# Patient Record
Sex: Female | Born: 1955
Health system: Southern US, Community
[De-identification: ages and names within clinical notes are randomized; demographics above are authoritative.]

## PROBLEM LIST (undated history)

## (undated) DIAGNOSIS — R112 Nausea with vomiting, unspecified: Secondary | ICD-10-CM

## (undated) DIAGNOSIS — Z9889 Other specified postprocedural states: Secondary | ICD-10-CM

## (undated) DIAGNOSIS — F329 Major depressive disorder, single episode, unspecified: Secondary | ICD-10-CM

## (undated) DIAGNOSIS — R519 Headache, unspecified: Secondary | ICD-10-CM

## (undated) DIAGNOSIS — I10 Essential (primary) hypertension: Secondary | ICD-10-CM

## (undated) DIAGNOSIS — F32A Depression, unspecified: Secondary | ICD-10-CM

## (undated) DIAGNOSIS — E119 Type 2 diabetes mellitus without complications: Secondary | ICD-10-CM

## (undated) DIAGNOSIS — G4730 Sleep apnea, unspecified: Secondary | ICD-10-CM

## (undated) DIAGNOSIS — K219 Gastro-esophageal reflux disease without esophagitis: Secondary | ICD-10-CM

## (undated) DIAGNOSIS — D649 Anemia, unspecified: Secondary | ICD-10-CM

## (undated) DIAGNOSIS — M1990 Unspecified osteoarthritis, unspecified site: Secondary | ICD-10-CM

## (undated) DIAGNOSIS — F419 Anxiety disorder, unspecified: Secondary | ICD-10-CM

## (undated) DIAGNOSIS — T8859XA Other complications of anesthesia, initial encounter: Secondary | ICD-10-CM

## (undated) HISTORY — DX: Essential (primary) hypertension: I10

## (undated) HISTORY — PX: BREAST REDUCTION SURGERY: SHX8

## (undated) HISTORY — PX: COLONOSCOPY: SHX174

## (undated) HISTORY — DX: Type 2 diabetes mellitus without complications: E11.9

## (undated) HISTORY — DX: Sleep apnea, unspecified: G47.30

## (undated) HISTORY — PX: COSMETIC SURGERY: SHX468

## (undated) HISTORY — PX: LAPAROSCOPY: SHX197

## (undated) HISTORY — DX: Depression, unspecified: F32.A

## (undated) HISTORY — DX: Major depressive disorder, single episode, unspecified: F32.9

## (undated) HISTORY — PX: TUBAL LIGATION: SHX77

---

## 1989-01-28 HISTORY — PX: REDUCTION MAMMAPLASTY: SUR839

## 2001-05-14 ENCOUNTER — Other Ambulatory Visit: Admission: RE | Admit: 2001-05-14 | Discharge: 2001-05-14 | Payer: Self-pay | Admitting: Obstetrics and Gynecology

## 2002-05-17 ENCOUNTER — Other Ambulatory Visit: Admission: RE | Admit: 2002-05-17 | Discharge: 2002-05-17 | Payer: Self-pay | Admitting: Obstetrics and Gynecology

## 2003-05-26 ENCOUNTER — Other Ambulatory Visit: Admission: RE | Admit: 2003-05-26 | Discharge: 2003-05-26 | Payer: Self-pay | Admitting: Obstetrics and Gynecology

## 2004-05-28 ENCOUNTER — Other Ambulatory Visit: Admission: RE | Admit: 2004-05-28 | Discharge: 2004-05-28 | Payer: Self-pay | Admitting: Obstetrics and Gynecology

## 2005-05-30 ENCOUNTER — Other Ambulatory Visit: Admission: RE | Admit: 2005-05-30 | Discharge: 2005-05-30 | Payer: Self-pay | Admitting: Obstetrics and Gynecology

## 2006-01-28 HISTORY — PX: ABDOMINAL HYSTERECTOMY: SHX81

## 2006-07-03 ENCOUNTER — Other Ambulatory Visit: Admission: RE | Admit: 2006-07-03 | Discharge: 2006-07-03 | Payer: Self-pay | Admitting: Obstetrics and Gynecology

## 2006-08-11 ENCOUNTER — Ambulatory Visit (HOSPITAL_COMMUNITY): Admission: RE | Admit: 2006-08-11 | Discharge: 2006-08-12 | Payer: Self-pay | Admitting: Obstetrics and Gynecology

## 2006-08-11 ENCOUNTER — Encounter: Payer: Self-pay | Admitting: Obstetrics and Gynecology

## 2007-07-06 ENCOUNTER — Other Ambulatory Visit: Admission: RE | Admit: 2007-07-06 | Discharge: 2007-07-06 | Payer: Self-pay | Admitting: Obstetrics and Gynecology

## 2008-08-04 LAB — HM DEXA SCAN: HM Dexa Scan: NORMAL

## 2008-09-02 ENCOUNTER — Other Ambulatory Visit: Admission: RE | Admit: 2008-09-02 | Discharge: 2008-09-02 | Payer: Self-pay | Admitting: Obstetrics and Gynecology

## 2008-09-02 ENCOUNTER — Encounter: Payer: Self-pay | Admitting: Obstetrics and Gynecology

## 2008-09-02 ENCOUNTER — Ambulatory Visit: Payer: Self-pay | Admitting: Obstetrics and Gynecology

## 2009-09-04 ENCOUNTER — Ambulatory Visit: Payer: Self-pay | Admitting: Obstetrics and Gynecology

## 2009-09-04 ENCOUNTER — Emergency Department (HOSPITAL_COMMUNITY): Admission: EM | Admit: 2009-09-04 | Discharge: 2009-09-04 | Payer: Self-pay | Admitting: Emergency Medicine

## 2009-09-04 ENCOUNTER — Other Ambulatory Visit: Admission: RE | Admit: 2009-09-04 | Discharge: 2009-09-04 | Payer: Self-pay | Admitting: Obstetrics and Gynecology

## 2009-09-04 LAB — HM PAP SMEAR: HM Pap smear: NORMAL

## 2009-09-07 ENCOUNTER — Ambulatory Visit: Payer: Self-pay | Admitting: Family Medicine

## 2009-09-22 ENCOUNTER — Ambulatory Visit: Payer: Self-pay | Admitting: Internal Medicine

## 2009-10-11 ENCOUNTER — Ambulatory Visit: Payer: Self-pay | Admitting: Internal Medicine

## 2009-10-31 LAB — HM MAMMOGRAPHY: HM Mammogram: NORMAL

## 2010-02-03 ENCOUNTER — Ambulatory Visit: Payer: Self-pay | Admitting: Internal Medicine

## 2010-06-12 NOTE — Op Note (Signed)
[name], [name]             ACCOUNT NO.:  [id number]   MEDICAL RECORD NO.:  [id number]          PATIENT TYPE:  AMB   LOCATION:  SDC                           FACILITY:  [facility]   PHYSICIAN:  Daniel L. Gottsegen, M.D.DATE OF BIRTH:  03/26/1955   DATE OF PROCEDURE:  08/11/2006  DATE OF DISCHARGE:                               OPERATIVE REPORT   PREOPERATIVE DIAGNOSIS:  Menorrhagia with dysmenorrhea, anemia,  adenomyosis suspected.   POSTOPERATIVE DIAGNOSIS:  Menorrhagia with dysmenorrhea, anemia,  adenomyosis suspected.   OPERATIONS:  Vaginal hysterectomy.   SURGEONS:  Dr. Eda Paschal   FIRST ASSISTANT:  Dr. Audie Box   FINDINGS AT SURGERY:  The patient's uterus was boggy and large without  any specific pathology.  Once the uterus was removed pelvic peritoneum  was free of any disease.  The ovaries were very well suspended, they  could not be seen well but what could be seen on both of them they were  normal and she had normal preoperative ultrasound.   SPECIMENS:  Cervix and uterus were the specimens and were sent to  pathology for tissue diagnosis.   PROCEDURE:  After adequate general orotracheal anesthesia the patient  was placed in the dorsal lithotomy position, prepped and draped in usual  sterile manner.  A 1:200,000 solution of epinephrine was injected around  the cervix.  A 360 degrees incision was made around the cervix.  The  bladder was mobilized superiorly as was the posterior peritoneum.  Posterior peritoneum and vesicouterine fold of peritoneum were entered  with sharp dissection without incident.  The uterosacral ligaments were  clamped, in clamping them they were shortened and then they were sutured  to the vault laterally for good vault support.  Cardinal ligament,  uterine arteries, balance of the broad ligament, utero-ovarian  ligaments, round ligaments and fallopian tubes were all successfully  clamped, cut and suture ligated. #1 chromic catgut was utilized  as  suture material. Major vascular bundles were doubly ligated.  The uterus  was delivered and sent to pathology for tissue diagnosis.  The vaginal  cuff was whip stitched with a running locking #1 chromic catgut for  hemostasis.  There was no need for cul-de-sac suture as the patient had  a very narrow cul-de-sac. The vaginal cuff and the peritoneum were  closed with figure-of-eights of #1 chromic catgut.  Copious irrigation  of been done with sterile saline.  Two sponge, needle, instrument counts  were correct. At the termination of procedure an in and out cath was  done the patient had 300 mL of clear urine.  Blood loss entire procedure  was less than 50 mL with none replaced.  The patient tolerated procedure  well and left the operating room in satisfactory condition.     Daniel L. Eda Paschal, M.D.  Electronically Signed    DLG/MEDQ  D:  08/11/2006  T:  08/11/2006  Job:  [id number]

## 2010-06-12 NOTE — H&P (Signed)
[name], [name]             ACCOUNT NO.:  [id number]   MEDICAL RECORD NO.:  [id number]          PATIENT TYPE:  OIB   LOCATION:  9315                          FACILITY:  [facility]   PHYSICIAN:  Daniel L. Gottsegen, M.D.DATE OF BIRTH:  [birth year]   DATE OF ADMISSION:  08/11/2006  DATE OF DISCHARGE:                              HISTORY & PHYSICAL   CHIEF COMPLAINT:  Menometrorrhagia with dysmenorrhea and anemia.   HISTORY OF PRESENT ILLNESS:  The patient is a 55 year old gravida 1,  para 1, AB 0 who over the past several years has had a terrible periods.  She has very heavy flooding with clotting.  She has severe dysmenorrhea.  She is anemic as a result of the above. She has tried oral  contraceptives for control that and has not controlled that. She has  tried [place] IUD and that also has been unsuccessful in controlling the  above.  She had negative saline infusion histogram which showed no  evidence of intrauterine pathology.  She has been on iron yet remains  anemic due to the above.  She now enters the hospital for removal of her  uterus vaginally. We have discussed pros and cons of removing her  ovaries and she is elected to keep them after informed consent.   PAST MEDICAL HISTORY:  The patient has a history of migraines.  She  takes Imitrex for that. The previous surgery includes tubal ligation,  breast reduction and previous diagnostic laparoscopy.   PRESENT MEDICATIONS:  Are Imitrex, naproxen and Wellbutrin 150 mg daily,  Ambien, YAZ, oxycodone and Percocet.   ALLERGIES:  She is allergic to no drugs.   FAMILY HISTORY:  Reveals that her daughter is diabetic.  Grandmother and  father hypertensive, father has had colon cancer.  Grandmother has  coronary artery disease and maternal great aunt has breast cancer.   REVIEW OF SYSTEMS:  HEENT: Reveals the history of headaches on Imitrex.  CARDIOVASCULAR:  Is negative.  RESPIRATORY:  Negative.  GI: Reveals a  history of GERD and  hemorrhoids.  MUSCULOSKELETAL:  Negative.  GU is  negative.  NEUROLOGICAL: PSYCHIATRIC:  Reveals a history of depression.  ALLERGIC/HEMATOLOGIC/LYMPHATIC/ENDOCRINE:  Reveals a history of anemia  secondary to menorrhagia.   PHYSICAL EXAMINATION:  GENERAL:  The patient is a well-developed, well-  nourished female in no acute distress.  VITAL SIGNS:  Her blood pressure is 140/90, her pulse is a regular,  respirations 16 nonlabored.  She is afebrile.  HEENT:  Within normal limits.  NECK:  Supple.  Trachea midline.  Thyroid is not enlarged.  LUNGS:  Clear to IPP&A.  HEART:  No thrills, heaves, or murmurs.  BREASTS: No masses.  ABDOMEN:  Soft without guarding, rebound or masses.  PELVIC: External is normal.  BUS is normal.  Vaginal is normal.  Cervix  is clean.  Last Pap smear done within a  month showed no atypia.  Uterus is top normal size and shape consistent  with adenomyosis.  Adnexa failed to reveal masses.  Rectovaginal is  negative.   IMPRESSION:  Menometrorrhagia with anemia, dysmenorrhea, adenomyosis  strongly suspected.  Daniel L. Eda Paschal, M.D.  Electronically Signed     DLG/MEDQ  D:  08/11/2006  T:  08/11/2006  Job:  [id number]

## 2010-06-12 NOTE — Op Note (Signed)
NAMEALARA, [name]             ACCOUNT NO.:  [id number]   MEDICAL RECORD NO.:  [id number]          PATIENT TYPE:  OIB   LOCATION:  9315                          [facility]ACILITY:  [facility]   PHYSICIAN:  [name] L. Gottsegen, M.D.DATE OF BIRTH:  09/30/1955   DATE OF PROCEDURE:  08/11/2006  DATE OF DISCHARGE:                               OPERATIVE REPORT   PREOPERATIVE DIAGNOSES:  1. Menometrorrhagia with anemia.  2. Dysmenorrhea.  3. Adenomyosis suspected.   POSTOPERATIVE DIAGNOSES:  1. Menometrorrhagia with anemia.  2. Dysmenorrhea.  3. Adenomyosis suspected.   OPERATION:  Vaginal hysterectomy.   SURGEON:  Dr. Eda Paschal   Dictation ends here.      [name] L. Eda Paschal, M.D.     [name]  D:  08/11/2006  T:  08/11/2006  Job:  [id number]

## 2010-06-15 NOTE — Discharge Summary (Signed)
NAMEVIVIEN, [name]             ACCOUNT NO.:  [id number]   MEDICAL RECORD NO.:  [id number]          PATIENT TYPE:  OIB   LOCATION:  9315                          [facility]ACILITY:  [facility]   PHYSICIAN:  Daniel L. Gottsegen, M.D.DATE OF BIRTH:  1955-03-31   DATE OF ADMISSION:  08/11/2006  DATE OF DISCHARGE:  08/12/2006                               DISCHARGE SUMMARY   The patient is a 55 year old female who was admitted to the hospital  with menometrorrhagia, dysmenorrhea and anemia due to the  menometrorrhagia for definitive surgery.  On the day of admission she  was taken to the operating room.  A vaginal hysterectomy was performed.  Surgery went without complications.  By the first postoperative day the  patient was ready for discharge.  She was discharged on hydrocodone for  pain relief.  She was instructed not to drive for 2 weeks, not to lift  for 4 weeks.  She will return to see me in the office in 3 weeks.   LABORATORY DATA:  The patient was anemic with a hemoglobin of 10.9.  The  rest of the CBC was normal except for the anemia.  Urinalysis was  normal.  Final pathology report revealed the uterine cervix with benign  ectocervical and endocervical mucosa, uterine corpus, benign  proliferative endometrium, adenomyosis, intramural leiomyoma.   CONDITION ON DISCHARGE:  Improved.   DISCHARGE DIAGNOSES:  1. Menometrorrhagia with leiomyoma uteri and adenomyosis.   OPERATION:  Vaginal hysterectomy.      Daniel L. Eda Paschal, M.D.  Electronically Signed     DLG/MEDQ  D:  09/01/2006  T:  09/01/2006  Job:  [id number]

## 2010-09-06 ENCOUNTER — Encounter: Payer: Self-pay | Admitting: Internal Medicine

## 2010-09-11 ENCOUNTER — Encounter: Payer: Self-pay | Admitting: Internal Medicine

## 2010-09-18 ENCOUNTER — Telehealth: Payer: Self-pay | Admitting: Internal Medicine

## 2010-09-18 MED ORDER — METOPROLOL SUCCINATE ER 25 MG PO TB24: 25.0000 mg | ORAL_TABLET | Freq: Every day | ORAL | Status: DC

## 2010-09-18 NOTE — Telephone Encounter (Signed)
Patient called and stated she wanted to change her bystolic to something cheaper b/c the Bystolic is too expensive.to something cheaper.

## 2010-09-18 NOTE — Telephone Encounter (Signed)
We can try Metoprolol XL 25mg  by mouth daily, disp 30 with 3 refill. She should follow up in 1 month or earlier if any concerns or if BP>140/90

## 2010-09-21 NOTE — Telephone Encounter (Signed)
Informed patient that if she has any concerns about BP  To call and to f/up in one month.  Called in Rx.

## 2010-09-21 NOTE — Telephone Encounter (Signed)
Left message for patient to return my call.

## 2010-10-18 ENCOUNTER — Other Ambulatory Visit: Payer: Self-pay | Admitting: Internal Medicine

## 2010-10-18 MED ORDER — LOSARTAN POTASSIUM-HCTZ 100-12.5 MG PO TABS: 1.0000 | ORAL_TABLET | Freq: Every day | ORAL | Status: DC

## 2010-11-08 ENCOUNTER — Encounter: Payer: Self-pay | Admitting: Internal Medicine

## 2010-11-08 ENCOUNTER — Ambulatory Visit (INDEPENDENT_AMBULATORY_CARE_PROVIDER_SITE_OTHER): Payer: BC Managed Care – PPO | Admitting: Internal Medicine

## 2010-11-08 VITALS — BP 152/100 | HR 81 | Temp 98.0°F | Resp 16 | Ht 65.0 in | Wt 186.0 lb

## 2010-11-08 DIAGNOSIS — F329 Major depressive disorder, single episode, unspecified: Secondary | ICD-10-CM

## 2010-11-08 DIAGNOSIS — Z0000 Encounter for general adult medical examination without abnormal findings: Secondary | ICD-10-CM

## 2010-11-08 DIAGNOSIS — I10 Essential (primary) hypertension: Secondary | ICD-10-CM | POA: Insufficient documentation

## 2010-11-08 DIAGNOSIS — R1032 Left lower quadrant pain: Secondary | ICD-10-CM

## 2010-11-08 DIAGNOSIS — F32A Depression, unspecified: Secondary | ICD-10-CM

## 2010-11-08 DIAGNOSIS — G43909 Migraine, unspecified, not intractable, without status migrainosus: Secondary | ICD-10-CM

## 2010-11-08 DIAGNOSIS — E669 Obesity, unspecified: Secondary | ICD-10-CM

## 2010-11-08 LAB — CBC WITH DIFFERENTIAL/PLATELET
Basophils Relative: 0.4 % (ref 0.0–3.0)
Eosinophils Absolute: 0.1 10*3/uL (ref 0.0–0.7)
HCT: 39.8 % (ref 36.0–46.0)
Lymphocytes Relative: 24.4 % (ref 12.0–46.0)
Lymphs Abs: 1.6 10*3/uL (ref 0.7–4.0)
Monocytes Absolute: 0.4 10*3/uL (ref 0.1–1.0)
Neutro Abs: 4.6 10*3/uL (ref 1.4–7.7)
RDW: 12.5 % (ref 11.5–14.6)
WBC: 6.7 10*3/uL (ref 4.5–10.5)

## 2010-11-08 LAB — COMPREHENSIVE METABOLIC PANEL
Alkaline Phosphatase: 110 U/L (ref 39–117)
CO2: 26 mEq/L (ref 19–32)
Calcium: 9.1 mg/dL (ref 8.4–10.5)
Chloride: 104 mEq/L (ref 96–112)
GFR: 124.82 mL/min (ref >60.00)
Glucose, Bld: 107 mg/dL — ABNORMAL HIGH (ref 70–99)
Potassium: 4.6 mEq/L (ref 3.5–5.1)
Sodium: 140 mEq/L (ref 135–145)

## 2010-11-08 LAB — LIPID PANEL: Triglycerides: 129 mg/dL (ref 0.0–149.0)

## 2010-11-08 LAB — LDL CHOLESTEROL, DIRECT: Direct LDL: 161.1 mg/dL

## 2010-11-08 MED ORDER — CITALOPRAM HYDROBROMIDE 40 MG PO TABS: 40.0000 mg | ORAL_TABLET | Freq: Every day | ORAL | Status: DC

## 2010-11-08 MED ORDER — CARVEDILOL 6.25 MG PO TABS: 6.2500 mg | ORAL_TABLET | Freq: Two times a day (BID) | ORAL | Status: DC

## 2010-11-08 MED ORDER — LOSARTAN POTASSIUM-HCTZ 100-12.5 MG PO TABS: 1.0000 | ORAL_TABLET | Freq: Every day | ORAL | Status: DC

## 2010-11-08 MED ORDER — SUMATRIPTAN SUCCINATE 100 MG PO TABS: 100.0000 mg | ORAL_TABLET | Freq: Every day | ORAL | Status: DC | PRN

## 2010-11-08 MED ORDER — SUMATRIPTAN SUCCINATE 6 MG/0.5ML ~~LOC~~ SOLN: 6.0000 mg | SUBCUTANEOUS | Status: DC | PRN

## 2010-11-08 NOTE — Patient Instructions (Signed)
Continue to record Blood Pressure. Call if consistently over 140/90.

## 2010-11-08 NOTE — Progress Notes (Signed)
Subjective:    Patient ID: [name], female    DOB: 07-08-1955, 55 y.o.   MRN: [id number]  HPI [name] is a 55 year old female with a history of hypertension who presents for followup. Her primary concern today is elevated blood pressure. She reports that she did very well when taking bysytolic, however she has developed some recurrent palpitations and elevated blood pressure with use of metoprolol. In the past, she has not been able to tolerate higher doses of metoprolol. She reports significant fatigue and some lower extremity swelling with this medication.  [name] also notes recent episodes of lower abdominal pain. This is described as pressure similar to menstrual cramps. It seems to be more localized to the left lower abdomen. She has no associated diarrhea or constipation. She denies any vomiting. She does have some chronic bright red blood with bowel movements secondary to hemorrhoids. She denies any fever or chills. She denies any dysuria. She had a hysterectomy in the past but ovaries are still in place.  [name] is also concerned about her weight. She reports that she has limited her caloric intake and has increased her physical activity with walking but has not been able to lose weight.  Outpatient Encounter Prescriptions as of 11/08/2010  Medication Sig Dispense Refill  . citalopram (CELEXA) 40 MG tablet Take 1 tablet (40 mg total) by mouth daily.  30 tablet  6  . Cyanocobalamin (VITAMIN B-12) 1000 MCG SUBL Place 1 tablet under the tongue daily.        Marland Kitchen losartan-hydrochlorothiazide (HYZAAR) 100-12.5 MG per tablet Take 1 tablet by mouth daily.  30 tablet  6  . Multiple Vitamin (MULTIVITAMIN) capsule Take 1 capsule by mouth daily.        . naproxen (NAPROSYN) 500 MG tablet Take 500 mg by mouth daily as needed.        . SUMAtriptan (IMITREX) 100 MG tablet Take 1 tablet (100 mg total) by mouth daily as needed.  10 tablet  6  . Turmeric 500 MG CAPS Take 1 tablet by  mouth daily.        Marland Kitchen zolpidem (AMBIEN) 10 MG tablet Take 10 mg by mouth at bedtime as needed. Takes 1/2 tablet at bedtime.      Marland Kitchen DISCONTD: citalopram (CELEXA) 40 MG tablet Take 40 mg by mouth daily.        Marland Kitchen DISCONTD: losartan-hydrochlorothiazide (HYZAAR) 100-12.5 MG per tablet Take 1 tablet by mouth daily.  30 tablet  6  . DISCONTD: metoprolol succinate (TOPROL-XL) 25 MG 24 hr tablet Take 1 tablet (25 mg total) by mouth daily.  30 tablet  1     Review of Systems  Constitutional: Negative for fever, chills, appetite change, fatigue and unexpected weight change.  HENT: Negative for ear pain, congestion, sore throat, trouble swallowing, neck pain, voice change and sinus pressure.   Eyes: Negative for visual disturbance.  Respiratory: Negative for cough, shortness of breath, wheezing and stridor.   Cardiovascular: Positive for palpitations. Negative for chest pain and leg swelling.  Gastrointestinal: Positive for abdominal pain. Negative for nausea, vomiting, diarrhea, constipation, blood in stool, abdominal distention and anal bleeding.  Genitourinary: Negative for dysuria and flank pain.  Musculoskeletal: Negative for myalgias, arthralgias and gait problem.  Skin: Negative for color change and rash.  Neurological: Negative for dizziness and headaches.  Hematological: Negative for adenopathy. Does not bruise/bleed easily.  Psychiatric/Behavioral: Negative for suicidal ideas, sleep disturbance and dysphoric mood. The patient is not nervous/anxious.  BP 152/100  Pulse 81  Temp(Src) 98 F (36.7 C) (Oral)  Resp 16  Ht 5\' 5"  (1.651 m)  Wt 186 lb (84.369 kg)  BMI 30.95 kg/m2  SpO2 96%     Objective:   Physical Exam  Constitutional: She is oriented to person, place, and time. She appears well-developed and well-nourished. No distress.  HENT:  Head: Normocephalic and atraumatic.  Right Ear: External ear normal.  Left Ear: External ear normal.  Nose: Nose normal.  Mouth/Throat:  Oropharynx is clear and moist. No oropharyngeal exudate.  Eyes: Conjunctivae are normal. Pupils are equal, round, and reactive to light. Right eye exhibits no discharge. Left eye exhibits no discharge. No scleral icterus.  Neck: Normal range of motion. Neck supple. No tracheal deviation present. No thyromegaly present.  Cardiovascular: Normal rate, regular rhythm, normal heart sounds and intact distal pulses.  Exam reveals no gallop and no friction rub.   No murmur heard. Pulmonary/Chest: Effort normal and breath sounds normal. No respiratory distress. She has no wheezes. She has no rales. She exhibits no tenderness.  Abdominal: Soft. She exhibits no distension and no mass. There is tenderness (left lower quadrant). There is no rebound and no guarding.  Musculoskeletal: Normal range of motion. She exhibits no edema and no tenderness.  Lymphadenopathy:    She has no cervical adenopathy.  Neurological: She is alert and oriented to person, place, and time. No cranial nerve deficit. She exhibits normal muscle tone. Coordination normal.  Skin: Skin is warm and dry. No rash noted. She is not diaphoretic. No erythema. No pallor.  Psychiatric: She has a normal mood and affect. Her behavior is normal. Judgment and thought content normal.          Assessment & Plan:  1. Hypertension -blood pressure elevated today. Will try changing to carvedilol. Patient will start with 6.25 mg twice daily. She will record blood pressure daily. She will followup in 2-4 weeks.  2. Palpitations -patient with palpitations that had improved on bystolic. As above, will try changing to carvedilol. EKG was normal today.  3. Abdominal pain left lower quadrant - patient with pain in the left lower abdomen. Symptoms are concerning for diverticulitis or ovarian abnormality. Recommended to get a CT of the abdomen. Patient would prefer to check with her insurance company to ensure that the surface is covered prior to scheduling.  She will followup with [place] in regards to coverage and if covered we will schedule.  4. Obesity - BMI 30. Patient obese. Encouraged her to keep a food diary and set goal daily caloric intake at 1000-1200 calories per day. Encouraged exercise 30-60 minutes most days of the week. We will followup in 4 weeks.

## 2010-11-13 LAB — URINALYSIS, ROUTINE W REFLEX MICROSCOPIC
Ketones, ur: NEGATIVE
Leukocytes, UA: NEGATIVE
Nitrite: NEGATIVE
Protein, ur: NEGATIVE
Specific Gravity, Urine: <1.005 — ABNORMAL LOW
pH: 6

## 2010-11-13 LAB — CBC
Hemoglobin: 10.9 — ABNORMAL LOW
MCHC: 32.6
MCV: 75.2 — ABNORMAL LOW
RBC: 4.45
RDW: 15.8 — ABNORMAL HIGH

## 2010-11-13 LAB — URINE MICROSCOPIC-ADD ON

## 2010-11-13 LAB — HCG, SERUM, QUALITATIVE: Preg, Serum: NEGATIVE

## 2010-11-23 ENCOUNTER — Ambulatory Visit: Payer: Self-pay | Admitting: Internal Medicine

## 2010-11-27 ENCOUNTER — Other Ambulatory Visit: Payer: Self-pay | Admitting: Internal Medicine

## 2010-11-27 DIAGNOSIS — I10 Essential (primary) hypertension: Secondary | ICD-10-CM

## 2010-11-27 MED ORDER — LOSARTAN POTASSIUM-HCTZ 100-12.5 MG PO TABS: 1.0000 | ORAL_TABLET | Freq: Every day | ORAL | Status: DC

## 2010-12-03 ENCOUNTER — Ambulatory Visit: Payer: Self-pay | Admitting: Internal Medicine

## 2010-12-05 ENCOUNTER — Telehealth: Payer: Self-pay | Admitting: Internal Medicine

## 2010-12-05 NOTE — Telephone Encounter (Signed)
Pt called wanted to see if you have the results to her mammogram one last Friday and Monday she had done one in [place] and [place]   They called her from [facility] to have an ultrasound on Friday.  patient has some question. about this

## 2010-12-05 NOTE — Telephone Encounter (Signed)
I have not seen these mammograms. Can you call and ask the hospital to fax over?

## 2010-12-06 ENCOUNTER — Telehealth: Payer: Self-pay | Admitting: Internal Medicine

## 2010-12-06 ENCOUNTER — Ambulatory Visit: Payer: Self-pay | Admitting: Internal Medicine

## 2010-12-06 NOTE — Telephone Encounter (Signed)
Spoke w/patient - She had mammogram, told it was normal at visit then called back 2 days later for u/s. They told her that u/s was normal but she "might need to f/u in 6 mths". She was very concerned. I explained that any time there was an abnormal finding and u/s was required that f/u needed to be earlier than the normal one year. She understands and feels better, pt will discuss further with MD at [facility] next week.

## 2010-12-06 NOTE — Telephone Encounter (Signed)
It would be helpful to have the report on the follow up mammogram and [place].  Can you please try to get these faxed over. Thanks

## 2010-12-06 NOTE — Telephone Encounter (Signed)
Faxed request to get the results to [facility]

## 2010-12-06 NOTE — Telephone Encounter (Signed)
Report received, I called patient back to inform her that u/s was negative and 6 mth f/u was suggested. She will discuss further with you at [facility].

## 2010-12-06 NOTE — Telephone Encounter (Signed)
Mammogram was abnormal 10/26. Pt had additional views on 11/5, but this is not yet available (not yet read by radiology). She called asking for results yesterday, so I checked today. Please let her know we don't have them.

## 2010-12-12 ENCOUNTER — Encounter: Payer: Self-pay | Admitting: Internal Medicine

## 2010-12-12 ENCOUNTER — Ambulatory Visit (INDEPENDENT_AMBULATORY_CARE_PROVIDER_SITE_OTHER): Payer: BC Managed Care – PPO | Admitting: Internal Medicine

## 2010-12-12 DIAGNOSIS — R928 Other abnormal and inconclusive findings on diagnostic imaging of breast: Secondary | ICD-10-CM

## 2010-12-12 DIAGNOSIS — I10 Essential (primary) hypertension: Secondary | ICD-10-CM

## 2010-12-12 DIAGNOSIS — F329 Major depressive disorder, single episode, unspecified: Secondary | ICD-10-CM | POA: Insufficient documentation

## 2010-12-12 DIAGNOSIS — F32A Depression, unspecified: Secondary | ICD-10-CM | POA: Insufficient documentation

## 2010-12-12 MED ORDER — BUPROPION HCL ER (XL) 150 MG PO TB24: 150.0000 mg | ORAL_TABLET | ORAL | Status: DC

## 2010-12-12 MED ORDER — NEBIVOLOL HCL 10 MG PO TABS: 10.0000 mg | ORAL_TABLET | Freq: Every day | ORAL | Status: DC

## 2010-12-12 NOTE — Progress Notes (Signed)
Subjective:    Patient ID: [name], female    DOB: July 27, 1955, 55 y.o.   MRN: [id number]  HPI 55 year old female with a history of hypertension and depression presents for followup. In regards to her hypertension, we recently changed her from Bystolic to carvedilol. She notes that she has increased frequency of headaches on carvedilol. She also reports that her blood pressures, in particular her diastolic blood pressures, have been higher on carvedilol. She brings a record of her blood pressures today and most are near 140/90. She would like to go back to [facility].  In regards to her depression, she reports that her symptoms including depression and some chronic mild pain were best controlled when she was taking Cymbalta. However, her use of Cymbalta was limited because of nausea. She is now taking Celexa and finds that she is frequently tearful. She does not feel that this medication is adequately controlling her symptoms. She would like to try a new medication.  She also notes a recent history of an abnormal mammogram for which she was brought back to the hospital for additional views and ultrasound of the breast. She notes that she has been told now to followup on a yearly basis for mammograms as the findings were all consistent with normal tissue.  Outpatient Encounter Prescriptions as of 12/12/2010  Medication Sig Dispense Refill  . citalopram (CELEXA) 40 MG tablet Take 1 tablet (40 mg total) by mouth daily.  30 tablet  6  . Cyanocobalamin (VITAMIN B-12) 1000 MCG SUBL Place 1 tablet under the tongue daily.        Marland Kitchen losartan-hydrochlorothiazide (HYZAAR) 100-12.5 MG per tablet Take 1 tablet by mouth daily.  30 tablet  6  . Multiple Vitamin (MULTIVITAMIN) capsule Take 1 capsule by mouth daily.        . naproxen (NAPROSYN) 500 MG tablet Take 500 mg by mouth daily as needed.        . nebivolol (BYSTOLIC) 10 MG tablet Take 1 tablet (10 mg total) by mouth daily.  30 tablet  3  . SUMAtriptan  (IMITREX) 100 MG tablet Take 1 tablet (100 mg total) by mouth daily as needed.  10 tablet  6  . SUMAtriptan (IMITREX) 6 MG/0.5ML SOLN Inject 0.5 mLs (6 mg total) into the skin every 2 (two) hours as needed.  0.5 mL  6  . Turmeric 500 MG CAPS Take 1 tablet by mouth daily.        Marland Kitchen zolpidem (AMBIEN) 10 MG tablet Take 5 mg by mouth at bedtime as needed.         Review of Systems  Constitutional: Negative for fever, chills, appetite change, fatigue and unexpected weight change.  HENT: Negative for ear pain, congestion, sore throat, trouble swallowing, neck pain, voice change and sinus pressure.   Eyes: Negative for visual disturbance.  Respiratory: Negative for cough, shortness of breath, wheezing and stridor.   Cardiovascular: Negative for chest pain, palpitations and leg swelling.  Gastrointestinal: Negative for nausea, vomiting, abdominal pain, diarrhea, constipation, blood in stool, abdominal distention and anal bleeding.  Genitourinary: Negative for dysuria and flank pain.  Musculoskeletal: Negative for myalgias, arthralgias and gait problem.  Skin: Negative for color change and rash.  Neurological: Positive for headaches. Negative for dizziness.  Hematological: Negative for adenopathy. Does not bruise/bleed easily.  Psychiatric/Behavioral: Positive for dysphoric mood. Negative for suicidal ideas and sleep disturbance. The patient is not nervous/anxious.    BP 132/80  Pulse 81  Temp(Src) 97.7 F (36.5 C) (  Oral)  Wt 183 lb (83.008 kg)  SpO2 97%     Objective:   Physical Exam  Constitutional: She is oriented to person, place, and time. She appears well-developed and well-nourished. No distress.  HENT:  Head: Normocephalic and atraumatic.  Right Ear: External ear normal.  Left Ear: External ear normal.  Nose: Nose normal.  Mouth/Throat: Oropharynx is clear and moist. No oropharyngeal exudate.  Eyes: Conjunctivae are normal. Pupils are equal, round, and reactive to light. Right eye  exhibits no discharge. Left eye exhibits no discharge. No scleral icterus.  Neck: Normal range of motion. Neck supple. No tracheal deviation present. No thyromegaly present.  Cardiovascular: Normal rate, regular rhythm, normal heart sounds and intact distal pulses.  Exam reveals no gallop and no friction rub.   No murmur heard. Pulmonary/Chest: Effort normal and breath sounds normal. No respiratory distress. She has no wheezes. She has no rales. She exhibits no tenderness.  Musculoskeletal: Normal range of motion. She exhibits no edema and no tenderness.  Lymphadenopathy:    She has no cervical adenopathy.  Neurological: She is alert and oriented to person, place, and time. No cranial nerve deficit. She exhibits normal muscle tone. Coordination normal.  Skin: Skin is warm and dry. No rash noted. She is not diaphoretic. No erythema. No pallor.  Psychiatric: Her behavior is normal. Judgment and thought content normal. She exhibits a depressed mood.          Assessment & Plan:  1. Hypertension - will discontinue carvedilol and start Bystolic. Patient will continue to record blood pressures and will return to clinic in one month.  2. Depression -will try adding Wellbutrin to Celexa. Patient will return to clinic in one month for reevaluation.  3. Abnormal mammogram - additional views and ultrasound imaging were normal. The patient will be scheduled for follow up mammogram in 6 months.

## 2010-12-17 ENCOUNTER — Encounter: Payer: Self-pay | Admitting: Internal Medicine

## 2010-12-19 ENCOUNTER — Encounter: Payer: Self-pay | Admitting: Internal Medicine

## 2010-12-25 ENCOUNTER — Telehealth: Payer: Self-pay | Admitting: *Deleted

## 2010-12-25 DIAGNOSIS — I10 Essential (primary) hypertension: Secondary | ICD-10-CM

## 2010-12-25 MED ORDER — NEBIVOLOL HCL 10 MG PO TABS: 10.0000 mg | ORAL_TABLET | Freq: Every day | ORAL | Status: DC

## 2010-12-25 NOTE — Telephone Encounter (Signed)
Sopke w/patient. She was confused about what BP meds she was supposed to be taking. She stopped taking the hyzaar and only was taking bystolic. She will resume hyzaar and also take bystolic. I left samples for patient to pick up and advised her to continue to monitor and report readings back to the office.  BP readings 146/93 - 11/18 163/100 120/78 146/93 137/86 157/99 170/100 142/89 167/101 - Today

## 2010-12-25 NOTE — Telephone Encounter (Signed)
Agree 

## 2011-03-11 ENCOUNTER — Telehealth: Payer: Self-pay | Admitting: Internal Medicine

## 2011-03-11 NOTE — Telephone Encounter (Signed)
[facility] Triage Call Report Triage Record Num: [id number] Operator: Caswell Corwin Patient Name: [name] Call Date & Time: 03/11/2011 11:25:17AM Patient Phone: [phone] PCP: Patient Gender: Female PCP Fax : Patient DOB: 1955/01/31 Practice Name: [facility] Reason for Call: Caller: [name]/Patient; PCP: Ronna Polio; CB#: [phone]; Call regarding Cough/Congestion that started 03/02/11. Afebrile. Triaged URI and has been taking Robitussin Cold and Advil Sinus. All emergent Sx R/O. Pt already ahs an appt for 03/12/11. Home care and call back isnt given. Inst to ck decongestant meds with her HTN. Protocol(s) Used: Upper Respiratory Infection (URI) Recommended Outcome per Protocol: See Provider within 24 hours Reason for Outcome: Mild to moderate headache for more than 24 hours unrelieved with nonprescription medications Care Advice: ~ Use a cool mist humidifier to moisten air. Be sure to clean according to manufacturer's instructions. Call provider if headache worsens, develops temperature greater than 101.59F (38.1C) or any temperature elevation in a geriatric or immunocompromised patient (such as diabetes, HIV/AIDS, chemotherapy, organ transplant, or chronic steroid use). ~ A warm, moist compress placed on face, over eyes for 15 to 20 minutes, 5 to 6 times a day, may help relieve the congestion. ~ Most adults need to drink 6-10 eight-ounce glasses (1.2-2.0 liters) of fluids per day unless previously told to limit fluid intake for other medical reasons. Limit fluids that contain caffeine, sugar or alcohol. Urine will be a very light yellow color when you drink enough fluids. ~ Analgesic/Antipyretic Advice - NSAIDs: Consider aspirin, ibuprofen, naproxen or ketoprofen for pain or fever as directed on label or by pharmacist/provider. PRECAUTIONS: - If over 59 years of age, should not take longer than 1 week without consulting provider. EXCEPTIONS: -  Should not be used if taking blood thinners or have bleeding problems. - Do not use if have history of sensitivity/allergy to any of these medications; or history of cardiovascular, ulcer, kidney, liver disease or diabetes unless approved by provider. - Do not exceed recommended dose or frequency. ~ 03/11/2011 11:38:01AM Page 1 of 1 [id number]

## 2011-03-12 ENCOUNTER — Encounter: Payer: Self-pay | Admitting: Internal Medicine

## 2011-03-12 ENCOUNTER — Ambulatory Visit (INDEPENDENT_AMBULATORY_CARE_PROVIDER_SITE_OTHER): Payer: BC Managed Care – PPO | Admitting: Internal Medicine

## 2011-03-12 VITALS — BP 122/68 | HR 61 | Temp 97.9°F | Ht 65.0 in | Wt 183.0 lb

## 2011-03-12 DIAGNOSIS — J0100 Acute maxillary sinusitis, unspecified: Secondary | ICD-10-CM | POA: Insufficient documentation

## 2011-03-12 MED ORDER — GUAIFENESIN-CODEINE 100-10 MG/5ML PO SYRP: 5.0000 mL | ORAL_SOLUTION | Freq: Two times a day (BID) | ORAL | Status: AC | PRN

## 2011-03-12 MED ORDER — SULFAMETHOXAZOLE-TRIMETHOPRIM 800-160 MG PO TABS: 1.0000 | ORAL_TABLET | Freq: Two times a day (BID) | ORAL | Status: AC

## 2011-03-12 NOTE — Progress Notes (Signed)
Subjective:    Patient ID: [name], female    DOB: [birth year], 56 y.o.   MRN: [id number]  HPI 56 year old female with history of hypertension presents with a two-week history of progressive nasal congestion, sinus pressure, headache. She reports that nasal drainage is purulent. She has occasional cough productive of purulent sputum. She denies any shortness of breath. She denies any recent fever or chills, however had fever and chills initially in the course of her illness. She has been using over-the-counter ibuprofen, phenylephrine, and mucinex with no improvement.  Outpatient Encounter Prescriptions as of 03/12/2011  Medication Sig Dispense Refill  . citalopram (CELEXA) 40 MG tablet Take 1 tablet (40 mg total) by mouth daily.  30 tablet  6  . Cyanocobalamin (VITAMIN B-12) 1000 MCG SUBL Place 1 tablet under the tongue daily.        Marland Kitchen losartan-hydrochlorothiazide (HYZAAR) 100-12.5 MG per tablet Take 1 tablet by mouth daily.  30 tablet  6  . Multiple Vitamin (MULTIVITAMIN) capsule Take 1 capsule by mouth daily.        . naproxen (NAPROSYN) 500 MG tablet Take 500 mg by mouth daily as needed.        . nebivolol (BYSTOLIC) 10 MG tablet Take 1 tablet (10 mg total) by mouth daily.  30 tablet  0  . SUMAtriptan (IMITREX) 100 MG tablet Take 1 tablet (100 mg total) by mouth daily as needed.  10 tablet  6  . SUMAtriptan (IMITREX) 6 MG/0.5ML SOLN Inject 0.5 mLs (6 mg total) into the skin every 2 (two) hours as needed.  0.5 mL  6  . zolpidem (AMBIEN) 10 MG tablet Take 5 mg by mouth at bedtime as needed.       Marland Kitchen DISCONTD: Turmeric 500 MG CAPS Take 1 tablet by mouth daily.        Marland Kitchen guaiFENesin-codeine (ROBITUSSIN AC) 100-10 MG/5ML syrup Take 5 mLs by mouth 2 (two) times daily as needed for cough.  240 mL  0  . sulfamethoxazole-trimethoprim (BACTRIM DS,SEPTRA DS) 800-160 MG per tablet Take 1 tablet by mouth 2 (two) times daily.  20 tablet  0  . DISCONTD: buPROPion (WELLBUTRIN XL) 150 MG 24 hr tablet  Take 1 tablet (150 mg total) by mouth every morning.  30 tablet  2    Review of Systems  Constitutional: Positive for fever and chills. Negative for unexpected weight change.  HENT: Positive for ear pain, congestion, rhinorrhea and postnasal drip. Negative for hearing loss, nosebleeds, sore throat, facial swelling, sneezing, mouth sores, trouble swallowing, neck pain, neck stiffness, voice change, sinus pressure, tinnitus and ear discharge.   Eyes: Negative for pain, discharge, redness and visual disturbance.  Respiratory: Positive for cough. Negative for chest tightness, shortness of breath, wheezing and stridor.   Cardiovascular: Negative for chest pain, palpitations and leg swelling.  Musculoskeletal: Negative for myalgias and arthralgias.  Skin: Negative for color change and rash.  Neurological: Positive for headaches. Negative for dizziness, weakness and light-headedness.  Hematological: Negative for adenopathy.   BP 122/68  Pulse 61  Temp(Src) 97.9 F (36.6 C) (Oral)  Ht 5\' 5"  (1.651 m)  Wt 183 lb (83.008 kg)  BMI 30.45 kg/m2  SpO2 97%     Objective:   Physical Exam  Constitutional: She is oriented to person, place, and time. She appears well-developed and well-nourished. No distress.  HENT:  Head: Normocephalic and atraumatic.  Right Ear: External ear normal. A middle ear effusion is present.  Left Ear: External ear normal. A  middle ear effusion is present.  Nose: Mucosal edema present. Right sinus exhibits maxillary sinus tenderness. Left sinus exhibits maxillary sinus tenderness.  Mouth/Throat: Oropharynx is clear and moist. No oropharyngeal exudate.  Eyes: Conjunctivae are normal. Pupils are equal, round, and reactive to light. Right eye exhibits no discharge. Left eye exhibits no discharge. No scleral icterus.  Neck: Normal range of motion. Neck supple. No tracheal deviation present. No thyromegaly present.  Cardiovascular: Normal rate, regular rhythm, normal heart  sounds and intact distal pulses.  Exam reveals no gallop and no friction rub.   No murmur heard. Pulmonary/Chest: Effort normal and breath sounds normal. No respiratory distress. She has no wheezes. She has no rales. She exhibits no tenderness.  Musculoskeletal: Normal range of motion. She exhibits no edema and no tenderness.  Lymphadenopathy:    She has no cervical adenopathy.  Neurological: She is alert and oriented to person, place, and time. No cranial nerve deficit. She exhibits normal muscle tone. Coordination normal.  Skin: Skin is warm and dry. No rash noted. She is not diaphoretic. No erythema. No pallor.  Psychiatric: She has a normal mood and affect. Her behavior is normal. Judgment and thought content normal.          Assessment & Plan:

## 2011-03-12 NOTE — Assessment & Plan Note (Signed)
Symptoms and exam are most consistent with maxillary sinusitis. Will treat with Bactrim. We'll use codeine and Mucinex as needed for cough. Patient will continue to use ibuprofen as needed for pain and inflammation. Patient will call if symptoms are not improving over the next 48 hours.

## 2011-05-27 ENCOUNTER — Other Ambulatory Visit: Payer: Self-pay | Admitting: *Deleted

## 2011-05-27 MED ORDER — ZOLPIDEM TARTRATE 10 MG PO TABS: 10.0000 mg | ORAL_TABLET | Freq: Every evening | ORAL | Status: DC | PRN

## 2011-05-27 NOTE — Telephone Encounter (Signed)
Fine to refill x 1 month with 3 refills

## 2011-05-27 NOTE — Telephone Encounter (Signed)
Zolpidem request [No history last refill/Last OV 02.12.13] Please advise.

## 2011-05-27 NOTE — Telephone Encounter (Signed)
Rx phoned in to pharmacy; [facility] to inform patient/SLS

## 2011-05-27 NOTE — Telephone Encounter (Signed)
Addended by: Regis Bill on: 05/27/2011 04:38 PM   Modules accepted: Orders

## 2011-06-10 ENCOUNTER — Telehealth: Payer: Self-pay | Admitting: Internal Medicine

## 2011-06-10 DIAGNOSIS — R928 Other abnormal and inconclusive findings on diagnostic imaging of breast: Secondary | ICD-10-CM

## 2011-06-10 NOTE — Telephone Encounter (Signed)
Order is entered

## 2011-06-10 NOTE — Telephone Encounter (Signed)
Patient is needing and order for 6 month follow up at [facility] for a uni left for LT Parenchymal Prominence.

## 2011-06-12 ENCOUNTER — Telehealth: Payer: Self-pay | Admitting: Internal Medicine

## 2011-06-12 NOTE — Telephone Encounter (Signed)
Error  See phone note for 5/13

## 2011-06-12 NOTE — Telephone Encounter (Signed)
Pt called to get phone number for [place].  Pt is going to cancel this appointment. She is going to check with insurance to see if they will pay.  She does not want to go to [facility]

## 2011-07-23 ENCOUNTER — Telehealth: Payer: Self-pay | Admitting: Internal Medicine

## 2011-07-23 NOTE — Telephone Encounter (Signed)
FYI

## 2011-07-23 NOTE — Telephone Encounter (Signed)
Pt canceled her mammogram appointment 6/3 [name]

## 2011-07-25 ENCOUNTER — Ambulatory Visit (INDEPENDENT_AMBULATORY_CARE_PROVIDER_SITE_OTHER): Payer: BC Managed Care – PPO | Admitting: Internal Medicine

## 2011-07-25 ENCOUNTER — Encounter: Payer: Self-pay | Admitting: Internal Medicine

## 2011-07-25 VITALS — BP 130/80 | HR 65 | Temp 98.2°F | Ht 65.0 in | Wt 187.2 lb

## 2011-07-25 DIAGNOSIS — G4700 Insomnia, unspecified: Secondary | ICD-10-CM

## 2011-07-25 DIAGNOSIS — I10 Essential (primary) hypertension: Secondary | ICD-10-CM

## 2011-07-25 DIAGNOSIS — M2550 Pain in unspecified joint: Secondary | ICD-10-CM

## 2011-07-25 DIAGNOSIS — F3289 Other specified depressive episodes: Secondary | ICD-10-CM

## 2011-07-25 DIAGNOSIS — R5383 Other fatigue: Secondary | ICD-10-CM | POA: Insufficient documentation

## 2011-07-25 DIAGNOSIS — F32A Depression, unspecified: Secondary | ICD-10-CM

## 2011-07-25 DIAGNOSIS — F329 Major depressive disorder, single episode, unspecified: Secondary | ICD-10-CM

## 2011-07-25 DIAGNOSIS — Z0000 Encounter for general adult medical examination without abnormal findings: Secondary | ICD-10-CM | POA: Insufficient documentation

## 2011-07-25 DIAGNOSIS — Z1239 Encounter for other screening for malignant neoplasm of breast: Secondary | ICD-10-CM

## 2011-07-25 DIAGNOSIS — Z124 Encounter for screening for malignant neoplasm of cervix: Secondary | ICD-10-CM

## 2011-07-25 DIAGNOSIS — R5381 Other malaise: Secondary | ICD-10-CM

## 2011-07-25 MED ORDER — NAPROXEN 500 MG PO TABS: 500.0000 mg | ORAL_TABLET | Freq: Every day | ORAL | Status: DC | PRN

## 2011-07-25 MED ORDER — BUPROPION HCL ER (XL) 150 MG PO TB24: 150.0000 mg | ORAL_TABLET | ORAL | Status: DC

## 2011-07-25 MED ORDER — ZOLPIDEM TARTRATE 10 MG PO TABS: 10.0000 mg | ORAL_TABLET | Freq: Every evening | ORAL | Status: DC | PRN

## 2011-07-25 MED ORDER — NEBIVOLOL HCL 10 MG PO TABS: 10.0000 mg | ORAL_TABLET | Freq: Every day | ORAL | Status: DC

## 2011-07-25 NOTE — Progress Notes (Signed)
Subjective:    Patient ID: [name], female    DOB: [birth year], 56 y.o.   MRN: [id number]  HPI 56 year old female with history of anxiety/depression, hypertension, insomnia presents for annual exam. She has several concerns today. First, she notes recent increase in depressed mood. She reports that she is frequently tearful. She notes increased stress with her job and at home. She reports minimal improvement in symptoms with Celexa. She notes that she is not sleeping well. She has increased her dose of Ambien to 10 mg nightly with minimal improvement.  She is also concerned about several months of worsening fatigue. She denies any focal symptoms such as shortness of breath, chest pain, change in bowel habits, unexpected weight loss. She reports that even in the morning she is extremely fatigued. This has not improved with use of Ambien and slightly improved sleep at night.  Outpatient Encounter Prescriptions as of 07/25/2011  Medication Sig Dispense Refill  . citalopram (CELEXA) 40 MG tablet Take 1 tablet (40 mg total) by mouth daily.  30 tablet  6  . Cyanocobalamin (VITAMIN B-12) 1000 MCG SUBL Place 1 tablet under the tongue daily.        Marland Kitchen losartan-hydrochlorothiazide (HYZAAR) 100-12.5 MG per tablet Take 1 tablet by mouth daily.  30 tablet  6  . Multiple Vitamin (MULTIVITAMIN) capsule Take 1 capsule by mouth daily.        . naproxen (NAPROSYN) 500 MG tablet Take 1 tablet (500 mg total) by mouth daily as needed.  90 tablet  3  . nebivolol (BYSTOLIC) 10 MG tablet Take 1 tablet (10 mg total) by mouth daily.  90 tablet  4  . SUMAtriptan (IMITREX) 100 MG tablet Take 1 tablet (100 mg total) by mouth daily as needed.  10 tablet  6  . SUMAtriptan (IMITREX) 6 MG/0.5ML SOLN Inject 0.5 mLs (6 mg total) into the skin every 2 (two) hours as needed.  0.5 mL  6  . zolpidem (AMBIEN) 10 MG tablet Take 1 tablet (10 mg total) by mouth at bedtime as needed.  90 tablet  1    Review of Systems    Constitutional: Positive for fatigue. Negative for fever, chills, appetite change and unexpected weight change.  HENT: Negative for ear pain, congestion, sore throat, trouble swallowing, neck pain, voice change and sinus pressure.   Eyes: Negative for visual disturbance.  Respiratory: Negative for cough, shortness of breath, wheezing and stridor.   Cardiovascular: Negative for chest pain, palpitations and leg swelling.  Gastrointestinal: Negative for nausea, vomiting, abdominal pain, diarrhea, constipation, blood in stool, abdominal distention and anal bleeding.  Genitourinary: Negative for dysuria and flank pain.  Musculoskeletal: Negative for myalgias, arthralgias and gait problem.  Skin: Negative for color change and rash.  Neurological: Negative for dizziness and headaches.  Hematological: Negative for adenopathy. Does not bruise/bleed easily.  Psychiatric/Behavioral: Positive for disturbed wake/sleep cycle and dysphoric mood. Negative for suicidal ideas. The patient is nervous/anxious.    BP 130/80  Pulse 65  Temp 98.2 F (36.8 C) (Oral)  Ht 5\' 5"  (1.651 m)  Wt 187 lb 4 oz (84.936 kg)  BMI 31.16 kg/m2  SpO2 97%     Objective:   Physical Exam  Constitutional: She is oriented to person, place, and time. She appears well-developed and well-nourished. No distress.  HENT:  Head: Normocephalic and atraumatic.  Right Ear: External ear normal.  Left Ear: External ear normal.  Nose: Nose normal.  Mouth/Throat: Oropharynx is clear and moist. No oropharyngeal exudate.  Eyes: Conjunctivae are normal. Pupils are equal, round, and reactive to light. Right eye exhibits no discharge. Left eye exhibits no discharge. No scleral icterus.  Neck: Normal range of motion. Neck supple. No tracheal deviation present. No thyromegaly present.  Cardiovascular: Normal rate, regular rhythm, normal heart sounds and intact distal pulses.  Exam reveals no gallop and no friction rub.   No murmur  heard. Pulmonary/Chest: Effort normal and breath sounds normal. No respiratory distress. She has no wheezes. She has no rales. She exhibits no tenderness. Right breast exhibits no inverted nipple, no mass, no nipple discharge, no skin change and no tenderness. Left breast exhibits no inverted nipple, no mass, no nipple discharge, no skin change and no tenderness. Breasts are symmetrical.    Abdominal: Soft. Bowel sounds are normal. She exhibits no distension and no mass. There is no tenderness. There is no rebound and no guarding.  Genitourinary: Rectum normal and vagina normal. No breast swelling, tenderness, discharge or bleeding. Pelvic exam was performed with patient supine. There is no rash, tenderness or lesion on the right labia. There is no rash, tenderness or lesion on the left labia. Right adnexum displays no mass, no tenderness and no fullness. Left adnexum displays no mass, no tenderness and no fullness. No erythema or tenderness around the vagina. No vaginal discharge found.       Uterus surgically absent. Vaginal cuff present  Musculoskeletal: Normal range of motion. She exhibits no edema and no tenderness.  Lymphadenopathy:    She has no cervical adenopathy.  Neurological: She is alert and oriented to person, place, and time. No cranial nerve deficit. She exhibits normal muscle tone. Coordination normal.  Skin: Skin is warm and dry. No rash noted. She is not diaphoretic. No erythema. No pallor.  Psychiatric: She has a normal mood and affect. Her behavior is normal. Judgment and thought content normal.          Assessment & Plan:

## 2011-07-25 NOTE — Assessment & Plan Note (Signed)
General medical exam including breast and pelvic exam is normal today. Pap is pending. Health maintenance is up to date. Mammogram will be scheduled.

## 2011-07-25 NOTE — Assessment & Plan Note (Signed)
Symptoms recently worsening. Will try adding back Wellbutrin with Celexa. Patient will followup in one month or sooner if no improvement.

## 2011-07-25 NOTE — Assessment & Plan Note (Signed)
Blood pressure well-controlled on current medications. Will check renal function with labs today. 

## 2011-07-25 NOTE — Assessment & Plan Note (Signed)
Suspect fatigue related to ongoing depression and anxiety. Adding Wellbutrin to help with symptoms. Encourage patient to be physically active during the day. Discussed alternative consideration of sleep apnea. If symptoms are not improving over the next few weeks, will plan to set up a sleep study. We'll also check lab work today including CBC, CMP, TSH, B12. Follow up 1 month.

## 2011-07-26 ENCOUNTER — Other Ambulatory Visit: Payer: Self-pay | Admitting: Internal Medicine

## 2011-07-26 LAB — HM PAP SMEAR: HM Pap smear: NEGATIVE

## 2011-07-26 NOTE — Addendum Note (Signed)
Addended by: Jobie Quaker on: 07/26/2011 09:31 AM   Modules accepted: Orders

## 2011-07-26 NOTE — Addendum Note (Signed)
Addended by: Jobie Quaker on: 07/26/2011 09:42 AM   Modules accepted: Orders

## 2011-08-01 LAB — PAP LB AND HPV HIGH-RISK
HPV, high-risk: NEGATIVE
PAP Smear Comment: 0

## 2011-08-01 LAB — SPECIMEN STATUS REPORT

## 2011-08-02 ENCOUNTER — Other Ambulatory Visit: Payer: Self-pay | Admitting: Internal Medicine

## 2011-08-05 LAB — HM MAMMOGRAPHY: HM Mammogram: NORMAL

## 2011-08-06 LAB — CBC WITH DIFFERENTIAL
Basophils Absolute: 0 10*3/uL (ref 0.0–0.2)
Eosinophils Absolute: 0.1 10*3/uL (ref 0.0–0.4)
Hemoglobin: 12 g/dL (ref 11.1–15.9)
Immature Grans (Abs): 0 10*3/uL (ref 0.0–0.1)
Lymphs: 28 % (ref 14–46)
MCHC: 33.3 g/dL (ref 31.5–35.7)
Monocytes Absolute: 0.4 10*3/uL (ref 0.1–1.0)
Neutrophils Relative %: 64 % (ref 40–74)
Platelets: 326 10*3/uL (ref 140–415)
RDW: 12.6 % (ref 12.3–15.4)

## 2011-08-06 LAB — COMPREHENSIVE METABOLIC PANEL
ALT: 18 IU/L (ref 0–32)
AST: 18 IU/L (ref 0–40)
Albumin/Globulin Ratio: 1.5 (ref 1.1–2.5)
Alkaline Phosphatase: 120 IU/L (ref 25–150)
Chloride: 105 mmol/L (ref 97–108)
GFR calc Af Amer: 117 mL/min/{1.73_m2} (ref >59)
GFR calc non Af Amer: 102 mL/min/{1.73_m2} (ref >59)
Glucose: 96 mg/dL (ref 65–99)
Potassium: 4.6 mmol/L (ref 3.5–5.2)
Sodium: 140 mmol/L (ref 134–144)
Total Bilirubin: 0.3 mg/dL (ref 0.0–1.2)
Total Protein: 6.6 g/dL (ref 6.0–8.5)

## 2011-08-06 LAB — TSH: TSH: 2.95 u[IU]/mL (ref 0.450–4.500)

## 2011-08-06 LAB — B12 AND FOLATE PANEL: Folate: 14.1 ng/mL (ref >3.0)

## 2011-08-06 LAB — LIPID PANEL W/O CHOL/HDL RATIO
Cholesterol, Total: 192 mg/dL (ref 100–199)
VLDL Cholesterol Cal: 24 mg/dL (ref 5–40)

## 2011-08-12 ENCOUNTER — Telehealth: Payer: Self-pay | Admitting: Internal Medicine

## 2011-08-12 NOTE — Telephone Encounter (Signed)
Patient wanting blood work results from 7.5.13 mailed to her home.

## 2011-08-12 NOTE — Telephone Encounter (Signed)
Copy of labs mailed to patient's home address.

## 2011-08-15 ENCOUNTER — Encounter: Payer: Self-pay | Admitting: Internal Medicine

## 2011-08-15 ENCOUNTER — Telehealth: Payer: Self-pay | Admitting: Internal Medicine

## 2011-08-15 MED ORDER — FLUCONAZOLE 150 MG PO TABS: 150.0000 mg | ORAL_TABLET | Freq: Once | ORAL | Status: DC

## 2011-08-15 NOTE — Telephone Encounter (Signed)
Patient states that she has had a yeast infection for the past week and infection is not getting any better. Patient would like to know if Dr. Dan Humphreys would call her in something w/o her having to be seen. [facility]

## 2011-08-15 NOTE — Telephone Encounter (Signed)
Patient advised as instructed via telephone, Rx sent to pharmacy. 

## 2011-08-15 NOTE — Telephone Encounter (Signed)
Fine to call in Diflucan 150mg  po x1. If no improvement, needs to be seen.

## 2011-08-23 ENCOUNTER — Other Ambulatory Visit: Payer: Self-pay | Admitting: Internal Medicine

## 2011-08-23 DIAGNOSIS — G43909 Migraine, unspecified, not intractable, without status migrainosus: Secondary | ICD-10-CM

## 2011-08-23 MED ORDER — SUMATRIPTAN SUCCINATE 100 MG PO TABS: 100.0000 mg | ORAL_TABLET | Freq: Every day | ORAL | Status: DC | PRN

## 2011-08-23 NOTE — Telephone Encounter (Signed)
Patient called in states she needs her mitrex 100 mg tablets called into [facility] at [phone]. I did advise patient of our policy she stated that is how she did send in request, but the pharmacy told her she needed to contact the office.

## 2011-08-23 NOTE — Telephone Encounter (Signed)
Rx sent to pharmacy patient advised via telephone. 

## 2011-09-18 ENCOUNTER — Ambulatory Visit: Payer: BC Managed Care – PPO | Admitting: Internal Medicine

## 2011-10-31 ENCOUNTER — Telehealth: Payer: Self-pay | Admitting: Internal Medicine

## 2011-10-31 NOTE — Telephone Encounter (Signed)
Pt is calling back concerning a charge on 6.27.13 for her CPE that she was supposed to get a refund for. I told her that iot may take some time but she says she still hasn't reciened refund and has called Billing 3 times and they won't seem to help her ??

## 2011-11-01 NOTE — Telephone Encounter (Signed)
I have sent an email on 10/10/11 to Annabell Trisha Mangle who forward this information on to Abby on 10/11/11 asking for her help. I am not sure how long this process takes. I have left a message for patient to return my call so I can advise her of the information.

## 2011-11-01 NOTE — Telephone Encounter (Signed)
Patient called back and I gave her the information, she was appreciative that I had sent an email.

## 2011-12-02 ENCOUNTER — Encounter: Payer: Self-pay | Admitting: Internal Medicine

## 2011-12-02 ENCOUNTER — Ambulatory Visit (INDEPENDENT_AMBULATORY_CARE_PROVIDER_SITE_OTHER): Payer: BC Managed Care – PPO | Admitting: Internal Medicine

## 2011-12-02 VITALS — BP 122/80 | HR 60 | Temp 98.0°F | Ht 65.0 in | Wt 189.2 lb

## 2011-12-02 DIAGNOSIS — F32A Depression, unspecified: Secondary | ICD-10-CM

## 2011-12-02 DIAGNOSIS — M7711 Lateral epicondylitis, right elbow: Secondary | ICD-10-CM

## 2011-12-02 DIAGNOSIS — M7710 Lateral epicondylitis, unspecified elbow: Secondary | ICD-10-CM

## 2011-12-02 DIAGNOSIS — F329 Major depressive disorder, single episode, unspecified: Secondary | ICD-10-CM

## 2011-12-02 MED ORDER — CITALOPRAM HYDROBROMIDE 20 MG PO TABS: 20.0000 mg | ORAL_TABLET | Freq: Every day | ORAL | Status: DC

## 2011-12-02 MED ORDER — PREDNISONE (PAK) 10 MG PO TABS: ORAL_TABLET | ORAL | Status: DC

## 2011-12-02 MED ORDER — BUPROPION HCL ER (XL) 300 MG PO TB24: 300.0000 mg | ORAL_TABLET | ORAL | Status: DC

## 2011-12-02 NOTE — Patient Instructions (Signed)
Lateral Epicondylitis (Tennis Elbow) with Rehab Lateral epicondylitis involves inflammation and pain around the outer portion of the elbow. The pain is caused by inflammation of the tendons in the forearm that bring back (extend) the wrist. Lateral epicondylittis is also called tennis elbow, because it is very common in tennis players. However, it may occur in any individual who extends the wrist repetitively. If lateral epicondylitis is left untreated, it may become a chronic problem. SYMPTOMS   Pain, tenderness, and inflammation on the outer (lateral) side of the elbow.  Pain or weakness with gripping activities.  Pain that increases with wrist twisting motions (playing tennis, using a screwdriver, opening a door or a jar).  Pain with lifting objects, including a coffee cup. CAUSES  Lateral epicondylitis is caused by inflammation of the tendons that extend the wrist. Causes of injury may include:  Repetitive stress and strain on the muscles and tendons that extend the wrist.  Sudden change in activity level or intensity.  Incorrect grip in racquet sports.  Incorrect grip size of racquet (often too large).  Incorrect hitting position or technique (usually backhand, leading with the elbow).  Using a racket that is too heavy. RISK INCREASES WITH:  Sports or occupations that require repetitive and/or strenuous forearm and wrist movements (tennis, squash, racquetball, carpentry).  Poor wrist and forearm strength and flexibility.  Failure to warm up properly before activity.  Resuming activity before healing, rehabilitation, and conditioning are complete. PREVENTION   Warm up and stretch properly before activity.  Maintain physical fitness:  Strength, flexibility, and endurance.  Cardiovascular fitness.  Wear and use properly fitted equipment.  Learn and use proper technique and have a coach correct improper technique.  Wear a tennis elbow (counterforce) brace. PROGNOSIS   The course of this condition depends on the degree of the injury. If treated properly, acute cases (symptoms lasting less than 4 weeks) are often resolved in 2 to 6 weeks. Chronic (longer lasting cases) often resolve in 3 to 6 months, but may require physical therapy. RELATED COMPLICATIONS   Frequently recurring symptoms, resulting in a chronic problem. Properly treating the problem the first time decreases frequency of recurrence.  Chronic inflammation, scarring tendon degeneration, and partial tendon tear, requiring surgery.  Delayed healing or resolution of symptoms. TREATMENT  Treatment first involves the use of ice and medicine, to reduce pain and inflammation. Strengthening and stretching exercises may help reduce discomfort, if performed regularly. These exercises may be performed at home, if the condition is an acute injury. Chronic cases may require a referral to a physical therapist for evaluation and treatment. Your caregiver may advise a corticosteroid injection, to help reduce inflammation. Rarely, surgery is needed. MEDICATION  If pain medicine is needed, nonsteroidal anti-inflammatory medicines (aspirin and ibuprofen), or other minor pain relievers (acetaminophen), are often advised.  Do not take pain medicine for 7 days before surgery.  Prescription pain relievers may be given, if your caregiver thinks they are needed. Use only as directed and only as much as you need.  Corticosteroid injections may be recommended. These injections should be reserved only for the most severe cases, because they can only be given a certain number of times. HEAT AND COLD  Cold treatment (icing) should be applied for 10 to 15 minutes every 2 to 3 hours for inflammation and pain, and immediately after activity that aggravates your symptoms. Use ice packs or an ice massage.  Heat treatment may be used before performing stretching and strengthening activities prescribed by your   caregiver, physical  therapist, or athletic trainer. Use a heat pack or a warm water soak. SEEK MEDICAL CARE IF: Symptoms get worse or do not improve in 2 weeks, despite treatment. EXERCISES  RANGE OF MOTION (ROM) AND STRETCHING EXERCISES - Epicondylitis, Lateral (Tennis Elbow) These exercises may help you when beginning to rehabilitate your injury. Your symptoms may go away with or without further involvement from your physician, physical therapist or athletic trainer. While completing these exercises, remember:   Restoring tissue flexibility helps normal motion to return to the joints. This allows healthier, less painful movement and activity.  An effective stretch should be held for at least 30 seconds.  A stretch should never be painful. You should only feel a gentle lengthening or release in the stretched tissue. RANGE OF MOTION  Wrist Flexion, Active-Assisted  Extend your right / left elbow with your fingers pointing down.*  Gently pull the back of your hand towards you, until you feel a gentle stretch on the top of your forearm.  Hold this position for __________ seconds. Repeat __________ times. Complete this exercise __________ times per day.  *If directed by your physician, physical therapist or athletic trainer, complete this stretch with your elbow bent, rather than extended. RANGE OF MOTION  Wrist Extension, Active-Assisted  Extend your right / left elbow and turn your palm upwards.*  Gently pull your palm and fingertips back, so your wrist extends and your fingers point more toward the ground.  You should feel a gentle stretch on the inside of your forearm.  Hold this position for __________ seconds. Repeat __________ times. Complete this exercise __________ times per day. *If directed by your physician, physical therapist or athletic trainer, complete this stretch with your elbow bent, rather than extended. STRETCH - Wrist Flexion  Place the back of your right / left hand on a tabletop,  leaving your elbow slightly bent. Your fingers should point away from your body.  Gently press the back of your hand down onto the table by straightening your elbow. You should feel a stretch on the top of your forearm.  Hold this position for __________ seconds. Repeat __________ times. Complete this stretch __________ times per day.  STRETCH  Wrist Extension   Place your right / left fingertips on a tabletop, leaving your elbow slightly bent. Your fingers should point backwards.  Gently press your fingers and palm down onto the table by straightening your elbow. You should feel a stretch on the inside of your forearm.  Hold this position for __________ seconds. Repeat __________ times. Complete this stretch __________ times per day.  STRENGTHENING EXERCISES - Epicondylitis, Lateral (Tennis Elbow) These exercises may help you when beginning to rehabilitate your injury. They may resolve your symptoms with or without further involvement from your physician, physical therapist or athletic trainer. While completing these exercises, remember:   Muscles can gain both the endurance and the strength needed for everyday activities through controlled exercises.  Complete these exercises as instructed by your physician, physical therapist or athletic trainer. Increase the resistance and repetitions only as guided.  You may experience muscle soreness or fatigue, but the pain or discomfort you are trying to eliminate should never worsen during these exercises. If this pain does get worse, stop and make sure you are following the directions exactly. If the pain is still present after adjustments, discontinue the exercise until you can discuss the trouble with your caregiver. STRENGTH Wrist Flexors  Sit with your right / left forearm palm-up and   fully supported on a table or countertop. Your elbow should be resting below the height of your shoulder. Allow your wrist to extend over the edge of the  surface.  Loosely holding a __________ weight, or a piece of rubber exercise band or tubing, slowly curl your hand up toward your forearm.  Hold this position for __________ seconds. Slowly lower the wrist back to the starting position in a controlled manner. Repeat __________ times. Complete this exercise __________ times per day.  STRENGTH  Wrist Extensors  Sit with your right / left forearm palm-down and fully supported on a table or countertop. Your elbow should be resting below the height of your shoulder. Allow your wrist to extend over the edge of the surface.  Loosely holding a __________ weight, or a piece of rubber exercise band or tubing, slowly curl your hand up toward your forearm.  Hold this position for __________ seconds. Slowly lower the wrist back to the starting position in a controlled manner. Repeat __________ times. Complete this exercise __________ times per day.  STRENGTH - Ulnar Deviators  Stand with a ____________________ weight in your right / left hand, or sit while holding a rubber exercise band or tubing, with your healthy arm supported on a table or countertop.  Move your wrist, so that your pinkie travels toward your forearm and your thumb moves away from your forearm.  Hold this position for __________ seconds and then slowly lower the wrist back to the starting position. Repeat __________ times. Complete this exercise __________ times per day STRENGTH - Radial Deviators  Stand with a ____________________ weight in your right / left hand, or sit while holding a rubber exercise band or tubing, with your injured arm supported on a table or countertop.  Raise your hand upward in front of you or pull up on the rubber tubing.  Hold this position for __________ seconds and then slowly lower the wrist back to the starting position. Repeat __________ times. Complete this exercise __________ times per day. STRENGTH  Forearm Supinators   Sit with your right /  left forearm supported on a table, keeping your elbow below shoulder height. Rest your hand over the edge, palm down.  Gently grip a hammer or a soup ladle.  Without moving your elbow, slowly turn your palm and hand upward to a "thumbs-up" position.  Hold this position for __________ seconds. Slowly return to the starting position. Repeat __________ times. Complete this exercise __________ times per day.  STRENGTH  Forearm Pronators   Sit with your right / left forearm supported on a table, keeping your elbow below shoulder height. Rest your hand over the edge, palm up.  Gently grip a hammer or a soup ladle.  Without moving your elbow, slowly turn your palm and hand upward to a "thumbs-up" position.  Hold this position for __________ seconds. Slowly return to the starting position. Repeat __________ times. Complete this exercise __________ times per day.  STRENGTH - Grip  Grasp a tennis ball, a dense sponge, or a large, rolled sock in your hand.  Squeeze as hard as you can, without increasing any pain.  Hold this position for __________ seconds. Release your grip slowly. Repeat __________ times. Complete this exercise __________ times per day.  STRENGTH - Elbow Extensors, Isometric  Stand or sit upright, on a firm surface. Place your right / left arm so that your palm faces your stomach, and it is at the height of your waist.  Place your opposite hand on   the underside of your forearm. Gently push up as your right / left arm resists. Push as hard as you can with both arms, without causing any pain or movement at your right / left elbow. Hold this stationary position for __________ seconds. Gradually release the tension in both arms. Allow your muscles to relax completely before repeating. Document Released: 01/14/2005 Document Revised: 04/08/2011 Document Reviewed: 04/28/2008 [employer] Patient Information 2013 [employer].  

## 2011-12-02 NOTE — Assessment & Plan Note (Signed)
Exam is consistent with lateral epicondylitis of the right elbow with overlying bursitis. Will try adding prednisone taper and will have patient immobilize arm in a sling. We discussed referral to sports medicine for evaluation and possible localized steroid injection. She would like to hold off for now. Followup in 4 weeks.

## 2011-12-02 NOTE — Assessment & Plan Note (Signed)
Symptoms of depression have been persistent with the Celexa and Wellbutrin. Initially had some improvement with Wellbutrin, but this has waned.  Pt would prefer to transition to one medication. Will try increasing dose of wellbutrin and tapering off celexa. We discussed risk of using two medications which elevate serotonin and need to taper to one med preferrably. Follow up 1 month.

## 2011-12-02 NOTE — Progress Notes (Signed)
Subjective:    Patient ID: [name], female    DOB: 1955-08-03, 56 y.o.   MRN: [id number]  HPI 56 year old female with history of depression presents for acute visit complaining of right elbow pain and swelling. She reports that she typically uses her right arm throughout the day at her computer to operate her mouth. Over the last few weeks she has developed swelling and pain in her lateral right elbow. She reports that pain shoots down her right arm into her right hand. She denies weakness or numbness in her right hand. She has not been taking any medication for this.  In regards to her history of depression, she was recently started on Wellbutrin. She reports initially she had some improvement with this medication but over the last few weeks she has noted increased tearfulness and depressed mood.  Outpatient Encounter Prescriptions as of 12/02/2011  Medication Sig Dispense Refill  . buPROPion (WELLBUTRIN XL) 300 MG 24 hr tablet Take 1 tablet (300 mg total) by mouth every morning.  30 tablet  6  . citalopram (CELEXA) 20 MG tablet Take 1 tablet (20 mg total) by mouth daily.  30 tablet  6  . Cyanocobalamin (VITAMIN B-12) 1000 MCG SUBL Place 1 tablet under the tongue daily.        Marland Kitchen losartan-hydrochlorothiazide (HYZAAR) 100-12.5 MG per tablet Take 1 tablet by mouth daily.  30 tablet  6  . Multiple Vitamin (MULTIVITAMIN) capsule Take 1 capsule by mouth daily.        . naproxen (NAPROSYN) 500 MG tablet Take 1 tablet (500 mg total) by mouth daily as needed.  90 tablet  3  . nebivolol (BYSTOLIC) 10 MG tablet Take 1 tablet (10 mg total) by mouth daily.  90 tablet  4  . SUMAtriptan (IMITREX) 100 MG tablet Take 1 tablet (100 mg total) by mouth daily as needed.  10 tablet  6  . SUMAtriptan (IMITREX) 6 MG/0.5ML SOLN Inject 0.5 mLs (6 mg total) into the skin every 2 (two) hours as needed.  0.5 mL  6  . zolpidem (AMBIEN) 10 MG tablet Take 1 tablet (10 mg total) by mouth at bedtime as needed.  90 tablet   1   BP 122/80  Pulse 60  Temp 98 F (36.7 C) (Oral)  Ht 5\' 5"  (1.651 m)  Wt 189 lb 4 oz (85.843 kg)  BMI 31.49 kg/m2  SpO2 97%  Review of Systems  Constitutional: Negative for fever, chills, appetite change, fatigue and unexpected weight change.  HENT: Negative for ear pain, congestion, sore throat, trouble swallowing, neck pain, voice change and sinus pressure.   Eyes: Negative for visual disturbance.  Respiratory: Negative for cough, shortness of breath, wheezing and stridor.   Cardiovascular: Negative for chest pain, palpitations and leg swelling.  Gastrointestinal: Negative for nausea, vomiting, abdominal pain, diarrhea, constipation, blood in stool, abdominal distention and anal bleeding.  Genitourinary: Negative for dysuria and flank pain.  Musculoskeletal: Positive for myalgias, joint swelling and arthralgias. Negative for gait problem.  Skin: Negative for color change and rash.  Neurological: Negative for dizziness and headaches.  Hematological: Negative for adenopathy. Does not bruise/bleed easily.  Psychiatric/Behavioral: Positive for dysphoric mood. Negative for suicidal ideas and sleep disturbance. The patient is nervous/anxious.        Objective:   Physical Exam  Constitutional: She is oriented to person, place, and time. She appears well-developed and well-nourished. No distress.  HENT:  Head: Normocephalic and atraumatic.  Right Ear: External ear normal.  Left Ear: External ear normal.  Nose: Nose normal.  Mouth/Throat: Oropharynx is clear and moist. No oropharyngeal exudate.  Eyes: Conjunctivae normal are normal. Pupils are equal, round, and reactive to light. Right eye exhibits no discharge. Left eye exhibits no discharge. No scleral icterus.  Neck: Normal range of motion. Neck supple. No tracheal deviation present. No thyromegaly present.  Cardiovascular: Normal rate, regular rhythm, normal heart sounds and intact distal pulses.  Exam reveals no gallop and no  friction rub.   No murmur heard. Pulmonary/Chest: Effort normal and breath sounds normal. No respiratory distress. She has no wheezes. She has no rales. She exhibits no tenderness.  Musculoskeletal: Normal range of motion. She exhibits no edema and no tenderness.       Right elbow: She exhibits swelling. She exhibits normal range of motion. tenderness found. Lateral epicondyle tenderness noted.  Lymphadenopathy:    She has no cervical adenopathy.  Neurological: She is alert and oriented to person, place, and time. No cranial nerve deficit. She exhibits normal muscle tone. Coordination normal.  Skin: Skin is warm and dry. No rash noted. She is not diaphoretic. No erythema. No pallor.  Psychiatric: She has a normal mood and affect. Her behavior is normal. Judgment and thought content normal.          Assessment & Plan:

## 2012-01-20 ENCOUNTER — Telehealth: Payer: Self-pay | Admitting: Internal Medicine

## 2012-01-20 NOTE — Telephone Encounter (Signed)
error 

## 2012-02-26 ENCOUNTER — Ambulatory Visit: Payer: BC Managed Care – PPO | Admitting: Internal Medicine

## 2012-03-04 ENCOUNTER — Ambulatory Visit (INDEPENDENT_AMBULATORY_CARE_PROVIDER_SITE_OTHER): Payer: BC Managed Care – PPO | Admitting: Internal Medicine

## 2012-03-04 ENCOUNTER — Encounter: Payer: Self-pay | Admitting: Internal Medicine

## 2012-03-04 VITALS — BP 120/88 | HR 56 | Temp 97.9°F | Ht 64.75 in | Wt 190.0 lb

## 2012-03-04 DIAGNOSIS — F32A Depression, unspecified: Secondary | ICD-10-CM

## 2012-03-04 DIAGNOSIS — R5383 Other fatigue: Secondary | ICD-10-CM

## 2012-03-04 DIAGNOSIS — G4700 Insomnia, unspecified: Secondary | ICD-10-CM

## 2012-03-04 DIAGNOSIS — F3289 Other specified depressive episodes: Secondary | ICD-10-CM

## 2012-03-04 DIAGNOSIS — R609 Edema, unspecified: Secondary | ICD-10-CM

## 2012-03-04 DIAGNOSIS — M26621 Arthralgia of right temporomandibular joint: Secondary | ICD-10-CM | POA: Insufficient documentation

## 2012-03-04 DIAGNOSIS — R5381 Other malaise: Secondary | ICD-10-CM

## 2012-03-04 DIAGNOSIS — G43909 Migraine, unspecified, not intractable, without status migrainosus: Secondary | ICD-10-CM

## 2012-03-04 DIAGNOSIS — K649 Unspecified hemorrhoids: Secondary | ICD-10-CM

## 2012-03-04 DIAGNOSIS — H6692 Otitis media, unspecified, left ear: Secondary | ICD-10-CM

## 2012-03-04 DIAGNOSIS — I10 Essential (primary) hypertension: Secondary | ICD-10-CM

## 2012-03-04 DIAGNOSIS — H6690 Otitis media, unspecified, unspecified ear: Secondary | ICD-10-CM

## 2012-03-04 DIAGNOSIS — M2550 Pain in unspecified joint: Secondary | ICD-10-CM

## 2012-03-04 DIAGNOSIS — Z1211 Encounter for screening for malignant neoplasm of colon: Secondary | ICD-10-CM

## 2012-03-04 DIAGNOSIS — G43009 Migraine without aura, not intractable, without status migrainosus: Secondary | ICD-10-CM | POA: Insufficient documentation

## 2012-03-04 DIAGNOSIS — F329 Major depressive disorder, single episode, unspecified: Secondary | ICD-10-CM

## 2012-03-04 MED ORDER — LOSARTAN POTASSIUM-HCTZ 100-12.5 MG PO TABS: 1.0000 | ORAL_TABLET | Freq: Every day | ORAL | Status: DC

## 2012-03-04 MED ORDER — SUMATRIPTAN SUCCINATE 6 MG/0.5ML ~~LOC~~ SOLN: 6.0000 mg | SUBCUTANEOUS | Status: DC | PRN

## 2012-03-04 MED ORDER — AZITHROMYCIN 250 MG PO TABS: ORAL_TABLET | ORAL | Status: DC

## 2012-03-04 MED ORDER — BUPROPION HCL ER (XL) 150 MG PO TB24: 150.0000 mg | ORAL_TABLET | ORAL | Status: DC

## 2012-03-04 MED ORDER — ZOLPIDEM TARTRATE 10 MG PO TABS: 10.0000 mg | ORAL_TABLET | Freq: Every evening | ORAL | Status: DC | PRN

## 2012-03-04 MED ORDER — NAPROXEN 500 MG PO TABS: 500.0000 mg | ORAL_TABLET | Freq: Every day | ORAL | Status: DC | PRN

## 2012-03-04 NOTE — Assessment & Plan Note (Signed)
Patient with long history of hemorrhoids. Not currently painful or bleeding. She would like a surgical consult for evaluation for removal. Will set up surgical eval.

## 2012-03-04 NOTE — Assessment & Plan Note (Addendum)
Symptoms of depression poorly controlled on Wellbutrin. Will reduce dose to 150mg  daily with plan to get off this medication. Discussed alternative medications, however pt has tried and failed Citalopram, Fluoxetine, and Sertraline.  Discussed abilify or pristique but pt unable to afford. Recommended referral to psychiatry but pt unable to afford. Recommended the book "Feeling Good" by Burns. Pt will contract for safety. Pt will follow up in 1 month or sooner as needed.  Over time spent in medical management including >50% face-to-face contact with patient in discussing depression and plan of care.

## 2012-03-04 NOTE — Assessment & Plan Note (Signed)
Likely related to depression. Will check CMP, CBC, TSH with labs. Depression treated as described.

## 2012-03-04 NOTE — Assessment & Plan Note (Signed)
Lower extremity edema is most consistent with dependent edema from mild venous insufficiency. Encouraged her to wear compression hose during the day. Will also check electrolytes, kidney function, and thyroid function with labs today.

## 2012-03-04 NOTE — Assessment & Plan Note (Signed)
Symptoms and exam consistent with left OM. Will treat with azithromycin. Pt will use naprosyn prn pain. Pt will RTC in 1 month for recheck.

## 2012-03-04 NOTE — Progress Notes (Signed)
Subjective:    Patient ID: [name], female    DOB: 10/09/1955, 57 y.o.   MRN: [id number]  HPI 57 year old female with history of depression, migraines, hypertension presents for followup. Her primary concern today is worsening depression. She reports that ever since increasing dose of Wellbutrin to 300 mg she has had worsening symptoms of depression and irritability. She reports she is frequently angry and lashes out at others. In the past, she has tried fluoxetine, sertraline, citalopram, and the for depression with no improvement. Medications were briefly and then affect seemed to wear off. She reports she is unable to afford newer medication such as Abilify. She is unable to afford counseling. She has not had any suicide attempts or plans in the past. She has never been hospitalized for depression.  She is also concerned today about intermittent swelling in her lower extremities. This typically occurs after standing at work during the day. It is sometimes painful. It resolves overnight.  She is also concerned today about several week history of nasal congestion, and left ear pain. She denies any fever, chills, cough. She has taken over-the-counter medications for this with no improvement.  She is also concerned today about her weight. She reports that she has increased appetite and significant fatigue with resulting weight gain. She is not currently following a specific diet or participating in exercise program.  Outpatient Encounter Prescriptions as of 03/04/2012  Medication Sig Dispense Refill  . buPROPion (WELLBUTRIN XL) 150 MG 24 hr tablet Take 1 tablet (150 mg total) by mouth every morning.  30 tablet  6  . losartan-hydrochlorothiazide (HYZAAR) 100-12.5 MG per tablet Take 1 tablet by mouth daily.  30 tablet  6  . nebivolol (BYSTOLIC) 10 MG tablet Take 1 tablet (10 mg total) by mouth daily.  90 tablet  4  . SUMAtriptan (IMITREX) 100 MG tablet Take 1 tablet (100 mg total) by mouth daily  as needed.  10 tablet  6  . SUMAtriptan (IMITREX) 6 MG/0.5ML SOLN injection Inject 0.5 mLs (6 mg total) into the skin every 2 (two) hours as needed.  0.5 mL  6  . zolpidem (AMBIEN) 10 MG tablet Take 1 tablet (10 mg total) by mouth at bedtime as needed.  90 tablet  1  . azithromycin (ZITHROMAX Z-PAK) 250 MG tablet Take 2 pills day 1, then 1 pill daily days 2-5  6 each  0  . naproxen (NAPROSYN) 500 MG tablet Take 1 tablet (500 mg total) by mouth daily as needed.  90 tablet  3   BP 120/88  Pulse 56  Temp 97.9 F (36.6 C) (Oral)  Ht 5' 4.75" (1.645 m)  Wt 190 lb (86.183 kg)  BMI 31.86 kg/m2  SpO2 97%  Review of Systems  Constitutional: Positive for fatigue. Negative for fever, chills, appetite change and unexpected weight change.  HENT: Positive for ear pain and congestion. Negative for sore throat, trouble swallowing, neck pain, voice change and sinus pressure.   Eyes: Negative for visual disturbance.  Respiratory: Negative for cough, shortness of breath, wheezing and stridor.   Cardiovascular: Negative for chest pain, palpitations and leg swelling.  Gastrointestinal: Negative for nausea, vomiting, abdominal pain, diarrhea, constipation, blood in stool, abdominal distention and anal bleeding.  Genitourinary: Negative for dysuria and flank pain.  Musculoskeletal: Negative for myalgias, arthralgias and gait problem.  Skin: Negative for color change and rash.  Neurological: Negative for dizziness and headaches.  Hematological: Negative for adenopathy. Does not bruise/bleed easily.  Psychiatric/Behavioral: Positive for sleep  disturbance and dysphoric mood. Negative for suicidal ideas. The patient is not nervous/anxious.        Objective:   Physical Exam  Constitutional: She is oriented to person, place, and time. She appears well-developed and well-nourished. No distress.  HENT:  Head: Normocephalic and atraumatic.  Right Ear: External ear normal. Tympanic membrane is not erythematous  and not bulging. A middle ear effusion is present.  Left Ear: External ear normal. Tympanic membrane is erythematous. A middle ear effusion is present.  Nose: Mucosal edema present. Left sinus exhibits maxillary sinus tenderness.  Mouth/Throat: Oropharynx is clear and moist. No oropharyngeal exudate.  Eyes: Conjunctivae normal are normal. Pupils are equal, round, and reactive to light. Right eye exhibits no discharge. Left eye exhibits no discharge. No scleral icterus.  Neck: Normal range of motion. Neck supple. No tracheal deviation present. No thyromegaly present.  Cardiovascular: Normal rate, regular rhythm, normal heart sounds and intact distal pulses.  Exam reveals no gallop and no friction rub.   No murmur heard. Pulmonary/Chest: Effort normal and breath sounds normal. No respiratory distress. She has no wheezes. She has no rales. She exhibits no tenderness.  Musculoskeletal: Normal range of motion. She exhibits no edema and no tenderness.  Lymphadenopathy:    She has no cervical adenopathy.  Neurological: She is alert and oriented to person, place, and time. No cranial nerve deficit. She exhibits normal muscle tone. Coordination normal.  Skin: Skin is warm and dry. No rash noted. She is not diaphoretic. No erythema. No pallor.  Psychiatric: Her speech is normal and behavior is normal. Judgment and thought content normal. Her mood appears not anxious. Cognition and memory are normal. She exhibits a depressed mood. She expresses no suicidal ideation. She expresses no suicidal plans.          Assessment & Plan:

## 2012-03-04 NOTE — Assessment & Plan Note (Signed)
Symptoms well controlled with Imitrex. Will continue.

## 2012-03-04 NOTE — Assessment & Plan Note (Signed)
BP well controlled today. Will check renal function with labs. Continue current meds.

## 2012-03-04 NOTE — Assessment & Plan Note (Signed)
Will set up screening colonoscopy. 

## 2012-03-04 NOTE — Assessment & Plan Note (Signed)
Symptoms of insomnia improved with Ambien. We discussed the FDA recommendations to reduce dose to 5 mg daily. She will make this change.

## 2012-03-05 ENCOUNTER — Telehealth: Payer: Self-pay | Admitting: Internal Medicine

## 2012-03-05 NOTE — Telephone Encounter (Signed)
Labs from 2/5 were normal including thyroid function, kidney and liver function.

## 2012-03-06 ENCOUNTER — Ambulatory Visit: Payer: BC Managed Care – PPO | Admitting: Internal Medicine

## 2012-03-06 NOTE — Telephone Encounter (Signed)
Spoke with patient and advised results   

## 2012-03-14 ENCOUNTER — Other Ambulatory Visit: Payer: Self-pay

## 2012-03-25 ENCOUNTER — Encounter: Payer: Self-pay | Admitting: Internal Medicine

## 2012-03-26 ENCOUNTER — Telehealth: Payer: Self-pay | Admitting: Internal Medicine

## 2012-03-26 ENCOUNTER — Encounter: Payer: Self-pay | Admitting: Internal Medicine

## 2012-03-26 MED ORDER — DULOXETINE HCL 30 MG PO CPEP: 30.0000 mg | ORAL_CAPSULE | Freq: Every day | ORAL | Status: DC

## 2012-03-26 NOTE — Telephone Encounter (Signed)
I have called in her medication.

## 2012-03-26 NOTE — Telephone Encounter (Signed)
Please read below...

## 2012-03-26 NOTE — Telephone Encounter (Signed)
Patient informed. 

## 2012-03-26 NOTE — Telephone Encounter (Signed)
Wanting to get cymbalta - pharmacy says is now generic.  Pt states she has been talking with Dr. Dan Humphreys via MyChart.  Pt states the antidepressant she is on now is not working.  Pt states she used to be on cymbalta but stopped taking due to cost.  Pt states this is now generic and she would really like to get prescription to go back on this asap as she knows it has worked well for her in the past.  Pt uses [facility] [phone]. Please advise pt if this is possible.

## 2012-03-30 ENCOUNTER — Encounter: Payer: Self-pay | Admitting: Internal Medicine

## 2012-04-04 LAB — HM COLONOSCOPY

## 2012-04-06 ENCOUNTER — Ambulatory Visit: Payer: BC Managed Care – PPO | Admitting: Internal Medicine

## 2012-04-21 ENCOUNTER — Ambulatory Visit: Payer: Self-pay | Admitting: General Surgery

## 2012-04-21 ENCOUNTER — Telehealth: Payer: Self-pay | Admitting: Internal Medicine

## 2012-04-21 DIAGNOSIS — Z1211 Encounter for screening for malignant neoplasm of colon: Secondary | ICD-10-CM

## 2012-04-21 NOTE — Telephone Encounter (Signed)
Sure. We can give her samples of Bystolic.

## 2012-04-21 NOTE — Telephone Encounter (Signed)
Needing samples of Bystolic 10 mg

## 2012-04-21 NOTE — Telephone Encounter (Signed)
Is it ok to give her samples?

## 2012-04-21 NOTE — Telephone Encounter (Signed)
Samples up front and patient is aware.

## 2012-04-22 ENCOUNTER — Encounter: Payer: Self-pay | Admitting: General Surgery

## 2012-05-06 ENCOUNTER — Other Ambulatory Visit: Payer: Self-pay | Admitting: General Practice

## 2012-05-06 ENCOUNTER — Other Ambulatory Visit: Payer: Self-pay | Admitting: *Deleted

## 2012-05-06 DIAGNOSIS — I10 Essential (primary) hypertension: Secondary | ICD-10-CM

## 2012-05-06 MED ORDER — NEBIVOLOL HCL 10 MG PO TABS: 10.0000 mg | ORAL_TABLET | Freq: Every day | ORAL | Status: DC

## 2012-05-14 ENCOUNTER — Other Ambulatory Visit: Payer: Self-pay | Admitting: *Deleted

## 2012-05-14 DIAGNOSIS — M2550 Pain in unspecified joint: Secondary | ICD-10-CM

## 2012-05-14 DIAGNOSIS — H6692 Otitis media, unspecified, left ear: Secondary | ICD-10-CM

## 2012-05-14 DIAGNOSIS — G43909 Migraine, unspecified, not intractable, without status migrainosus: Secondary | ICD-10-CM

## 2012-05-14 DIAGNOSIS — I10 Essential (primary) hypertension: Secondary | ICD-10-CM

## 2012-05-14 DIAGNOSIS — G4700 Insomnia, unspecified: Secondary | ICD-10-CM

## 2012-05-14 MED ORDER — LOSARTAN POTASSIUM-HCTZ 100-12.5 MG PO TABS: 1.0000 | ORAL_TABLET | Freq: Every day | ORAL | Status: DC

## 2012-05-14 MED ORDER — NAPROXEN 500 MG PO TABS: 500.0000 mg | ORAL_TABLET | Freq: Every day | ORAL | Status: DC | PRN

## 2012-05-14 MED ORDER — SUMATRIPTAN SUCCINATE 100 MG PO TABS: 100.0000 mg | ORAL_TABLET | Freq: Every day | ORAL | Status: DC | PRN

## 2012-05-14 MED ORDER — SUMATRIPTAN SUCCINATE 6 MG/0.5ML ~~LOC~~ SOLN: 6.0000 mg | SUBCUTANEOUS | Status: DC | PRN

## 2012-05-14 MED ORDER — NEBIVOLOL HCL 10 MG PO TABS: 10.0000 mg | ORAL_TABLET | Freq: Every day | ORAL | Status: DC

## 2012-05-14 MED ORDER — DULOXETINE HCL 30 MG PO CPEP: 30.0000 mg | ORAL_CAPSULE | Freq: Every day | ORAL | Status: DC

## 2012-05-25 ENCOUNTER — Telehealth: Payer: Self-pay | Admitting: Internal Medicine

## 2012-05-25 MED ORDER — DULOXETINE HCL 30 MG PO CPEP: 30.0000 mg | ORAL_CAPSULE | Freq: Every day | ORAL | Status: DC

## 2012-05-25 NOTE — Telephone Encounter (Signed)
The mail order received all of the patient's medications except the DULoxetine (CYMBALTA) 30 MG capsule.  Can you resend this medication to the mail order.

## 2012-05-25 NOTE — Telephone Encounter (Signed)
Rx re-sent to mail-order pharmacy.

## 2012-06-15 ENCOUNTER — Other Ambulatory Visit: Payer: Self-pay | Admitting: *Deleted

## 2012-06-15 DIAGNOSIS — G4700 Insomnia, unspecified: Secondary | ICD-10-CM

## 2012-06-15 MED ORDER — ZOLPIDEM TARTRATE 10 MG PO TABS: 10.0000 mg | ORAL_TABLET | Freq: Every evening | ORAL | Status: DC | PRN

## 2012-07-01 ENCOUNTER — Encounter: Payer: Self-pay | Admitting: Internal Medicine

## 2012-07-01 ENCOUNTER — Ambulatory Visit (INDEPENDENT_AMBULATORY_CARE_PROVIDER_SITE_OTHER): Payer: Managed Care, Other (non HMO) | Admitting: Internal Medicine

## 2012-07-01 VITALS — BP 120/70 | HR 61 | Temp 98.1°F | Resp 12 | Wt 195.0 lb

## 2012-07-01 DIAGNOSIS — J189 Pneumonia, unspecified organism: Secondary | ICD-10-CM

## 2012-07-01 MED ORDER — LEVOFLOXACIN 750 MG PO TABS: 750.0000 mg | ORAL_TABLET | Freq: Every day | ORAL | Status: AC

## 2012-07-01 MED ORDER — FLUCONAZOLE 150 MG PO TABS: 150.0000 mg | ORAL_TABLET | Freq: Every day | ORAL | Status: DC

## 2012-07-01 NOTE — Progress Notes (Signed)
Subjective:    Patient ID: [name], female    DOB: [birth year], 56 y.o.   MRN: [id number]  HPI 57YO female presents for acute visit c/o 3 weeks of cough, congestion, fatigue. Initially did not have fever, but over last few days, notes subjective fever and chills. Taking OTC cough and cold medications with no improvement. No chest pain or dyspnea. Notes several sick contacts at work.  Outpatient Encounter Prescriptions as of 07/01/2012  Medication Sig Dispense Refill  . DULoxetine (CYMBALTA) 30 MG capsule Take 1 capsule (30 mg total) by mouth daily.  90 capsule  0  . losartan-hydrochlorothiazide (HYZAAR) 100-12.5 MG per tablet Take 1 tablet by mouth daily.  90 tablet  1  . naproxen (NAPROSYN) 500 MG tablet Take 1 tablet (500 mg total) by mouth daily as needed.  90 tablet  1  . nebivolol (BYSTOLIC) 10 MG tablet Take 1 tablet (10 mg total) by mouth daily.  90 tablet  1  . SUMAtriptan (IMITREX) 100 MG tablet Take 1 tablet (100 mg total) by mouth daily as needed.  30 tablet  0  . SUMAtriptan (IMITREX) 6 MG/0.5ML SOLN injection Inject 0.5 mLs (6 mg total) into the skin every 2 (two) hours as needed.  1.5 mL  0  . zolpidem (AMBIEN) 10 MG tablet Take 1 tablet (10 mg total) by mouth at bedtime as needed.  90 tablet  1  . fluconazole (DIFLUCAN) 150 MG tablet Take 1 tablet (150 mg total) by mouth daily.  3 tablet  0  . levofloxacin (LEVAQUIN) 750 MG tablet Take 1 tablet (750 mg total) by mouth daily.  7 tablet  0  . [DISCONTINUED] azithromycin (ZITHROMAX Z-PAK) 250 MG tablet Take 2 pills day 1, then 1 pill daily days 2-5  6 each  0  . [DISCONTINUED] buPROPion (WELLBUTRIN XL) 150 MG 24 hr tablet Take 1 tablet (150 mg total) by mouth every morning.  30 tablet  6   No facility-administered encounter medications on file as of 07/01/2012.   BP 120/70  Pulse 61  Temp(Src) 98.1 F (36.7 C) (Oral)  Resp 12  Wt 195 lb (88.451 kg)  BMI 32.69 kg/m2  SpO2 97%  Review of Systems  Constitutional: Positive  for fever. Negative for chills and unexpected weight change.  HENT: Positive for congestion and sinus pressure. Negative for hearing loss, ear pain, nosebleeds, sore throat, facial swelling, rhinorrhea, sneezing, mouth sores, trouble swallowing, neck pain, neck stiffness, voice change, postnasal drip, tinnitus and ear discharge.   Eyes: Negative for pain, discharge, redness and visual disturbance.  Respiratory: Positive for cough. Negative for chest tightness, shortness of breath, wheezing and stridor.   Cardiovascular: Negative for chest pain, palpitations and leg swelling.  Musculoskeletal: Negative for myalgias and arthralgias.  Skin: Negative for color change and rash.  Neurological: Negative for dizziness, weakness, light-headedness and headaches.  Hematological: Negative for adenopathy.       Objective:   Physical Exam  Constitutional: She is oriented to person, place, and time. She appears well-developed and well-nourished. No distress.  HENT:  Head: Normocephalic and atraumatic.  Right Ear: External ear normal.  Left Ear: External ear normal.  Nose: Nose normal.  Mouth/Throat: Oropharynx is clear and moist. No oropharyngeal exudate.  Eyes: Conjunctivae are normal. Pupils are equal, round, and reactive to light. Right eye exhibits no discharge. Left eye exhibits no discharge. No scleral icterus.  Neck: Normal range of motion. Neck supple. No tracheal deviation present. No thyromegaly present.  Cardiovascular: Normal  rate, regular rhythm, normal heart sounds and intact distal pulses.  Exam reveals no gallop and no friction rub.   No murmur heard. Pulmonary/Chest: Effort normal. No accessory muscle usage. Not tachypneic. No respiratory distress. She has no decreased breath sounds. She has no wheezes. She has rhonchi in the left lower field. She has no rales. She exhibits no tenderness.  Musculoskeletal: Normal range of motion. She exhibits no edema and no tenderness.  Lymphadenopathy:     She has no cervical adenopathy.  Neurological: She is alert and oriented to person, place, and time. No cranial nerve deficit. She exhibits normal muscle tone. Coordination normal.  Skin: Skin is warm and dry. No rash noted. She is not diaphoretic. No erythema. No pallor.  Psychiatric: She has a normal mood and affect. Her behavior is normal. Judgment and thought content normal.          Assessment & Plan:

## 2012-07-01 NOTE — Assessment & Plan Note (Signed)
Symptoms and exam are consistent with CAP. Will treat with Levaquin and follow up in 1 week. If no improvement, recurrent fever, then pt will call and we will get CXR.

## 2012-07-08 ENCOUNTER — Ambulatory Visit: Payer: Managed Care, Other (non HMO) | Admitting: Internal Medicine

## 2012-08-03 ENCOUNTER — Encounter: Payer: Self-pay | Admitting: Internal Medicine

## 2012-08-04 ENCOUNTER — Other Ambulatory Visit: Payer: Self-pay | Admitting: *Deleted

## 2012-08-04 DIAGNOSIS — I10 Essential (primary) hypertension: Secondary | ICD-10-CM

## 2012-08-04 MED ORDER — NEBIVOLOL HCL 10 MG PO TABS: 10.0000 mg | ORAL_TABLET | Freq: Every day | ORAL | Status: DC

## 2012-08-04 MED ORDER — LOSARTAN POTASSIUM-HCTZ 100-12.5 MG PO TABS: 1.0000 | ORAL_TABLET | Freq: Every day | ORAL | Status: DC

## 2012-08-17 ENCOUNTER — Telehealth: Payer: Self-pay | Admitting: Internal Medicine

## 2012-08-17 ENCOUNTER — Other Ambulatory Visit: Payer: Self-pay | Admitting: Internal Medicine

## 2012-08-17 DIAGNOSIS — G43909 Migraine, unspecified, not intractable, without status migrainosus: Secondary | ICD-10-CM

## 2012-08-17 NOTE — Telephone Encounter (Signed)
[facility] = Ph: [phone]  Fx:  [phone]   Pt says her Imitrex was refilled as injectables but should have been vials.  Pt states she cannot use injectables.  States she usually gets 2 packs with 2 in each pack = 4 shots.

## 2012-08-17 NOTE — Telephone Encounter (Signed)
Spoke with pharmacist Jonny Ruiz at [facility] and he stated the patient did return the vials in May and they did not hear from her until today. This was their first time filling this medication for her and they were not sure of how to dispense it. It has been corrected, they will ship her the pre-filled syringes.

## 2012-08-25 MED ORDER — DULOXETINE HCL 30 MG PO CPEP: 30.0000 mg | ORAL_CAPSULE | Freq: Every day | ORAL | Status: DC

## 2012-10-09 ENCOUNTER — Telehealth: Payer: Self-pay | Admitting: Internal Medicine

## 2012-10-09 DIAGNOSIS — I10 Essential (primary) hypertension: Secondary | ICD-10-CM

## 2012-10-09 MED ORDER — NEBIVOLOL HCL 10 MG PO TABS: 10.0000 mg | ORAL_TABLET | Freq: Every day | ORAL | Status: DC

## 2012-10-09 MED ORDER — METOPROLOL SUCCINATE ER 25 MG PO TB24: 25.0000 mg | ORAL_TABLET | Freq: Every day | ORAL | Status: DC

## 2012-10-09 NOTE — Telephone Encounter (Signed)
Pt states it has been two weeks and [facility] has not received orders for her BP meds, Bystolic and losartan.  States it takes about 3 weeks for [facility] to process and get meds to her.  Pt is almost out of medication but thinks she may have about 3 weeks of the meds left.

## 2012-10-09 NOTE — Telephone Encounter (Signed)
We have not received a request for any medications from the pharmacy. Left message on patient voicemail informing her of this and I did send a 90 day supply to the pharmacy

## 2012-10-26 ENCOUNTER — Encounter: Payer: Self-pay | Admitting: Internal Medicine

## 2012-11-06 ENCOUNTER — Other Ambulatory Visit: Payer: Self-pay | Admitting: *Deleted

## 2012-11-06 DIAGNOSIS — I10 Essential (primary) hypertension: Secondary | ICD-10-CM

## 2012-11-06 MED ORDER — LOSARTAN POTASSIUM-HCTZ 100-12.5 MG PO TABS: 1.0000 | ORAL_TABLET | Freq: Every day | ORAL | Status: DC

## 2012-11-06 MED ORDER — NEBIVOLOL HCL 10 MG PO TABS: 10.0000 mg | ORAL_TABLET | Freq: Every day | ORAL | Status: DC

## 2012-11-06 NOTE — Telephone Encounter (Signed)
Prescription for Bystolic and Losartan re-sent to [facility]

## 2012-11-13 ENCOUNTER — Other Ambulatory Visit: Payer: Self-pay | Admitting: Internal Medicine

## 2012-12-03 ENCOUNTER — Other Ambulatory Visit: Payer: Self-pay

## 2013-01-03 ENCOUNTER — Other Ambulatory Visit: Payer: Self-pay | Admitting: Internal Medicine

## 2013-01-03 DIAGNOSIS — I10 Essential (primary) hypertension: Secondary | ICD-10-CM

## 2013-01-03 DIAGNOSIS — G4700 Insomnia, unspecified: Secondary | ICD-10-CM

## 2013-01-04 ENCOUNTER — Encounter: Payer: Self-pay | Admitting: Internal Medicine

## 2013-01-04 MED ORDER — NEBIVOLOL HCL 10 MG PO TABS: 10.0000 mg | ORAL_TABLET | Freq: Every day | ORAL | Status: DC

## 2013-01-04 MED ORDER — LOSARTAN POTASSIUM-HCTZ 100-12.5 MG PO TABS: 1.0000 | ORAL_TABLET | Freq: Every day | ORAL | Status: DC

## 2013-01-04 MED ORDER — ZOLPIDEM TARTRATE 10 MG PO TABS: 10.0000 mg | ORAL_TABLET | Freq: Every evening | ORAL | Status: DC | PRN

## 2013-01-04 NOTE — Telephone Encounter (Signed)
Ok refill Zolpidem?

## 2013-01-12 ENCOUNTER — Telehealth: Payer: Self-pay | Admitting: Internal Medicine

## 2013-01-12 NOTE — Telephone Encounter (Signed)
Pt states [facility] does not have authorization for Ambien.  States she did receive her BP meds.  Please re-fax script for Ambien.

## 2013-01-14 NOTE — Telephone Encounter (Signed)
Prescription re-faxed yesterday

## 2013-01-18 ENCOUNTER — Other Ambulatory Visit: Payer: Self-pay | Admitting: *Deleted

## 2013-01-18 DIAGNOSIS — G4700 Insomnia, unspecified: Secondary | ICD-10-CM

## 2013-01-25 ENCOUNTER — Telehealth: Payer: Self-pay | Admitting: Internal Medicine

## 2013-01-25 ENCOUNTER — Encounter: Payer: Self-pay | Admitting: Internal Medicine

## 2013-01-25 NOTE — Telephone Encounter (Signed)
The patient is wanting the nurse to call [facility] to refill her prescription for her Zolpidem # [phone]. She has been trying to fill this prescription since 12.8.14.

## 2013-01-26 NOTE — Telephone Encounter (Signed)
Patient called back this morning to give me a complaint in regards to this medication. She states that she called [facility] last night and it still hasn't been called in. I have called the number that she requested [place] to call so I can find out exactly what they need from [place]. I called and spoke with Reuel Boom at [facility], he just needed to verify that the prescription did in fact come from our office, he will process that today. I have also called the patient to let her know that I have spoke with Reuel Boom and that they would process this request.  Patient was happy that it has been taken care of.

## 2013-01-26 NOTE — Telephone Encounter (Signed)
Noted  

## 2013-03-02 ENCOUNTER — Other Ambulatory Visit: Payer: Self-pay | Admitting: Internal Medicine

## 2013-03-03 ENCOUNTER — Other Ambulatory Visit: Payer: Self-pay | Admitting: *Deleted

## 2013-03-03 DIAGNOSIS — G4700 Insomnia, unspecified: Secondary | ICD-10-CM

## 2013-03-03 MED ORDER — ZOLPIDEM TARTRATE 10 MG PO TABS: 10.0000 mg | ORAL_TABLET | Freq: Every evening | ORAL | Status: DC | PRN

## 2013-03-03 NOTE — Telephone Encounter (Signed)
Last visit 07/01/12, refill?

## 2013-03-11 ENCOUNTER — Other Ambulatory Visit: Payer: Self-pay | Admitting: Internal Medicine

## 2013-03-15 ENCOUNTER — Other Ambulatory Visit: Payer: Self-pay | Admitting: Internal Medicine

## 2013-04-19 ENCOUNTER — Other Ambulatory Visit: Payer: Self-pay | Admitting: *Deleted

## 2013-04-19 DIAGNOSIS — G43909 Migraine, unspecified, not intractable, without status migrainosus: Secondary | ICD-10-CM

## 2013-04-19 MED ORDER — SUMATRIPTAN SUCCINATE 6 MG/0.5ML ~~LOC~~ SOLN: 6.0000 mg | SUBCUTANEOUS | Status: DC | PRN

## 2013-04-21 ENCOUNTER — Other Ambulatory Visit: Payer: Self-pay | Admitting: *Deleted

## 2013-04-21 DIAGNOSIS — G43909 Migraine, unspecified, not intractable, without status migrainosus: Secondary | ICD-10-CM

## 2013-04-21 MED ORDER — SUMATRIPTAN SUCCINATE 100 MG PO TABS: 100.0000 mg | ORAL_TABLET | Freq: Every day | ORAL | Status: DC | PRN

## 2013-04-21 NOTE — Telephone Encounter (Signed)
Ok to fill 

## 2013-04-27 ENCOUNTER — Other Ambulatory Visit: Payer: Self-pay | Admitting: Internal Medicine

## 2013-04-27 DIAGNOSIS — G4700 Insomnia, unspecified: Secondary | ICD-10-CM

## 2013-04-27 NOTE — Telephone Encounter (Signed)
Please advise 

## 2013-04-28 MED ORDER — NEBIVOLOL HCL 10 MG PO TABS: ORAL_TABLET | ORAL | Status: DC

## 2013-04-28 MED ORDER — LOSARTAN POTASSIUM-HCTZ 100-12.5 MG PO TABS: ORAL_TABLET | ORAL | Status: DC

## 2013-04-28 MED ORDER — ZOLPIDEM TARTRATE 10 MG PO TABS: 10.0000 mg | ORAL_TABLET | Freq: Every evening | ORAL | Status: DC | PRN

## 2013-04-28 NOTE — Telephone Encounter (Signed)
Patient was sent a MyChart message.

## 2013-04-28 NOTE — Telephone Encounter (Signed)
See myChart message, last visit 07/01/12, refill?

## 2013-04-28 NOTE — Telephone Encounter (Signed)
Fine to call in refills. We should set up visit to discuss change in dose of Cymbalta.

## 2013-05-11 ENCOUNTER — Telehealth: Payer: Self-pay | Admitting: *Deleted

## 2013-05-11 NOTE — Telephone Encounter (Signed)
Received a message from [facility] requesting clarification on patient Imitrex prescription. The prescription they received was not the usual, patient usually gets 2 kits. Verbal ok given for patient to receive the 2 kits.

## 2013-07-29 ENCOUNTER — Encounter: Payer: Managed Care, Other (non HMO) | Admitting: Internal Medicine

## 2013-08-04 ENCOUNTER — Encounter: Payer: Self-pay | Admitting: Internal Medicine

## 2013-08-04 ENCOUNTER — Ambulatory Visit (INDEPENDENT_AMBULATORY_CARE_PROVIDER_SITE_OTHER): Payer: Managed Care, Other (non HMO) | Admitting: Internal Medicine

## 2013-08-04 VITALS — BP 118/80 | HR 61 | Temp 97.7°F | Ht 64.5 in | Wt 192.5 lb

## 2013-08-04 DIAGNOSIS — I10 Essential (primary) hypertension: Secondary | ICD-10-CM

## 2013-08-04 DIAGNOSIS — E669 Obesity, unspecified: Secondary | ICD-10-CM

## 2013-08-04 DIAGNOSIS — Z0000 Encounter for general adult medical examination without abnormal findings: Secondary | ICD-10-CM

## 2013-08-04 DIAGNOSIS — G4700 Insomnia, unspecified: Secondary | ICD-10-CM

## 2013-08-04 DIAGNOSIS — F32A Depression, unspecified: Secondary | ICD-10-CM

## 2013-08-04 DIAGNOSIS — Z4800 Encounter for change or removal of nonsurgical wound dressing: Secondary | ICD-10-CM

## 2013-08-04 DIAGNOSIS — J189 Pneumonia, unspecified organism: Secondary | ICD-10-CM

## 2013-08-04 DIAGNOSIS — F3289 Other specified depressive episodes: Secondary | ICD-10-CM

## 2013-08-04 DIAGNOSIS — M2550 Pain in unspecified joint: Secondary | ICD-10-CM

## 2013-08-04 DIAGNOSIS — F329 Major depressive disorder, single episode, unspecified: Secondary | ICD-10-CM

## 2013-08-04 LAB — HEPATIC FUNCTION PANEL
ALT: 50 U/L — AB (ref 7–35)
AST: 41 U/L — AB (ref 13–35)
Alkaline Phosphatase: 124 U/L (ref 25–125)
Bilirubin, Total: 0.4 mg/dL

## 2013-08-04 LAB — BASIC METABOLIC PANEL
BUN: 12 mg/dL (ref 4–21)
Creatinine: 0.6 mg/dL (ref <=1.1)
Glucose: 107 mg/dL
Potassium: 4.6 mmol/L (ref 3.4–5.3)
Sodium: 139 mmol/L (ref 137–147)

## 2013-08-04 LAB — LIPID PANEL
Cholesterol: 244 mg/dL — AB (ref 0–200)
HDL: 43 mg/dL (ref 35–70)
LDL Cholesterol: 150 mg/dL
TRIGLYCERIDES: 255 mg/dL — AB (ref 40–160)

## 2013-08-04 LAB — HEMOGLOBIN A1C: HEMOGLOBIN A1C: 5.8 % (ref 4.0–6.0)

## 2013-08-04 LAB — TSH: TSH: 1.58 u[IU]/mL (ref <=5.90)

## 2013-08-04 MED ORDER — FLUCONAZOLE 150 MG PO TABS: 150.0000 mg | ORAL_TABLET | ORAL | Status: DC

## 2013-08-04 MED ORDER — LOSARTAN POTASSIUM-HCTZ 100-12.5 MG PO TABS: ORAL_TABLET | ORAL | Status: DC

## 2013-08-04 MED ORDER — ZOLPIDEM TARTRATE 10 MG PO TABS: 10.0000 mg | ORAL_TABLET | Freq: Every evening | ORAL | Status: DC | PRN

## 2013-08-04 MED ORDER — DULOXETINE HCL 60 MG PO CPEP: 60.0000 mg | ORAL_CAPSULE | Freq: Every day | ORAL | Status: DC

## 2013-08-04 MED ORDER — PHENTERMINE HCL 37.5 MG PO CAPS: 37.5000 mg | ORAL_CAPSULE | ORAL | Status: DC

## 2013-08-04 MED ORDER — NAPROXEN 500 MG PO TABS: 500.0000 mg | ORAL_TABLET | Freq: Every day | ORAL | Status: DC | PRN

## 2013-08-04 MED ORDER — NEBIVOLOL HCL 10 MG PO TABS: ORAL_TABLET | ORAL | Status: DC

## 2013-08-04 NOTE — Assessment & Plan Note (Signed)
Wt Readings from Last 3 Encounters:  08/04/13 192 lb 8 oz (87.317 kg)  07/01/12 195 lb (88.451 kg)  03/04/12 190 lb (86.183 kg)   Body mass index is 32.54 kg/(m^2). Encouraged healthy diet and exercise. Will start phentermine for appetite suppression. Discussed risk of elevated BP on this medication. She will monitor BP daily and email with readings. Follow up in 4 weeks.

## 2013-08-04 NOTE — Assessment & Plan Note (Signed)
Continue Ambien. 

## 2013-08-04 NOTE — Progress Notes (Signed)
Pre visit review using our clinic review tool, if applicable. No additional management support is needed unless otherwise documented below in the visit note. 

## 2013-08-04 NOTE — Assessment & Plan Note (Signed)
General medical exam including breast exam normal today. PAP and pelvic deferred as PAP normal, HPV neg in 2013. Pt is s/p hysterectomy. Mammogram ordered. Colonoscopy UTD and reviewed. Labs today including CBC, CMP, lipids, TSH, A1c. Immunizations are UTD except for Zostavax. Prefers to hold off on this for now.

## 2013-08-04 NOTE — Patient Instructions (Addendum)
Start Phentermine 37.5mg  every morning to help with appetite.  Please monitor blood pressure daily and email with readings.  Follow up in 1 month.

## 2013-08-04 NOTE — Progress Notes (Signed)
Subjective:    Patient ID: Mary SpanielShelby E Dymek, female    DOB: [birth year], 58 y.o.   MRN: [id number]  HPI 58YO female presents for annual exam. Difficult time for her. Tearful today describing events of the last year. Husband diagnosed last August with ALS. She is working full time and caring for him. She feels that he is close to death, as oxygen levels have been poor. Her work will be increasing rates on her insurance if she does not demonstrate that she is trying to lose weight. She has been working full time and caring for her husband, so has not had time for exercise. She has been preparing higher calorie foods to help her husband gain weight. She has used phentermine in the past to help with appetite and would like to try this again. Her last PAP was in 2013 and was normal. She is s/p hysterectomy. Last mammogram was in 2013.    Review of Systems  Constitutional: Negative for fever, chills, appetite change, fatigue and unexpected weight change.  HENT: Negative for hearing loss and trouble swallowing.   Eyes: Negative for visual disturbance.  Respiratory: Negative for cough, shortness of breath and wheezing.   Cardiovascular: Negative for chest pain and leg swelling.  Gastrointestinal: Negative for nausea, vomiting, abdominal pain, diarrhea and constipation.  Genitourinary: Negative for vaginal bleeding, vaginal discharge and vaginal pain.  Musculoskeletal: Negative for arthralgias and myalgias.  Skin: Negative for color change and rash.  Neurological: Negative for weakness and light-headedness.  Hematological: Negative for adenopathy. Does not bruise/bleed easily.  Psychiatric/Behavioral: Positive for sleep disturbance and dysphoric mood. Negative for suicidal ideas. The patient is nervous/anxious.        Objective:    BP 118/80  Pulse 61  Temp(Src) 97.7 F (36.5 C) (Oral)  Ht 5' 4.5" (1.638 m)  Wt 192 lb 8 oz (87.317 kg)  BMI 32.54 kg/m2  SpO2 97% Physical Exam    Constitutional: She is oriented to person, place, and time. She appears well-developed and well-nourished. No distress.  HENT:  Head: Normocephalic and atraumatic.  Right Ear: External ear normal.  Left Ear: External ear normal.  Nose: Nose normal.  Mouth/Throat: Oropharynx is clear and moist. No oropharyngeal exudate.  Eyes: Conjunctivae are normal. Pupils are equal, round, and reactive to light. Right eye exhibits no discharge. Left eye exhibits no discharge. No scleral icterus.  Neck: Normal range of motion. Neck supple. No tracheal deviation present. No thyromegaly present.  Cardiovascular: Normal rate, regular rhythm, normal heart sounds and intact distal pulses.  Exam reveals no gallop and no friction rub.   No murmur heard. Pulmonary/Chest: Effort normal and breath sounds normal. No accessory muscle usage. Not tachypneic. No respiratory distress. She has no decreased breath sounds. She has no wheezes. She has no rhonchi. She has no rales. She exhibits no tenderness. Right breast exhibits no inverted nipple, no mass, no nipple discharge, no skin change and no tenderness. Left breast exhibits no inverted nipple, no mass, no nipple discharge, no skin change and no tenderness. Breasts are symmetrical.  Abdominal: Soft. Bowel sounds are normal. She exhibits no distension and no mass. There is no tenderness. There is no rebound and no guarding.  Musculoskeletal: Normal range of motion. She exhibits no edema and no tenderness.  Lymphadenopathy:    She has no cervical adenopathy.  Neurological: She is alert and oriented to person, place, and time. No cranial nerve deficit. She exhibits normal muscle tone. Coordination normal.  Skin: Skin is  warm and dry. No rash noted. She is not diaphoretic. No erythema. No pallor.  Psychiatric: Her speech is normal and behavior is normal. Judgment and thought content normal. Her mood appears anxious. Cognition and memory are normal. She exhibits a depressed  mood. She expresses no suicidal ideation.          Assessment & Plan:   Problem List Items Addressed This Visit     Unprioritized   Arthralgia   Relevant Medications      naproxen (NAPROSYN) tablet   Depression     Recent worsening symptoms of depression with husband's diagnosis of ALS. Will increase Cymbalta to 60mg  daily. We discussed adding short acting anxiolytic such as alprazolam, but she prefers to hold off for now.    Relevant Medications      DULoxetine (CYMBALTA) DR capsule   Hypertension      BP Readings from Last 3 Encounters:  08/04/13 118/80  07/01/12 120/70  03/04/12 120/88   BP well controlled. We discussed importance of close monitoring while on phentermine. She will check BP daily and email with readings.    Relevant Medications      nebivolol (BYSTOLIC) tablet      losartan-hydrochlorothiazide (HYZAAR) 100-12.5 MG per tablet   Insomnia     Continue Ambien    Relevant Medications      zolpidem (AMBIEN) tablet   Obesity (BMI 30-39.9)      Wt Readings from Last 3 Encounters:  08/04/13 192 lb 8 oz (87.317 kg)  07/01/12 195 lb (88.451 kg)  03/04/12 190 lb (86.183 kg)   Body mass index is 32.54 kg/(m^2). Encouraged healthy diet and exercise. Will start phentermine for appetite suppression. Discussed risk of elevated BP on this medication. She will monitor BP daily and email with readings. Follow up in 4 weeks.    Relevant Medications      phentermine capsule   Routine general medical examination at a health care facility - Primary     General medical exam including breast exam normal today. PAP and pelvic deferred as PAP normal, HPV neg in 2013. Pt is s/p hysterectomy. Mammogram ordered. Colonoscopy UTD and reviewed. Labs today including CBC, CMP, lipids, TSH, A1c. Immunizations are UTD except for Zostavax. Prefers to hold off on this for now.    Relevant Orders      MM Digital Screening    Other Visit Diagnoses   CAP (community acquired pneumonia)         Relevant Medications       fluconazole (DIFLUCAN) tablet 150 mg        Return in about 4 weeks (around 09/01/2013).

## 2013-08-04 NOTE — Assessment & Plan Note (Signed)
Recent worsening symptoms of depression with husband's diagnosis of ALS. Will increase Cymbalta to 60mg  daily. We discussed adding short acting anxiolytic such as alprazolam, but she prefers to hold off for now.

## 2013-08-04 NOTE — Assessment & Plan Note (Signed)
BP Readings from Last 3 Encounters:  08/04/13 118/80  07/01/12 120/70  03/04/12 120/88   BP well controlled. We discussed importance of close monitoring while on phentermine. She will check BP daily and email with readings.

## 2013-08-06 ENCOUNTER — Telehealth: Payer: Self-pay | Admitting: Internal Medicine

## 2013-08-06 NOTE — Telephone Encounter (Signed)
Notified pt. 

## 2013-08-06 NOTE — Telephone Encounter (Signed)
Labs show mild elevation of liver function tests, elevated cholesterol, and low Vit D. I would like to start Vit D 2000units daily which is available over the counter.  We should recheck liver function tests in 1 month.

## 2013-08-09 ENCOUNTER — Encounter: Payer: Self-pay | Admitting: *Deleted

## 2013-08-16 ENCOUNTER — Encounter: Payer: Self-pay | Admitting: Internal Medicine

## 2013-09-17 ENCOUNTER — Ambulatory Visit (INDEPENDENT_AMBULATORY_CARE_PROVIDER_SITE_OTHER): Payer: Managed Care, Other (non HMO) | Admitting: Internal Medicine

## 2013-09-17 ENCOUNTER — Encounter: Payer: Self-pay | Admitting: Internal Medicine

## 2013-09-17 VITALS — BP 118/66 | HR 71 | Ht 64.5 in | Wt 186.2 lb

## 2013-09-17 DIAGNOSIS — E669 Obesity, unspecified: Secondary | ICD-10-CM

## 2013-09-17 DIAGNOSIS — R200 Anesthesia of skin: Secondary | ICD-10-CM | POA: Insufficient documentation

## 2013-09-17 DIAGNOSIS — R209 Unspecified disturbances of skin sensation: Secondary | ICD-10-CM

## 2013-09-17 DIAGNOSIS — F3289 Other specified depressive episodes: Secondary | ICD-10-CM

## 2013-09-17 DIAGNOSIS — F32A Depression, unspecified: Secondary | ICD-10-CM

## 2013-09-17 DIAGNOSIS — F329 Major depressive disorder, single episode, unspecified: Secondary | ICD-10-CM

## 2013-09-17 MED ORDER — PHENTERMINE HCL 37.5 MG PO CAPS: 37.5000 mg | ORAL_CAPSULE | ORAL | Status: DC

## 2013-09-17 NOTE — Progress Notes (Signed)
Pre visit review using our clinic review tool, if applicable. No additional management support is needed unless otherwise documented below in the visit note. 

## 2013-09-17 NOTE — Patient Instructions (Signed)
Continue Phentermine.  Follow up in 4-8 weeks.

## 2013-09-17 NOTE — Assessment & Plan Note (Signed)
Offered support today. Horrible situation with husband dying from ALS at home. Will continue Cymbalta, increase to 60mg  daily. Follow up in 4-8 weeks.

## 2013-09-17 NOTE — Assessment & Plan Note (Addendum)
Wt Readings from Last 3 Encounters:  09/17/13 186 lb 4 oz (84.482 kg)  08/04/13 192 lb 8 oz (87.317 kg)  07/01/12 195 lb (88.451 kg)   Body mass index is 31.49 kg/(m^2). Congratulated pt on weight loss. Will continue phentermine for appetite suppression. Follow up in 4-8 weeks.

## 2013-09-17 NOTE — Assessment & Plan Note (Signed)
Symptoms most consistent with peripheral neuropathy. Will check B12 with labs. We discussed possible screen for heavy metals, however she only drinks bottled water at home. Follow up in 4-8 weeks.

## 2013-09-17 NOTE — Progress Notes (Signed)
Subjective:    Patient ID: [name], female    DOB: 05-10-1955, 58 y.o.   MRN: [id number]  HPI 58YO female presents for follow up.  At last visit, we increased dose of Cymbalta to 60mg  daily. She has not yet started the higher dose.  She has been taking phentermine to help with weight loss. Has lost about 6lb since last visit.  No side effects noted from the medication.   Continues to be a very difficult time for her. Her husband is dying from ALS. Her daughter is caring for him during the day and she cares for him at night. He is unable to speak to her or swallow. She feeds him through a tube. She is tearful describing this.  Over the last few months, she has occasionally had some numbness or tingling in her feet. Recent labs showed normal blood sugars and thyroid function. She does drink well water.  Review of Systems  Constitutional: Negative for fever, chills, appetite change, fatigue and unexpected weight change.  Eyes: Negative for visual disturbance.  Respiratory: Negative for shortness of breath.   Cardiovascular: Negative for chest pain and leg swelling.  Gastrointestinal: Negative for abdominal pain.  Skin: Negative for color change and rash.  Hematological: Negative for adenopathy. Does not bruise/bleed easily.  Psychiatric/Behavioral: Positive for sleep disturbance and dysphoric mood. Negative for suicidal ideas. The patient is nervous/anxious.        Objective:    BP 118/66  Pulse 71  Ht 5' 4.5" (1.638 m)  Wt 186 lb 4 oz (84.482 kg)  BMI 31.49 kg/m2  SpO2 97% Physical Exam  Constitutional: She is oriented to person, place, and time. She appears well-developed and well-nourished. No distress.  HENT:  Head: Normocephalic and atraumatic.  Right Ear: External ear normal.  Left Ear: External ear normal.  Nose: Nose normal.  Mouth/Throat: Oropharynx is clear and moist. No oropharyngeal exudate.  Eyes: Conjunctivae are normal. Pupils are equal, round, and  reactive to light. Right eye exhibits no discharge. Left eye exhibits no discharge. No scleral icterus.  Neck: Normal range of motion. Neck supple. No tracheal deviation present. No thyromegaly present.  Cardiovascular: Normal rate, regular rhythm, normal heart sounds and intact distal pulses.  Exam reveals no gallop and no friction rub.   No murmur heard. Pulmonary/Chest: Effort normal and breath sounds normal. No accessory muscle usage. Not tachypneic. No respiratory distress. She has no decreased breath sounds. She has no wheezes. She has no rhonchi. She has no rales. She exhibits no tenderness.  Musculoskeletal: Normal range of motion. She exhibits no edema and no tenderness.  Lymphadenopathy:    She has no cervical adenopathy.  Neurological: She is alert and oriented to person, place, and time. No cranial nerve deficit. She exhibits normal muscle tone. Coordination normal.  Skin: Skin is warm and dry. No rash noted. She is not diaphoretic. No erythema. No pallor.  Psychiatric: Her speech is normal and behavior is normal. Judgment and thought content normal. Her mood appears anxious. She exhibits a depressed mood. She expresses no suicidal ideation.          Assessment & Plan:   Problem List Items Addressed This Visit     Unprioritized   Depression     Offered support today. Horrible situation with husband dying from ALS at home. Will continue Cymbalta, increase to 60mg  daily. Follow up in 4-8 weeks.    Numbness     Symptoms most consistent with peripheral neuropathy. Will  check B12 with labs. We discussed possible screen for heavy metals, however she only drinks bottled water at home. Follow up in 4-8 weeks.    Obesity (BMI 30-39.9) - Primary      Wt Readings from Last 3 Encounters:  09/17/13 186 lb 4 oz (84.482 kg)  08/04/13 192 lb 8 oz (87.317 kg)  07/01/12 195 lb (88.451 kg)   Body mass index is 31.49 kg/(m^2). Congratulated pt on weight loss. Will continue phentermine for  appetite suppression. Follow up in 4-8 weeks.    Relevant Medications      phentermine capsule       Return in about 4 weeks (around 10/15/2013) for Recheck Weight.

## 2013-10-05 LAB — BASIC METABOLIC PANEL
BUN: 16 mg/dL (ref 4–21)
CREATININE: 0.6 mg/dL (ref 0.5–1.1)
GLUCOSE: 133 mg/dL
Potassium: 3.8 mmol/L (ref 3.4–5.3)
Sodium: 143 mmol/L (ref 137–147)

## 2013-10-05 LAB — HEPATIC FUNCTION PANEL
ALT: 37 U/L — AB (ref 7–35)
AST: 32 U/L (ref 13–35)
Alkaline Phosphatase: 105 U/L (ref 25–125)
Bilirubin, Total: 0.3 mg/dL

## 2013-10-07 ENCOUNTER — Encounter: Payer: Self-pay | Admitting: *Deleted

## 2013-10-07 ENCOUNTER — Telehealth: Payer: Self-pay | Admitting: Internal Medicine

## 2013-10-07 NOTE — Telephone Encounter (Signed)
Labs normal.

## 2013-11-10 ENCOUNTER — Encounter: Payer: Self-pay | Admitting: Internal Medicine

## 2013-11-12 ENCOUNTER — Ambulatory Visit: Payer: Managed Care, Other (non HMO) | Admitting: Internal Medicine

## 2013-11-12 LAB — HM MAMMOGRAPHY: HM MAMMO: NEGATIVE

## 2013-11-16 ENCOUNTER — Encounter: Payer: Self-pay | Admitting: *Deleted

## 2013-11-23 ENCOUNTER — Encounter: Payer: Self-pay | Admitting: Internal Medicine

## 2013-12-27 ENCOUNTER — Other Ambulatory Visit: Payer: Self-pay | Admitting: *Deleted

## 2013-12-27 DIAGNOSIS — G4700 Insomnia, unspecified: Secondary | ICD-10-CM

## 2013-12-27 MED ORDER — ZOLPIDEM TARTRATE 10 MG PO TABS: 10.0000 mg | ORAL_TABLET | Freq: Every evening | ORAL | Status: DC | PRN

## 2013-12-27 NOTE — Telephone Encounter (Signed)
Ok refill? Last visit 09/17/13

## 2014-06-10 ENCOUNTER — Telehealth: Payer: Self-pay

## 2014-06-10 ENCOUNTER — Telehealth: Payer: Self-pay | Admitting: Internal Medicine

## 2014-06-10 NOTE — Telephone Encounter (Signed)
Pt stopped by office to pick up [facility] lab req. Pt was advised that per Dr. Tilman NeatWalker's notes that she wants to see patient prior to having labs done. Pt  Upset because she doesn't want to miss work to come for two appts. Pt stated to just cancel her appt and walked out.msn

## 2014-06-10 NOTE — Telephone Encounter (Signed)
The pt called and is hoping to have orders type up for upcoming appt.  She wants her iron, cholesterol, etc checked. (she gets her labs done at [facility])   Pt callback - [phone]

## 2014-06-10 NOTE — Telephone Encounter (Signed)
We need to see her in a visit first to determine what labs need ordered

## 2014-06-10 NOTE — Telephone Encounter (Signed)
Pt states that she has been feeling really bad over the last several weeks, asked for any labs that you feel would be needed.  Pt really would like labs prior to appt.

## 2014-06-13 ENCOUNTER — Encounter: Payer: Self-pay | Admitting: *Deleted

## 2014-06-13 NOTE — Telephone Encounter (Signed)
Please see below.

## 2014-06-13 NOTE — Telephone Encounter (Signed)
Spoke with pt, Pt states she still wants to come to her appt tomorrow at 10.  I put her back on the schedule

## 2014-06-13 NOTE — Telephone Encounter (Signed)
OK. I have filled out lab order for her.

## 2014-06-14 ENCOUNTER — Ambulatory Visit: Payer: Managed Care, Other (non HMO) | Admitting: Internal Medicine

## 2014-06-14 ENCOUNTER — Telehealth: Payer: Self-pay | Admitting: *Deleted

## 2014-07-13 ENCOUNTER — Ambulatory Visit (INDEPENDENT_AMBULATORY_CARE_PROVIDER_SITE_OTHER): Payer: Managed Care, Other (non HMO) | Admitting: Internal Medicine

## 2014-07-13 ENCOUNTER — Encounter: Payer: Self-pay | Admitting: Internal Medicine

## 2014-07-13 VITALS — BP 101/68 | HR 65 | Temp 97.7°F | Ht 64.5 in | Wt 184.4 lb

## 2014-07-13 DIAGNOSIS — F329 Major depressive disorder, single episode, unspecified: Secondary | ICD-10-CM | POA: Diagnosis not present

## 2014-07-13 DIAGNOSIS — G4700 Insomnia, unspecified: Secondary | ICD-10-CM

## 2014-07-13 DIAGNOSIS — R42 Dizziness and giddiness: Secondary | ICD-10-CM | POA: Diagnosis not present

## 2014-07-13 DIAGNOSIS — F32A Depression, unspecified: Secondary | ICD-10-CM

## 2014-07-13 MED ORDER — ZOLPIDEM TARTRATE 10 MG PO TABS: 10.0000 mg | ORAL_TABLET | Freq: Every evening | ORAL | Status: DC | PRN

## 2014-07-13 MED ORDER — SUMATRIPTAN SUCCINATE 6 MG/0.5ML ~~LOC~~ SOLN: 6.0000 mg | SUBCUTANEOUS | Status: DC | PRN

## 2014-07-13 NOTE — Progress Notes (Signed)
Subjective:    Patient ID: [name], female    DOB: 1955/05/25, 59 y.o.   MRN: [id number]  HPI  59YO female presents for acute visit.  Feeling lightheaded over last 3 weeks. Having some burning in legs and cramping. No NVD. BP seems to be running low. Feeling hungry, more than normal. Feels bloated at times. Has limited dietary intake.  Husband recently passed away. Tearful describing this. Plans to seek counseling through work.  Wt Readings from Last 3 Encounters:  07/13/14 184 lb 6 oz (83.632 kg)  09/17/13 186 lb 4 oz (84.482 kg)  08/04/13 192 lb 8 oz (87.317 kg)   Body mass index is 31.17 kg/(m^2).   BP Readings from Last 3 Encounters:  07/13/14 101/68  09/17/13 118/66  08/04/13 118/80    Past medical, surgical, family and social history per today's encounter.  Review of Systems  Constitutional: Positive for fatigue. Negative for fever, chills, appetite change and unexpected weight change.  Eyes: Negative for visual disturbance.  Respiratory: Negative for shortness of breath.   Cardiovascular: Negative for chest pain and leg swelling.  Gastrointestinal: Negative for nausea, vomiting, abdominal pain, diarrhea and constipation.  Musculoskeletal: Positive for myalgias. Negative for arthralgias.  Skin: Negative for color change and rash.  Hematological: Negative for adenopathy. Does not bruise/bleed easily.  Psychiatric/Behavioral: Positive for sleep disturbance and dysphoric mood. Negative for suicidal ideas. The patient is not nervous/anxious.        Objective:    BP 101/68 mmHg  Pulse 65  Temp(Src) 97.7 F (36.5 C) (Oral)  Ht 5' 4.5" (1.638 m)  Wt 184 lb 6 oz (83.632 kg)  BMI 31.17 kg/m2  SpO2 97% Physical Exam  Constitutional: She is oriented to person, place, and time. She appears well-developed and well-nourished. No distress.  HENT:  Head: Normocephalic and atraumatic.  Right Ear: External ear normal.  Left Ear: External ear normal.  Nose:  Nose normal.  Mouth/Throat: Oropharynx is clear and moist. No oropharyngeal exudate.  Eyes: Conjunctivae are normal. Pupils are equal, round, and reactive to light. Right eye exhibits no discharge. Left eye exhibits no discharge. No scleral icterus.  Neck: Normal range of motion. Neck supple. No tracheal deviation present. No thyromegaly present.  Cardiovascular: Normal rate, regular rhythm, normal heart sounds and intact distal pulses.  Exam reveals no gallop and no friction rub.   No murmur heard. Pulmonary/Chest: Effort normal and breath sounds normal. No respiratory distress. She has no wheezes. She has no rales. She exhibits no tenderness.  Musculoskeletal: Normal range of motion. She exhibits no edema or tenderness.  Lymphadenopathy:    She has no cervical adenopathy.  Neurological: She is alert and oriented to person, place, and time. No cranial nerve deficit. She exhibits normal muscle tone. Coordination normal.  Skin: Skin is warm and dry. No rash noted. She is not diaphoretic. No erythema. No pallor.  Psychiatric: Her speech is normal and behavior is normal. Judgment and thought content normal. Cognition and memory are normal. She exhibits a depressed mood. She expresses no suicidal ideation.          Assessment & Plan:   Problem List Items Addressed This Visit      Unprioritized   Depression    Encouraged her to seek counseling after recent death of her husband. Continue Cymbalta. May consider adding medication next visit.      Dizziness - Primary    Recent lightheadedness, fatigue. Will check CBC, B12, CMP, lipids, TSH with labs.  Suspect depression playing a role. Follow up in 1 week.          Return in about 1 week (around 07/20/2014) for Recheck.

## 2014-07-13 NOTE — Assessment & Plan Note (Signed)
Recent lightheadedness, fatigue. Will check CBC, B12, CMP, lipids, TSH with labs. Suspect depression playing a role. Follow up in 1 week.

## 2014-07-13 NOTE — Assessment & Plan Note (Signed)
Encouraged her to seek counseling after recent death of her husband. Continue Cymbalta. May consider adding medication next visit.

## 2014-07-13 NOTE — Patient Instructions (Signed)
Labs today

## 2014-07-13 NOTE — Addendum Note (Signed)
Addended by: Marchia Meiers on: 07/13/2014 08:44 AM   Modules accepted: Orders

## 2014-07-13 NOTE — Progress Notes (Signed)
Pre visit review using our clinic review tool, if applicable. No additional management support is needed unless otherwise documented below in the visit note. 

## 2014-07-14 ENCOUNTER — Telehealth: Payer: Self-pay | Admitting: Internal Medicine

## 2014-07-14 NOTE — Telephone Encounter (Signed)
Labs

## 2014-07-20 ENCOUNTER — Ambulatory Visit (INDEPENDENT_AMBULATORY_CARE_PROVIDER_SITE_OTHER): Payer: Managed Care, Other (non HMO) | Admitting: Internal Medicine

## 2014-07-20 ENCOUNTER — Encounter: Payer: Self-pay | Admitting: Internal Medicine

## 2014-07-20 VITALS — BP 104/68 | Temp 98.2°F | Ht 64.5 in | Wt 187.0 lb

## 2014-07-20 DIAGNOSIS — R5382 Chronic fatigue, unspecified: Secondary | ICD-10-CM

## 2014-07-20 DIAGNOSIS — G4700 Insomnia, unspecified: Secondary | ICD-10-CM | POA: Diagnosis not present

## 2014-07-20 DIAGNOSIS — M2550 Pain in unspecified joint: Secondary | ICD-10-CM | POA: Diagnosis not present

## 2014-07-20 DIAGNOSIS — I10 Essential (primary) hypertension: Secondary | ICD-10-CM

## 2014-07-20 MED ORDER — NAPROXEN 500 MG PO TABS: 500.0000 mg | ORAL_TABLET | Freq: Every day | ORAL | Status: DC | PRN

## 2014-07-20 MED ORDER — CYANOCOBALAMIN 1000 MCG/ML IJ SOLN
1000.0000 ug | Freq: Once | INTRAMUSCULAR | Status: AC
  Administered 2014-07-20: 1000 ug via INTRAMUSCULAR

## 2014-07-20 MED ORDER — NEBIVOLOL HCL 5 MG PO TABS: ORAL_TABLET | ORAL | Status: DC

## 2014-07-20 MED ORDER — ZOLPIDEM TARTRATE 10 MG PO TABS: 10.0000 mg | ORAL_TABLET | Freq: Every evening | ORAL | Status: DC | PRN

## 2014-07-20 NOTE — Assessment & Plan Note (Signed)
Chronic insomnia. Controlled with Ambien. Will continue.

## 2014-07-20 NOTE — Patient Instructions (Addendum)
Decrease Bystolic to 5mg  daily.  We will set up a sleep study.  Follow up in 4 weeks.

## 2014-07-20 NOTE — Assessment & Plan Note (Signed)
Chronic fatigue. Likely multifactorial with chronic insomnia, recent death of her spouse and depressed mood, mildly low B12 and hypotension. Will decrease Bystolic to 5mg  daily. Add B12 injections monthly. Set up sleep study to evaluate for sleep apnea. If no improvement over next few weeks, and sleep study negative, will consider ECHO.

## 2014-07-20 NOTE — Assessment & Plan Note (Signed)
BP Readings from Last 3 Encounters:  07/20/14 104/68  07/13/14 101/68  09/17/13 118/66   BP running on low side. Question if hypotension may be contributing to fatigue. Will decrease Bystolic to 5mg  daily. Follow up in 4 weeks.

## 2014-07-20 NOTE — Progress Notes (Signed)
Subjective:    Patient ID: [name], female    DOB: 03-19-1955, 59 y.o.   MRN: [id number]  HPI  59YO female presents for follow up.  Recently seen for dizziness and fatigue. Labs were normal except for mildly low Vit D.Continues to feel exhausted. Has contemplated quitting her job because she is so exhausted. Notes interrupted sleep and uses Ambien to help with sleep. Also recently lost her husband to ALS. However, does not think that depression causing fatigue. No focal symptoms of chest pain, dyspnea, change in bowel habits. No change in appetite. Has occasional numbness or pinprick sensation over her lower legs bilaterally. Questions if mildly low B12 is contributing.  Past medical, surgical, family and social history per today's encounter.  Review of Systems  Constitutional: Negative for fever, chills, appetite change, fatigue and unexpected weight change.  Eyes: Negative for visual disturbance.  Respiratory: Negative for shortness of breath.   Cardiovascular: Negative for chest pain and leg swelling.  Gastrointestinal: Negative for nausea, vomiting, abdominal pain, diarrhea and constipation.  Skin: Negative for color change and rash.  Neurological: Positive for numbness. Negative for weakness, light-headedness and headaches.  Hematological: Negative for adenopathy. Does not bruise/bleed easily.  Psychiatric/Behavioral: Positive for sleep disturbance and dysphoric mood. Negative for suicidal ideas. The patient is not nervous/anxious.        Objective:    BP 104/68 mmHg  Temp(Src) 98.2 F (36.8 C) (Oral)  Ht 5' 4.5" (1.638 m)  Wt 187 lb (84.823 kg)  BMI 31.61 kg/m2  SpO2 96% Physical Exam  Constitutional: She is oriented to person, place, and time. She appears well-developed and well-nourished. No distress.  HENT:  Head: Normocephalic and atraumatic.  Right Ear: External ear normal.  Left Ear: External ear normal.  Nose: Nose normal.  Mouth/Throat: Oropharynx  is clear and moist. No oropharyngeal exudate.  Eyes: Conjunctivae are normal. Pupils are equal, round, and reactive to light. Right eye exhibits no discharge. Left eye exhibits no discharge. No scleral icterus.  Neck: Normal range of motion. Neck supple. No tracheal deviation present. No thyromegaly present.  Cardiovascular: Normal rate, regular rhythm, normal heart sounds and intact distal pulses.  Exam reveals no gallop and no friction rub.   No murmur heard. Pulmonary/Chest: Effort normal and breath sounds normal. No respiratory distress. She has no wheezes. She has no rales. She exhibits no tenderness.  Musculoskeletal: Normal range of motion. She exhibits no edema or tenderness.  Lymphadenopathy:    She has no cervical adenopathy.  Neurological: She is alert and oriented to person, place, and time. No cranial nerve deficit. She exhibits normal muscle tone. Coordination normal.  Skin: Skin is warm and dry. No rash noted. She is not diaphoretic. No erythema. No pallor.  Psychiatric: She has a normal mood and affect. Her behavior is normal. Judgment and thought content normal.          Assessment & Plan:   Problem List Items Addressed This Visit      Unprioritized   Arthralgia   Relevant Medications   naproxen (NAPROSYN) 500 MG tablet   Chronic fatigue - Primary    Chronic fatigue. Likely multifactorial with chronic insomnia, recent death of her spouse and depressed mood, mildly low B12 and hypotension. Will decrease Bystolic to 5mg  daily. Add B12 injections monthly. Set up sleep study to evaluate for sleep apnea. If no improvement over next few weeks, and sleep study negative, will consider ECHO.      Relevant Medications  cyanocobalamin ((VITAMIN B-12)) injection 1,000 mcg (Completed)   Other Relevant Orders   Ambulatory referral to Sleep Studies   Hypertension    BP Readings from Last 3 Encounters:  07/20/14 104/68  07/13/14 101/68  09/17/13 118/66   BP running on low  side. Question if hypotension may be contributing to fatigue. Will decrease Bystolic to  daily. Follow up in 4 weeks.      Relevant Medications   nebivolol (BYSTOLIC) 5 MG tablet   Insomnia    Chronic insomnia. Controlled with Ambien. Will continue.      Relevant Medications   zolpidem (AMBIEN) 10 MG tablet       Return in about 4 weeks (around 08/17/2014) for Recheck.

## 2014-07-20 NOTE — Progress Notes (Signed)
Pre visit review using our clinic review tool, if applicable. No additional management support is needed unless otherwise documented below in the visit note. 

## 2014-08-09 ENCOUNTER — Telehealth: Payer: Self-pay | Admitting: Internal Medicine

## 2014-08-09 ENCOUNTER — Encounter: Payer: Self-pay | Admitting: *Deleted

## 2014-08-09 NOTE — Telephone Encounter (Signed)
MyChart message sent to pt

## 2014-08-09 NOTE — Telephone Encounter (Signed)
Recent sleep study showed severe sleep apnea. I would recommend that we start auto-titrating CPAP right away. We can send this Rx to a supplier of her choice.

## 2014-08-15 ENCOUNTER — Telehealth: Payer: Self-pay | Admitting: Internal Medicine

## 2014-08-15 NOTE — Telephone Encounter (Signed)
Raynelle FanningJulie, Can you set up evaluation with [facility]? Or the company you had been discussing with her?

## 2014-08-15 NOTE — Telephone Encounter (Signed)
Pt called [name], states she wants to proceed with Cpap machine as her sleep deprivation is getting worse.

## 2014-08-15 NOTE — Telephone Encounter (Signed)
The patient called back asking Raynelle FanningJulie to return her call in regards to sleep titration information.

## 2014-08-16 NOTE — Telephone Encounter (Signed)
Pt called again requesting a return call from ShevlinJulie in reference to Cpap.

## 2014-08-16 NOTE — Telephone Encounter (Signed)
Called pt, Pt had not heard from [facility] was wanting to follow up, when I called her back she had finally received a call from them

## 2014-08-17 ENCOUNTER — Ambulatory Visit (INDEPENDENT_AMBULATORY_CARE_PROVIDER_SITE_OTHER): Payer: Managed Care, Other (non HMO) | Admitting: *Deleted

## 2014-08-17 DIAGNOSIS — E538 Deficiency of other specified B group vitamins: Secondary | ICD-10-CM | POA: Diagnosis not present

## 2014-08-17 MED ORDER — CYANOCOBALAMIN 1000 MCG/ML IJ SOLN
1000.0000 ug | Freq: Once | INTRAMUSCULAR | Status: AC
  Administered 2014-08-17: 1000 ug via INTRAMUSCULAR

## 2014-09-16 ENCOUNTER — Other Ambulatory Visit: Payer: Self-pay | Admitting: Internal Medicine

## 2014-09-16 NOTE — Telephone Encounter (Signed)
OK to refill Cymbalta?

## 2014-09-20 ENCOUNTER — Ambulatory Visit: Payer: Managed Care, Other (non HMO)

## 2014-09-22 ENCOUNTER — Ambulatory Visit: Payer: Managed Care, Other (non HMO) | Admitting: Podiatry

## 2014-10-17 ENCOUNTER — Telehealth: Payer: Self-pay | Admitting: *Deleted

## 2014-10-17 NOTE — Telephone Encounter (Signed)
Fine to send

## 2014-10-17 NOTE — Telephone Encounter (Signed)
Pt called requesting a discontinue of order for CPap be sent to [facility].  Pt states she can not get used to the mask.  The phone number to Christoper Allegra is [phone].

## 2014-10-18 ENCOUNTER — Encounter: Payer: Self-pay | Admitting: *Deleted

## 2014-10-18 NOTE — Telephone Encounter (Signed)
Letter sent.

## 2014-10-31 ENCOUNTER — Ambulatory Visit (INDEPENDENT_AMBULATORY_CARE_PROVIDER_SITE_OTHER): Payer: Managed Care, Other (non HMO) | Admitting: Nurse Practitioner

## 2014-10-31 VITALS — BP 168/100 | HR 72 | Temp 98.7°F | Resp 16 | Ht 64.5 in | Wt 196.8 lb

## 2014-10-31 DIAGNOSIS — R42 Dizziness and giddiness: Secondary | ICD-10-CM

## 2014-10-31 MED ORDER — MECLIZINE HCL 12.5 MG PO TABS: 12.5000 mg | ORAL_TABLET | Freq: Three times a day (TID) | ORAL | Status: DC | PRN

## 2014-10-31 NOTE — Progress Notes (Signed)
Pre visit review using our clinic review tool, if applicable. No additional management support is needed unless otherwise documented below in the visit note. 

## 2014-10-31 NOTE — Patient Instructions (Signed)
Benign Positional Vertigo Vertigo means you feel like you or your surroundings are moving when they are not. Benign positional vertigo is the most common form of vertigo. Benign means that the cause of your condition is not serious. Benign positional vertigo is more common in older adults. CAUSES  Benign positional vertigo is the result of an upset in the labyrinth system. This is an area in the middle ear that helps control your balance. This may be caused by a viral infection, head injury, or repetitive motion. However, often no specific cause is found. SYMPTOMS  Symptoms of benign positional vertigo occur when you move your head or eyes in different directions. Some of the symptoms may include:  Loss of balance and falls.  Vomiting.  Blurred vision.  Dizziness.  Nausea.  Involuntary eye movements (nystagmus). DIAGNOSIS  Benign positional vertigo is usually diagnosed by physical exam. If the specific cause of your benign positional vertigo is unknown, your caregiver may perform imaging tests, such as magnetic resonance imaging (MRI) or computed tomography (CT). TREATMENT  Your caregiver may recommend movements or procedures to correct the benign positional vertigo. Medicines such as meclizine, benzodiazepines, and medicines for nausea may be used to treat your symptoms. In rare cases, if your symptoms are caused by certain conditions that affect the inner ear, you may need surgery. HOME CARE INSTRUCTIONS   Follow your caregiver's instructions.  Move slowly. Do not make sudden body or head movements.  Avoid driving.  Avoid operating heavy machinery.  Avoid performing any tasks that would be dangerous to you or others during a vertigo episode.  Drink enough fluids to keep your urine clear or pale yellow. SEEK IMMEDIATE MEDICAL CARE IF:   You develop problems with walking, weakness, numbness, or using your arms, hands, or legs.  You have difficulty speaking.  You develop  severe headaches.  Your nausea or vomiting continues or gets worse.  You develop visual changes.  Your family or friends notice any behavioral changes.  Your condition gets worse.  You have a fever.  You develop a stiff neck or sensitivity to light. MAKE SURE YOU:   Understand these instructions.  Will watch your condition.  Will get help right away if you are not doing well or get worse. Document Released: 10/22/2005 Document Revised: 04/08/2011 Document Reviewed: 10/04/2010 Exit[employer] Patient Information 2015 [employer]. This information is not intended to replace advice given to you by your health care provider. Make sure you discuss any questions you have with your health care provider.    

## 2014-10-31 NOTE — Progress Notes (Signed)
Patient ID: [name], female    DOB: [birth year]  Age: 59 y.o. MRN: [id number]  CC: Dizziness   HPI [name] presents for dizziness since Thursday.  1) Since Thursday last week Dizziness in the morning upon waking felt nausea with this.  Room spinning intermittently Looking up worsens the dizziness Not as bad today  Left ear feels "heat in it"  Has not taken BP medication in a few days  Dramamine and Bonine- not helpful   History [name] has a past medical history of Hypertension and Depression.   She has past surgical history that includes Abdominal hysterectomy; laparoscopy; and Breast reduction surgery.   Her family history includes Diabetes in her daughter.She reports that she has never smoked. She has never used smokeless tobacco. She reports that she drinks alcohol. She reports that she does not use illicit drugs.  Outpatient Prescriptions Prior to Visit  Medication Sig Dispense Refill  . DULoxetine (CYMBALTA) 60 MG capsule Take 1 capsule by mouth  daily 90 capsule 3  . losartan-hydrochlorothiazide (HYZAAR) 100-12.5 MG per tablet Take 1 tablet by mouth  daily 90 tablet 1  . naproxen (NAPROSYN) 500 MG tablet Take 1 tablet (500 mg total) by mouth daily as needed. 90 tablet 3  . nebivolol (BYSTOLIC) 5 MG tablet Take 1 tablet by mouth  daily 90 tablet 3  . SUMAtriptan (IMITREX) 100 MG tablet Take 1 tablet (100 mg total) by mouth daily as needed. 30 tablet 0  . SUMAtriptan (IMITREX) 6 MG/0.5ML SOLN injection Inject 0.5 mLs (6 mg total) into the skin every 2 (two) hours as needed. 1.5 mL 0  . zolpidem (AMBIEN) 10 MG tablet Take 1 tablet (10 mg total) by mouth at bedtime as needed. 90 tablet 1   No facility-administered medications prior to visit.    ROS Review of Systems  Constitutional: Negative for fever, chills, diaphoresis and fatigue.  Respiratory: Negative for chest tightness, shortness of breath and wheezing.   Cardiovascular: Negative for chest pain,  palpitations and leg swelling.  Gastrointestinal: Negative for nausea, vomiting and diarrhea.  Skin: Negative for rash.  Neurological: Positive for dizziness. Negative for weakness, numbness and headaches.  Psychiatric/Behavioral: The patient is not nervous/anxious.     Objective:  BP 168/100 mmHg  Pulse 72  Temp(Src) 98.7 F (37.1 C)  Resp 16  Ht 5' 4.5" (1.638 m)  Wt 196 lb 12.8 oz (89.268 kg)  BMI 33.27 kg/m2  SpO2 97% patient has not taken blood pressure medication in a few days.  Physical Exam  Constitutional: She is oriented to person, place, and time. She appears well-developed and well-nourished. No distress.  HENT:  Head: Normocephalic and atraumatic.  Right Ear: External ear normal.  Left Ear: External ear normal.  Eyes:  Negative for nystagmus  Cardiovascular: Normal rate, regular rhythm and normal heart sounds.  Exam reveals no gallop and no friction rub.   No murmur heard. Pulmonary/Chest: Effort normal and breath sounds normal. No respiratory distress. She has no wheezes. She has no rales. She exhibits no tenderness.  Neurological: She is alert and oriented to person, place, and time. No cranial nerve deficit. She exhibits normal muscle tone. Coordination normal.  Could not reproduce his symptoms with multiple position changes and negative Romberg Slight lightheadedness with lying flat on back that resolved after a few seconds  Skin: Skin is warm and dry. No rash noted. She is not diaphoretic.  Psychiatric: She has a normal mood and affect. Her behavior is normal.  Judgment and thought content normal.   Assessment & Plan:   [name] was seen today for dizziness.  Diagnoses and all orders for this visit:  Dizziness  Other orders -     meclizine (ANTIVERT) 12.5 MG tablet; Take 1 tablet (12.5 mg total) by mouth 3 (three) times daily as needed for dizziness.  I am having Ms. Beehler start on meclizine. I am also having her maintain her SUMAtriptan, SUMAtriptan,  nebivolol, zolpidem, naproxen, losartan-hydrochlorothiazide, and DULoxetine.  Meds ordered this encounter  Medications  . meclizine (ANTIVERT) 12.5 MG tablet    Sig: Take 1 tablet (12.5 mg total) by mouth 3 (three) times daily as needed for dizziness.    Dispense:  30 tablet    Refill:  0    Order Specific Question:  Supervising Provider    Answer:  Sherlene Shams [[id number]]     Follow-up: Return if symptoms worsen or fail to improve.

## 2014-11-02 ENCOUNTER — Encounter: Payer: Self-pay | Admitting: Nurse Practitioner

## 2014-11-02 NOTE — Assessment & Plan Note (Signed)
Patient has had dizziness in the past. Probable benign positional vertigo. Patient declined using meclizine at this time. Asked patient to return if symptoms change or if she would like a referral to ENT.

## 2015-01-13 LAB — HM MAMMOGRAPHY: HM MAMMO: NEGATIVE

## 2015-01-16 ENCOUNTER — Encounter: Payer: Self-pay | Admitting: Internal Medicine

## 2015-02-22 ENCOUNTER — Encounter: Payer: Self-pay | Admitting: Internal Medicine

## 2015-02-28 ENCOUNTER — Encounter: Payer: Self-pay | Admitting: Internal Medicine

## 2015-02-28 ENCOUNTER — Other Ambulatory Visit: Payer: Self-pay | Admitting: Internal Medicine

## 2015-02-28 ENCOUNTER — Ambulatory Visit (INDEPENDENT_AMBULATORY_CARE_PROVIDER_SITE_OTHER): Payer: Managed Care, Other (non HMO) | Admitting: Internal Medicine

## 2015-02-28 VITALS — BP 118/78 | HR 68 | Temp 97.5°F | Resp 14 | Ht 64.5 in | Wt 196.0 lb

## 2015-02-28 DIAGNOSIS — E669 Obesity, unspecified: Secondary | ICD-10-CM

## 2015-02-28 DIAGNOSIS — G4700 Insomnia, unspecified: Secondary | ICD-10-CM

## 2015-02-28 DIAGNOSIS — I10 Essential (primary) hypertension: Secondary | ICD-10-CM | POA: Diagnosis not present

## 2015-02-28 DIAGNOSIS — R5382 Chronic fatigue, unspecified: Secondary | ICD-10-CM | POA: Diagnosis not present

## 2015-02-28 DIAGNOSIS — G43809 Other migraine, not intractable, without status migrainosus: Secondary | ICD-10-CM

## 2015-02-28 DIAGNOSIS — G4733 Obstructive sleep apnea (adult) (pediatric): Secondary | ICD-10-CM | POA: Insufficient documentation

## 2015-02-28 DIAGNOSIS — G4730 Sleep apnea, unspecified: Secondary | ICD-10-CM | POA: Insufficient documentation

## 2015-02-28 MED ORDER — LOSARTAN POTASSIUM-HCTZ 100-12.5 MG PO TABS: ORAL_TABLET | ORAL | Status: DC

## 2015-02-28 MED ORDER — SUMATRIPTAN SUCCINATE 100 MG PO TABS: 100.0000 mg | ORAL_TABLET | Freq: Every day | ORAL | Status: DC | PRN

## 2015-02-28 MED ORDER — ESZOPICLONE 2 MG PO TABS: 2.0000 mg | ORAL_TABLET | Freq: Every evening | ORAL | Status: DC | PRN

## 2015-02-28 NOTE — Assessment & Plan Note (Signed)
No recent migraine headaches. Will continue prn Imitrex.

## 2015-02-28 NOTE — Progress Notes (Signed)
Subjective:    Patient ID: [name], female    DOB: 1955/06/02, 60 y.o.   MRN: [id number]  HPI  60YO female presents for follow up.  Feeling more tired recently over last month. Nodding off at work. At night, goes to bed around 9:30pm or 10:30pm.  Wakes off and on throughout night. Tried using CPAP, but could not tolerate. Sleep study showed severe sleep apnea. No focal symptoms noted. Not exercising. Sedentary job. Trying to follow a healthy diet.    Wt Readings from Last 3 Encounters:  02/28/15 196 lb (88.905 kg)  10/31/14 196 lb 12.8 oz (89.268 kg)  07/20/14 187 lb (84.823 kg)   BP Readings from Last 3 Encounters:  02/28/15 118/78  10/31/14 168/100  07/20/14 104/68    Past Medical History  Diagnosis Date  . Hypertension   . Depression    Family History  Problem Relation Age of Onset  . Diabetes Daughter    Past Surgical History  Procedure Laterality Date  . Abdominal hysterectomy      for Menorrhagia  . Laparoscopy    . Breast reduction surgery     Social History   Social History  . Marital Status: Married    Spouse Name: N/A  . Number of Children: N/A  . Years of Education: N/A   Social History Main Topics  . Smoking status: Never Smoker   . Smokeless tobacco: Never Used  . Alcohol Use: Yes     Comment: wine 2x a week  . Drug Use: No  . Sexual Activity: Not Asked   Other Topics Concern  . None   Social History Narrative    Review of Systems  Constitutional: Positive for fatigue. Negative for fever, chills, appetite change and unexpected weight change.  Eyes: Negative for visual disturbance.  Respiratory: Negative for cough and shortness of breath.   Cardiovascular: Negative for chest pain, palpitations and leg swelling.  Gastrointestinal: Negative for nausea, vomiting, abdominal pain, diarrhea and constipation.  Musculoskeletal: Positive for arthralgias. Negative for myalgias.  Skin: Negative for color change and rash.    Hematological: Negative for adenopathy. Does not bruise/bleed easily.  Psychiatric/Behavioral: Positive for sleep disturbance. Negative for dysphoric mood. The patient is not nervous/anxious.        Objective:    BP 118/78 mmHg  Pulse 68  Temp(Src) 97.5 F (36.4 C) (Oral)  Resp 14  Ht 5' 4.5" (1.638 m)  Wt 196 lb (88.905 kg)  BMI 33.14 kg/m2  SpO2 95% Physical Exam  Constitutional: She is oriented to person, place, and time. She appears well-developed and well-nourished. No distress.  HENT:  Head: Normocephalic and atraumatic.  Right Ear: External ear normal.  Left Ear: External ear normal.  Nose: Nose normal.  Mouth/Throat: Oropharynx is clear and moist. No oropharyngeal exudate.  Eyes: Conjunctivae are normal. Pupils are equal, round, and reactive to light. Right eye exhibits no discharge. Left eye exhibits no discharge. No scleral icterus.  Neck: Normal range of motion. Neck supple. No tracheal deviation present. No thyromegaly present.  Cardiovascular: Normal rate, regular rhythm, normal heart sounds and intact distal pulses.  Exam reveals no gallop and no friction rub.   No murmur heard. Pulmonary/Chest: Effort normal and breath sounds normal. No respiratory distress. She has no wheezes. She has no rales. She exhibits no tenderness.  Musculoskeletal: Normal range of motion. She exhibits no edema or tenderness.  Lymphadenopathy:    She has no cervical adenopathy.  Neurological: She is alert and  oriented to person, place, and time. No cranial nerve deficit. She exhibits normal muscle tone. Coordination normal.  Skin: Skin is warm and dry. No rash noted. She is not diaphoretic. No erythema. No pallor.  Psychiatric: She has a normal mood and affect. Her behavior is normal. Judgment and thought content normal.          Assessment & Plan:   Problem List Items Addressed This Visit      Unprioritized   Chronic fatigue - Primary    Chronic fatigue, likely related to  disrupted sleep and untreated sleep apnea. Encouraged her to consider restarting CPAP, she declines. Will check labs including CBC, CMP, TSH. Follow up in 4 weeks.      Hypertension    BP Readings from Last 3 Encounters:  02/28/15 118/78  10/31/14 168/100  07/20/14 104/68   BP well controlled. Renal function with labs.      Relevant Medications   losartan-hydrochlorothiazide (HYZAAR) 100-12.5 MG tablet   Insomnia    Chronic insomnia. Encouraged her to increase activity during the day. Will try changing from Ambien to [facility]. Discussed risks of these medications. Follow up 4 weeks.      Migraines    No recent migraine headaches. Will continue prn Imitrex.      Relevant Medications   SUMAtriptan (IMITREX) 100 MG tablet   losartan-hydrochlorothiazide (HYZAAR) 100-12.5 MG tablet   Obesity    Encouraged healthy diet and exercise.      Sleep apnea    Encouraged her to consider another trial of CPAP, given severity of OSA and risks of untreated sleep apnea. She declines.          Return in about 4 weeks (around 03/28/2015) for Recheck.

## 2015-02-28 NOTE — Assessment & Plan Note (Signed)
Chronic insomnia. Encouraged her to increase activity during the day. Will try changing from Ambien to [facility]. Discussed risks of these medications. Follow up 4 weeks.

## 2015-02-28 NOTE — Assessment & Plan Note (Signed)
Encouraged her to consider another trial of CPAP, given severity of OSA and risks of untreated sleep apnea. She declines.

## 2015-02-28 NOTE — Assessment & Plan Note (Signed)
Chronic fatigue, likely related to disrupted sleep and untreated sleep apnea. Encouraged her to consider restarting CPAP, she declines. Will check labs including CBC, CMP, TSH. Follow up in 4 weeks.

## 2015-02-28 NOTE — Patient Instructions (Addendum)
Always Hungry by Cala Bradford Lunesta  at bedtime to help with sleep.  Labs today.

## 2015-02-28 NOTE — Assessment & Plan Note (Signed)
BP Readings from Last 3 Encounters:  02/28/15 118/78  10/31/14 168/100  07/20/14 104/68   BP well controlled. Renal function with labs.

## 2015-02-28 NOTE — Assessment & Plan Note (Signed)
Encouraged healthy diet and exercise

## 2015-03-01 LAB — LIPID PANEL W/O CHOL/HDL RATIO
CHOLESTEROL TOTAL: 173 mg/dL (ref 100–199)
HDL: 42 mg/dL (ref >39)
LDL CALC: 97 mg/dL (ref 0–99)
TRIGLYCERIDES: 172 mg/dL — AB (ref 0–149)
VLDL CHOLESTEROL CAL: 34 mg/dL (ref 5–40)

## 2015-03-01 LAB — COMPREHENSIVE METABOLIC PANEL
ALBUMIN: 4.7 g/dL (ref 3.5–5.5)
ALK PHOS: 98 IU/L (ref 39–117)
ALT: 29 IU/L (ref 0–32)
AST: 27 IU/L (ref 0–40)
Albumin/Globulin Ratio: 2.1 (ref 1.1–2.5)
BILIRUBIN TOTAL: 0.3 mg/dL (ref 0.0–1.2)
BUN / CREAT RATIO: 17 (ref 9–23)
BUN: 11 mg/dL (ref 6–24)
CHLORIDE: 99 mmol/L (ref 96–106)
CO2: 24 mmol/L (ref 18–29)
CREATININE: 0.64 mg/dL (ref 0.57–1.00)
Calcium: 9.5 mg/dL (ref 8.7–10.2)
GFR calc Af Amer: 114 mL/min/{1.73_m2} (ref >59)
GFR calc non Af Amer: 99 mL/min/{1.73_m2} (ref >59)
GLUCOSE: 94 mg/dL (ref 65–99)
Globulin, Total: 2.2 g/dL (ref 1.5–4.5)
Potassium: 4.3 mmol/L (ref 3.5–5.2)
SODIUM: 139 mmol/L (ref 134–144)
Total Protein: 6.9 g/dL (ref 6.0–8.5)

## 2015-03-01 LAB — CBC WITH DIFFERENTIAL/PLATELET
BASOS ABS: 0 10*3/uL (ref 0.0–0.2)
Basos: 1 %
EOS (ABSOLUTE): 0.2 10*3/uL (ref 0.0–0.4)
EOS: 3 %
HEMATOCRIT: 39.9 % (ref 34.0–46.6)
Hemoglobin: 13.1 g/dL (ref 11.1–15.9)
IMMATURE GRANULOCYTES: 0 %
Immature Grans (Abs): 0 10*3/uL (ref 0.0–0.1)
LYMPHS ABS: 1.9 10*3/uL (ref 0.7–3.1)
Lymphs: 31 %
MCH: 30.5 pg (ref 26.6–33.0)
MCHC: 32.8 g/dL (ref 31.5–35.7)
MCV: 93 fL (ref 79–97)
MONOCYTES: 8 %
MONOS ABS: 0.5 10*3/uL (ref 0.1–0.9)
NEUTROS PCT: 57 %
Neutrophils Absolute: 3.6 10*3/uL (ref 1.4–7.0)
Platelets: 307 10*3/uL (ref 150–379)
RBC: 4.3 x10E6/uL (ref 3.77–5.28)
RDW: 13 % (ref 12.3–15.4)
WBC: 6.3 10*3/uL (ref 3.4–10.8)

## 2015-03-01 LAB — T4: T4 TOTAL: 9 ug/dL (ref 4.5–12.0)

## 2015-03-01 LAB — B12 AND FOLATE PANEL
Folate: 19.7 ng/mL (ref >3.0)
Vitamin B-12: 714 pg/mL (ref 211–946)

## 2015-03-01 LAB — HGB A1C W/O EAG: HEMOGLOBIN A1C: 5.9 % — AB (ref 4.8–5.6)

## 2015-03-01 LAB — FERRITIN: FERRITIN: 32 ng/mL (ref 15–150)

## 2015-03-01 LAB — VITAMIN D 25 HYDROXY (VIT D DEFICIENCY, FRACTURES): Vit D, 25-Hydroxy: 32.7 ng/mL (ref 30.0–100.0)

## 2015-03-01 LAB — TSH: TSH: 1.76 u[IU]/mL (ref 0.450–4.500)

## 2015-04-03 ENCOUNTER — Telehealth: Payer: Self-pay | Admitting: *Deleted

## 2015-04-03 DIAGNOSIS — G4700 Insomnia, unspecified: Secondary | ICD-10-CM

## 2015-04-03 NOTE — Telephone Encounter (Signed)
Patient would like to be referred to a sleep specialist. . Patient stated that she was not taking her sleep medications, due to side effects.

## 2015-04-03 NOTE — Telephone Encounter (Signed)
Pt states that she has been having insomnia, she has been taking Mal[place]ambien and lunesta and still having a hard time get a goodnight sleep. Pt is requesting a referral for a sleep specialist. Please advise, thanks

## 2015-04-03 NOTE — Telephone Encounter (Signed)
Notified pt. 

## 2015-04-03 NOTE — Telephone Encounter (Signed)
Referral order placed.

## 2015-04-05 ENCOUNTER — Ambulatory Visit: Payer: Managed Care, Other (non HMO) | Admitting: Internal Medicine

## 2015-04-13 ENCOUNTER — Other Ambulatory Visit: Payer: Self-pay

## 2015-04-13 MED ORDER — ZOLPIDEM TARTRATE 10 MG PO TABS: 10.0000 mg | ORAL_TABLET | Freq: Every evening | ORAL | Status: DC | PRN

## 2015-04-13 NOTE — Telephone Encounter (Signed)
Pt's pharmacy is requesting a refill on her zolpidem (AMBIEN) 10 MG . Pt's last OV 02/28/15, Please advise, thanks

## 2015-05-01 ENCOUNTER — Telehealth: Payer: Self-pay | Admitting: Internal Medicine

## 2015-05-01 NOTE — Telephone Encounter (Signed)
Patient is coming in as a new patient but isn't sure she should be seen as she has already had a sleep study and is struggling to keep her mask on at night. The sleep company has not been helpful at all.  Please call patient.

## 2015-05-01 NOTE — Telephone Encounter (Signed)
LMOM for pt to return call. 

## 2015-05-02 NOTE — Telephone Encounter (Signed)
Pt will keep appt. Will discuss issues at appt. Nothing further needed.

## 2015-05-29 ENCOUNTER — Institutional Professional Consult (permissible substitution): Payer: Managed Care, Other (non HMO) | Admitting: Internal Medicine

## 2015-05-30 ENCOUNTER — Ambulatory Visit (INDEPENDENT_AMBULATORY_CARE_PROVIDER_SITE_OTHER): Payer: Managed Care, Other (non HMO) | Admitting: Internal Medicine

## 2015-05-30 ENCOUNTER — Encounter: Payer: Self-pay | Admitting: Internal Medicine

## 2015-05-30 VITALS — BP 148/82 | HR 77 | Ht 64.5 in | Wt 199.8 lb

## 2015-05-30 DIAGNOSIS — G4733 Obstructive sleep apnea (adult) (pediatric): Secondary | ICD-10-CM | POA: Diagnosis not present

## 2015-05-30 NOTE — Addendum Note (Signed)
Addended by: Maxwell MarionBLANKENSHIP, MARGIE A on: 05/30/2015 05:10 PM   Modules accepted: Orders

## 2015-05-30 NOTE — Progress Notes (Signed)
[facility] Consultation      Assessment and Plan:  Obstructive sleep apnea. --Could not tolerate mask or pressure. Will send for PAP-NAP.  --Will start on Auto-CPAP at low pressure to help her tolerate the CPAP.  --If this plan does not work will discuss referral for dental device.   Excessive daytime sleepiness. --This may be multifactorial from OSA and from inadequate sleep due to anxiety and insomnia.   Essential hypertension. --OSA may contribute to hypertension, therefore it would be important to treat it.   Insomnia.  --Sleep maintenance type, discussed change ambien to CR-6.25 mg, however she just had her ambien refilled for 90 days. Therefore will hold off on this until other testing is done.    Date: 05/30/2015  MRN# [id number] [name] 01-25-1956  Referring Physician: Dr Dan Humphreys.   [name] is a 60 y.o. old female seen in consultation for chief complaint of:    Chief Complaint  Patient presents with  . sleep consult    pt ref by dr. walker. pt states she had sleep study around 12/2014 positive for osa unable to wear CPAP due to taking mask off after 2 hr. c/o loud snoring, daytime sleepiness, trouble staying alseep. EPWORTH: 1    HPI:   The patient is a 60 year old female with a history of insomnia, essential hypertension, obesity, obstructive sleep apnea. She has been diagnosed with obstructive sleep apnea in the past, however, she could not tolerate CPAP. She is referred here because she is having increasing symptoms of excessive daytime sleepiness during the day.  Currently the patient notes that she wakes up constantly during the night, she typically goes to bed between 10 and 10:30 PM. She gets out of bed at 5:20 AM. She was diagnosed with OSA last year, she was given a CPAP, she was actually taking off the mask and turning off the machine. She tried it for 3 weeks and then sent it back. She noted a lot of difficulty falling  asleep with it, she really disliked the mask and she did not have much support from homecare in terms of trouble shooting.   She has been taking ambien for at least 10 years, she takes half of a 10 mg pill every night. She has tried to stop, but is not able to sleep without it. She is now sleepy during the day. Her ESS is 1; but feels that she could fall asleep easily at work but never does. However she is unable to make herself go to sleep when she wants to. She wakes up frequently during the night. She has never been on ambien CR. She has been on lunesta in the past but could not tolerate the taste.   Review of tracings from home sleep study 08/15/2014: 39 sleep study showed AHI of 18, or 29 per AASM criteria.   PMHX:   Past Medical History  Diagnosis Date  . Hypertension   . Depression    Surgical Hx:  Past Surgical History  Procedure Laterality Date  . Abdominal hysterectomy      for Menorrhagia  . Laparoscopy    . Breast reduction surgery     Family Hx:  Family History  Problem Relation Age of Onset  . Diabetes Daughter    Social Hx:   Social History  Substance Use Topics  . Smoking status: Never Smoker   . Smokeless tobacco: Never Used  . Alcohol Use: Yes     Comment: wine 2x a  week   Medication:   Current Outpatient Rx  Name  Route  Sig  Dispense  Refill  . DULoxetine (CYMBALTA) 60 MG capsule      Take 1 capsule by mouth  daily   90 capsule   3   . eszopiclone (LUNESTA) 2 MG TABS tablet   Oral   Take 1 tablet (2 mg total) by mouth at bedtime as needed for sleep. Take immediately before bedtime   30 tablet   3   . losartan-hydrochlorothiazide (HYZAAR) 100-12.5 MG tablet      Take 1 tablet by mouth  daily   90 tablet   3   . meclizine (ANTIVERT) 12.5 MG tablet   Oral   Take 1 tablet (12.5 mg total) by mouth 3 (three) times daily as needed for dizziness. Patient not taking: Reported on 02/28/2015   30 tablet   0   . naproxen (NAPROSYN) 500 MG tablet    Oral   Take 1 tablet (500 mg total) by mouth daily as needed.   90 tablet   3   . nebivolol (BYSTOLIC) 5 MG tablet      Take 1 tablet by mouth  daily   90 tablet   3   . SUMAtriptan (IMITREX) 100 MG tablet   Oral   Take 1 tablet (100 mg total) by mouth daily as needed.   30 tablet   11   . SUMAtriptan (IMITREX) 6 MG/0.5ML SOLN injection   Subcutaneous   Inject 0.5 mLs (6 mg total) into the skin every 2 (two) hours as needed.   1.5 mL   0     Please only fill meds when pt calls   . zolpidem (AMBIEN) 10 MG tablet   Oral   Take 1 tablet (10 mg total) by mouth at bedtime as needed for sleep.   30 tablet   3       Allergies:  Review of patient's allergies indicates no known allergies.  Review of Systems: Gen:  Denies  fever, sweats, chills HEENT: Denies blurred vision, double vision. bleeds, sore throat Cvc:  No dizziness, chest pain. Resp:   Denies cough or sputum production, shortness of breath Gi: Denies swallowing difficulty, stomach pain. Gu:  Denies bladder incontinence, burning urine Ext:   No Joint pain, stiffness. Skin: No skin rash,  hives  Endoc:  No polyuria, polydipsia. Psych: No depression, insomnia. Other:  All other systems were reviewed with the patient and were negative other that what is mentioned in the HPI.   Physical Examination:   VS: BP 148/82 mmHg  Pulse 77  Ht 5' 4.5" (1.638 m)  Wt 199 lb 12.8 oz (90.629 kg)  BMI 33.78 kg/m2  SpO2 97%  General Appearance: No distress  Neuro:without focal findings,  speech normal,  HEENT: PERRLA, EOM intact.  [place].  Pulmonary: normal breath sounds, No wheezing.  CardiovascularNormal S1,S2.  No m/r/g.   Abdomen: Benign, Soft, non-tender. Renal:  No costovertebral tenderness  GU:  No performed at this time. Endoc: No evident thyromegaly, no signs of acromegaly. Skin:   warm, no rashes, no ecchymosis  Extremities: normal, no cyanosis, clubbing.  Other findings:    LABORATORY PANEL:    CBC No results for input(s): WBC, HGB, HCT, PLT in the last 168 hours. ------------------------------------------------------------------------------------------------------------------  Chemistries  No results for input(s): NA, K, CL, CO2, GLUCOSE, BUN, CREATININE, CALCIUM, MG, AST, ALT, ALKPHOS, BILITOT in the last 168 hours.  Invalid input(s): GFRCGP ------------------------------------------------------------------------------------------------------------------  Cardiac Enzymes  No results for input(s): TROPONINI in the last 168 hours. ------------------------------------------------------------  RADIOLOGY:  No results found.     Thank  you for the consultation and for allowing [facility] to assist in the care of your patient. Our recommendations are noted above.  Please contact us if we can be of further service.   Wells Guileseep Amaiah Cristiano, MD.  Board Certified in Internal Medicine, Pulmonary Medicine, Critical Care Medicine, and Sleep Medicine.  [facility] Office Number: [phone]  Santiago Gladavid Kasa, M.D.  Stephanie AcreVishal Mungal, M.D.  Billy Fischeravid Simonds, M.D  05/30/2015

## 2015-05-30 NOTE — Patient Instructions (Addendum)
--  CPAP desensitization.   --Auto-PAP 5-8.

## 2015-05-31 ENCOUNTER — Telehealth: Payer: Self-pay | Admitting: Internal Medicine

## 2015-05-31 NOTE — Telephone Encounter (Signed)
Okey Regalarol with [facility] called and stated that pt no longer has her cpap and therefore unable to address referral to change CPAP pressure.  Pt's cpap was p/u by [facility] on 10/21/14 due to non compliance.  Pt will need to have another Sleep Study, can be HST to restart process on obtaining another CPAP.   Please advise.  Rhonda J Cobb

## 2015-06-01 NOTE — Telephone Encounter (Signed)
Pt calling stating that she would not like to use [facility] for her CPAP machine. States they have "horriable" customer service. Would like another company to do this with her.  Please take into consideration   If we need to call patient today she is at work [phone]

## 2015-06-01 NOTE — Telephone Encounter (Signed)
Called and spoke with patient and advised patient that with her insurance, [employer], is the in network provider. Pt stated that she turned the machine in due to poor customer service with [facility].  She stated that she contacted them many times to have someone address the issue with her mask and finally when no one from [facility] would address this issue, she took the machine up to [facility] and turned it in herself. Pt does not have a cpap at this time and by her turning the machine back in, I believe that she will need to repeat HST again to restart CPAP process.  I will contact the insurance and address this issue. Pt will contact me back once she gets off work. Rhonda J Cobb

## 2015-06-02 ENCOUNTER — Institutional Professional Consult (permissible substitution): Payer: Managed Care, Other (non HMO) | Admitting: Internal Medicine

## 2015-06-02 NOTE — Telephone Encounter (Signed)
Called [employer] and spoke with Maralyn SagoSarah and advised that I needed to know if there was [employer] company that was in network with [employer] that patient could use besides [facility].  Per Maralyn SagoSarah at [employer], [facility] is in network.  Pt returned my call this morning as was informed that [facility] could service her and it would be in network. Pt stated that she was fine with order being sent there. Advised patient that I would fax referral and sleep study to [facility] and she should be contacted by them within 7-10 days.  Pt voiced understanding and thanked me for my time and effort. Nothing else is needed at this time. Rhonda J Cobb

## 2015-07-18 ENCOUNTER — Other Ambulatory Visit: Payer: Self-pay | Admitting: Internal Medicine

## 2015-08-04 ENCOUNTER — Ambulatory Visit: Payer: Managed Care, Other (non HMO) | Admitting: Internal Medicine

## 2015-08-16 ENCOUNTER — Ambulatory Visit (INDEPENDENT_AMBULATORY_CARE_PROVIDER_SITE_OTHER): Payer: Managed Care, Other (non HMO) | Admitting: Internal Medicine

## 2015-08-16 ENCOUNTER — Encounter: Payer: Self-pay | Admitting: Internal Medicine

## 2015-08-16 VITALS — BP 134/80 | HR 76 | Ht 65.0 in | Wt 203.4 lb

## 2015-08-16 DIAGNOSIS — I10 Essential (primary) hypertension: Secondary | ICD-10-CM | POA: Diagnosis not present

## 2015-08-16 DIAGNOSIS — E669 Obesity, unspecified: Secondary | ICD-10-CM | POA: Diagnosis not present

## 2015-08-16 DIAGNOSIS — G4730 Sleep apnea, unspecified: Secondary | ICD-10-CM

## 2015-08-16 DIAGNOSIS — G4700 Insomnia, unspecified: Secondary | ICD-10-CM

## 2015-08-16 DIAGNOSIS — F329 Major depressive disorder, single episode, unspecified: Secondary | ICD-10-CM

## 2015-08-16 DIAGNOSIS — F32A Depression, unspecified: Secondary | ICD-10-CM

## 2015-08-16 MED ORDER — DULOXETINE HCL 60 MG PO CPEP: ORAL_CAPSULE | ORAL | Status: DC

## 2015-08-16 MED ORDER — NEBIVOLOL HCL 5 MG PO TABS: ORAL_TABLET | ORAL | Status: DC

## 2015-08-16 MED ORDER — LOSARTAN POTASSIUM-HCTZ 100-12.5 MG PO TABS
ORAL_TABLET | ORAL | Status: DC
Start: 2015-08-16 — End: 2016-07-02

## 2015-08-16 MED ORDER — LIRAGLUTIDE -WEIGHT MANAGEMENT 18 MG/3ML ~~LOC~~ SOPN: 0.6000 mg | PEN_INJECTOR | Freq: Every day | SUBCUTANEOUS | Status: DC

## 2015-08-16 MED ORDER — ZOLPIDEM TARTRATE 10 MG PO TABS: 10.0000 mg | ORAL_TABLET | Freq: Every evening | ORAL | Status: DC | PRN

## 2015-08-16 MED ORDER — SUMATRIPTAN SUCCINATE 6 MG/0.5ML ~~LOC~~ SOLN: 6.0000 mg | SUBCUTANEOUS | Status: DC | PRN

## 2015-08-16 MED ORDER — BUPROPION HCL ER (XL) 150 MG PO TB24: 150.0000 mg | ORAL_TABLET | Freq: Every day | ORAL | Status: DC

## 2015-08-16 NOTE — Assessment & Plan Note (Signed)
Wt Readings from Last 3 Encounters:  08/16/15 203 lb 6.4 oz (92.262 kg)  05/30/15 199 lb 12.8 oz (90.629 kg)  02/28/15 196 lb (88.905 kg)   Body mass index is 33.85 kg/(m^2). Encouraged her to limit carbohydrates in her diet. Encouraged physical activity. Discussed several options to help with appetite. Will start Saxenda 0.6mg  daily. Discussed potential risks of this medication. Follow up recheck in 2 weeks.

## 2015-08-16 NOTE — Progress Notes (Signed)
Pre visit review using our clinic review tool, if applicable. No additional management support is needed unless otherwise documented below in the visit note. 

## 2015-08-16 NOTE — Patient Instructions (Addendum)
Go to website:  [email or web address].[email or web address]  To get [facility]  Start Saxenda 0.6mg  daily.  Try limiting dose of Ambien by skipping some nights.  Start Wellbutrin 150mg  in the morning.  Follow up in 2 weeks.

## 2015-08-16 NOTE — Assessment & Plan Note (Signed)
Worsening depression. Will add Wellbutrin 150mg  daily. Continue Cymbalta. Encouraged her to take time for herself.

## 2015-08-16 NOTE — Assessment & Plan Note (Signed)
Encouraged her to limit dose of Ambien. Discussed that Ambien is considered obesigenic.

## 2015-08-16 NOTE — Assessment & Plan Note (Signed)
BP Readings from Last 3 Encounters:  08/16/15 134/80  05/30/15 148/82  02/28/15 118/78   BP well controlled. Renal function recently at [facility] normal. Will continue current medications.

## 2015-08-16 NOTE — Assessment & Plan Note (Signed)
Not tolerating CPAP well. Has follow up with sleep specialist. Encouraged her to keep this follow up as perhaps a new mask might be helpful.

## 2015-08-16 NOTE — Progress Notes (Signed)
Subjective:    Patient ID: [name], female    DOB: 01/02/1956, 60 y.o.   MRN: [id number]  HPI  60YO female presents for follow up.  Obesity - concerned about weight gain. Has gained over 20lb since husband died. Eating all day. Eats when not hungry. Tries to avoid sugar. Not exercising.  Depression - More emotional lately. Taking Cymbalta with minimal improvement. Tearful at times. Strong support from family, however work is stressful.  Has trouble sleeping with CPAP. Wakes frequently during night. Wearing CPAP, but often takes off the equipment. Taking Ambien with minimal improvement.  Wt Readings from Last 3 Encounters:  08/16/15 203 lb 6.4 oz (92.262 kg)  05/30/15 199 lb 12.8 oz (90.629 kg)  02/28/15 196 lb (88.905 kg)   BP Readings from Last 3 Encounters:  08/16/15 134/80  05/30/15 148/82  02/28/15 118/78    Past Medical History  Diagnosis Date  . Hypertension   . Depression    Family History  Problem Relation Age of Onset  . Diabetes Daughter    Past Surgical History  Procedure Laterality Date  . Abdominal hysterectomy      for Menorrhagia  . Laparoscopy    . Breast reduction surgery     Social History   Social History  . Marital Status: Married    Spouse Name: N/A  . Number of Children: N/A  . Years of Education: N/A   Social History Main Topics  . Smoking status: Never Smoker   . Smokeless tobacco: Never Used  . Alcohol Use: Yes     Comment: wine 2x a week  . Drug Use: No  . Sexual Activity: Not Asked   Other Topics Concern  . None   Social History Narrative    Review of Systems  Constitutional: Negative for fever, chills, appetite change, fatigue and unexpected weight change.  Eyes: Negative for visual disturbance.  Respiratory: Negative for cough and shortness of breath.   Cardiovascular: Negative for chest pain and leg swelling.  Gastrointestinal: Negative for nausea, vomiting, abdominal pain, diarrhea and constipation.    Musculoskeletal: Negative for myalgias and arthralgias.  Skin: Negative for color change and rash.  Hematological: Negative for adenopathy. Does not bruise/bleed easily.  Psychiatric/Behavioral: Positive for sleep disturbance and dysphoric mood. Negative for suicidal ideas. The patient is not nervous/anxious.        Objective:    BP 134/80 mmHg  Pulse 76  Ht  (1.651 m)  Wt 203 lb 6.4 oz (92.262 kg)  BMI 33.85 kg/m2  SpO2 97% Physical Exam  Constitutional: She is oriented to person, place, and time. She appears well-developed and well-nourished. No distress.  HENT:  Head: Normocephalic and atraumatic.  Right Ear: External ear normal.  Left Ear: External ear normal.  Nose: Nose normal.  Mouth/Throat: Oropharynx is clear and moist. No oropharyngeal exudate.  Eyes: Conjunctivae are normal. Pupils are equal, round, and reactive to light. Right eye exhibits no discharge. Left eye exhibits no discharge. No scleral icterus.  Neck: Normal range of motion. Neck supple. No tracheal deviation present. No thyromegaly present.  Cardiovascular: Normal rate, regular rhythm, normal heart sounds and intact distal pulses.  Exam reveals no gallop and no friction rub.   No murmur heard. Pulmonary/Chest: Effort normal and breath sounds normal. No respiratory distress. She has no wheezes. She has no rales. She exhibits no tenderness.  Musculoskeletal: Normal range of motion. She exhibits no edema or tenderness.  Lymphadenopathy:    She has no  cervical adenopathy.  Neurological: She is alert and oriented to person, place, and time. No cranial nerve deficit. She exhibits normal muscle tone. Coordination normal.  Skin: Skin is warm and dry. No rash noted. She is not diaphoretic. No erythema. No pallor.  Psychiatric: Her speech is normal and behavior is normal. Judgment and thought content normal. Her mood appears anxious. She exhibits a depressed mood. She expresses no suicidal ideation.           Assessment & Plan:  Over 40min of which >50% spent in face-to-face contact with patient discussing plan of care.  Problem List Items Addressed This Visit      Unprioritized   Depression    Worsening depression. Will add Wellbutrin 150mg  daily. Continue Cymbalta. Encouraged her to take time for herself.      Relevant Medications   DULoxetine (CYMBALTA) 60 MG capsule   buPROPion (WELLBUTRIN XL) 150 MG 24 hr tablet   Hypertension    BP Readings from Last 3 Encounters:  08/16/15 134/80  05/30/15 148/82  02/28/15 118/78   BP well controlled. Renal function recently at [facility] normal. Will continue current medications.      Relevant Medications   losartan-hydrochlorothiazide (HYZAAR) 100-12.5 MG tablet   nebivolol (BYSTOLIC) 5 MG tablet   Insomnia    Encouraged her to limit dose of Ambien. Discussed that Ambien is considered obesigenic.       Obesity - Primary    Wt Readings from Last 3 Encounters:  08/16/15 203 lb 6.4 oz (92.262 kg)  05/30/15 199 lb 12.8 oz (90.629 kg)  02/28/15 196 lb (88.905 kg)   Body mass index is 33.85 kg/(m^2). Encouraged her to limit carbohydrates in her diet. Encouraged physical activity. Discussed several options to help with appetite. Will start Saxenda 0.6mg  daily. Discussed potential risks of this medication. Follow up recheck in 2 weeks.      Relevant Medications   Liraglutide -Weight Management (SAXENDA) 18 MG/3ML SOPN   Sleep apnea    Not tolerating CPAP well. Has follow up with sleep specialist. Encouraged her to keep this follow up as perhaps a new mask might be helpful.          Return in about 2 weeks (around 08/30/2015).  Ronna PolioJennifer Walker, MD Internal Medicine [facility]

## 2015-08-18 ENCOUNTER — Telehealth: Payer: Self-pay | Admitting: Internal Medicine

## 2015-08-18 MED ORDER — LIRAGLUTIDE -WEIGHT MANAGEMENT 18 MG/3ML ~~LOC~~ SOPN
0.6000 mg | PEN_INJECTOR | Freq: Every day | SUBCUTANEOUS | Status: DC
Start: 2015-08-18 — End: 2015-08-31

## 2015-08-18 NOTE — Telephone Encounter (Signed)
Ok. Thank you.

## 2015-08-18 NOTE — Telephone Encounter (Signed)
Pt called about wanting to get a Rx for Saxenda it will run 800.00 for the medication. It's the same as Victoza her insurance will pay for it. The drug card that was given is not working it only paid half. Pt was told she would have to pay 30.00 a month.   Pharmacy is [facility] [place], [place] - [address]  Call pt @ [phone]. Thank you!

## 2015-08-18 NOTE — Telephone Encounter (Signed)
Ok. Thanks. Called patient to notify Rx sent.  No answer. Patient will need to be scheduled for 1-2 week f/u.

## 2015-08-18 NOTE — Telephone Encounter (Signed)
Please start Saxenda 0.6mg  injected [place] daily disp 3ml with 1 refill. She will need a 1-2 week follow up

## 2015-08-21 NOTE — Telephone Encounter (Signed)
I called pt about her appt, Pt asked about her Rx regarding Saxenda which will be 750.00 copay and Victoza will be covered under her ins. Please advise?  Call pt @ [phone]. Thank you!

## 2015-08-21 NOTE — Telephone Encounter (Signed)
Please advise based on note below, insurance coverage for one not the other. thanks

## 2015-08-22 MED ORDER — LIRAGLUTIDE 18 MG/3ML ~~LOC~~ SOPN: 0.6000 mg | PEN_INJECTOR | Freq: Every day | SUBCUTANEOUS | 1 refills | Status: DC

## 2015-08-22 NOTE — Telephone Encounter (Signed)
Noted thanks °

## 2015-08-22 NOTE — Telephone Encounter (Signed)
Patient stated that she has talked to four different people today, she stated that she will not need an appt, if she's not taking the Saxenda. She was informed of the note .

## 2015-08-22 NOTE — Telephone Encounter (Signed)
Insurance will not cover Victoza typically unless a pt has diabetes.

## 2015-08-22 NOTE — Telephone Encounter (Signed)
Spoke with the patient, she states that the [place] is too expensive, not able to afford it.  She is Pre-diabetic with labs and states with her work physical is a moderate risk for diabetes, so if you would order the victoza she would like to try if not she understands.  Thanks

## 2015-08-22 NOTE — Telephone Encounter (Signed)
OK. Fine to start Victoza 0.6mg  injected [place] daily Disp 7ml with 1 refill. Will need 4 week follow up

## 2015-08-22 NOTE — Telephone Encounter (Signed)
Sent to pharmacy, she will need a 4 week follow up with new provider, thanks

## 2015-08-25 ENCOUNTER — Telehealth: Payer: Self-pay

## 2015-08-25 NOTE — Telephone Encounter (Signed)
PA for Victoza completed on Cover my meds

## 2015-08-25 NOTE — Telephone Encounter (Signed)
PA was denied, for medical necessity.

## 2015-08-29 NOTE — Progress Notes (Signed)
[facility] Consultation      Assessment and Plan:  Obstructive sleep apnea. --tolerating pressure and mask well, though continues to remove mask unconsciously.  --Increase AUTO-PAP pressure to 5-15.  --Refer to mask fitting clinic. If not possible, may switch to a larger mask.    Excessive daytime sleepiness. --This may be multifactorial from OSA and from inadequate sleep due to anxiety and insomnia.   Essential hypertension. --OSA may contribute to hypertension, therefore it would be important to treat it.   Insomnia.  --Sleep maintenance type, continue ambien 5 mg qhs.  --Discussed changing to ambien CR 6.25 but she just refilled her current [employer].    Date: 08/29/2015  MRN# [id number] [name] July 02, 1955  Referring Physician: Dr Dan Humphreys.   [name] is a 60 y.o. old female seen in consultation for chief complaint of:    Chief Complaint  Patient presents with  . Follow-up    31mo rov.     HPI:   The patient is a 60 year old female with a history of insomnia, essential hypertension, obesity, obstructive sleep apnea. She has been diagnosed with obstructive sleep apnea in the past, however, she could not tolerate CPAP.She is currently using a full face mask , at last visit she was started on a non-therapeutic level of auto-bipap to get her used to the pressures, she was also referred to [facility] for mask fitting, which has not happened yet.  She feels that she is sleeping better, she had some initial improvement with daytime sleepiness, and she is more used to the mask but she still unconsciously takes it off.    She has been taking ambien 5 mg for at least 10 years, she takes half of a 10 mg pill every night. She has tried to stop, but is not able to sleep without it.  She is unable to make herself go to sleep when she wants to. She wakes up frequently during the night. She has never been on ambien CR. She has been on lunesta in the past  but could not tolerate the taste.   Review of tracings from [facility] data: Auto set from 5-8 centimeters of water: Residual AHI is 4, average usage is 5 hours. Frequent leaks past 40 L. home sleep study 08/15/2014: 39 sleep study showed AHI of 18, or 29 per AASM criteria.    Medication:   Reviewed.     Allergies:  Review of patient's allergies indicates no known allergies.  Review of Systems: Gen:  Denies  fever, sweats, chills HEENT: Denies blurred vision, double vision. bleeds, sore throat Cvc:  No dizziness, chest pain. Resp:   Denies cough or sputum production, shortness of breath Gi: Denies swallowing difficulty, stomach pain. Gu:  Denies bladder incontinence, burning urine Ext:   No Joint pain, stiffness. Skin: No skin rash,  hives  Endoc:  No polyuria, polydipsia. Psych: No depression, insomnia. Other:  All other systems were reviewed with the patient and were negative other that what is mentioned in the HPI.   Physical Examination:   VS: There were no vitals taken for this visit.  General Appearance: No distress  Neuro:without focal findings,  speech normal,  HEENT: PERRLA, EOM intact.  [place].  Pulmonary: normal breath sounds, No wheezing.  CardiovascularNormal S1,S2.  No m/r/g.   Abdomen: Benign, Soft, non-tender. Renal:  No costovertebral tenderness  GU:  No performed at this time. Endoc: No evident thyromegaly, no signs of acromegaly. Skin:   warm, no rashes,  no ecchymosis  Extremities: normal, no cyanosis, clubbing.  Other findings:    LABORATORY PANEL:   CBC No results for input(s): WBC, HGB, HCT, PLT in the last 168 hours. ------------------------------------------------------------------------------------------------------------------  Chemistries  No results for input(s): NA, K, CL, CO2, GLUCOSE, BUN, CREATININE, CALCIUM, MG, AST, ALT, ALKPHOS, BILITOT in the last 168 hours.  Invalid input(s):  GFRCGP ------------------------------------------------------------------------------------------------------------------  Cardiac Enzymes No results for input(s): TROPONINI in the last 168 hours. ------------------------------------------------------------  RADIOLOGY:  No results found.     Thank  you for the consultation and for allowing [facility] to assist in the care of your patient. Our recommendations are noted above.  Please contact us if we can be of further service.   Wells Guiles, MD.  Board Certified in Internal Medicine, Pulmonary Medicine, Critical Care Medicine, and Sleep Medicine.  [facility] Office Number: [phone]  Santiago Glad, M.D.  Stephanie Acre, M.D.  Billy Fischer, M.D  08/29/2015

## 2015-08-30 ENCOUNTER — Encounter: Payer: Self-pay | Admitting: Internal Medicine

## 2015-08-30 ENCOUNTER — Telehealth: Payer: Self-pay | Admitting: *Deleted

## 2015-08-30 NOTE — Telephone Encounter (Signed)
Pt returned call and stated that she still has her cpap and has been using it every night.  Pt is planning on keeping appointment on 08/31/15 with Dr. Nicholos Johns. Nothing else needed at this time. Rhonda J Cobb

## 2015-08-30 NOTE — Telephone Encounter (Signed)
LMOVM for pt to call back in regards to appt for 08/31/15. Bjorn Loser tried calling pt 08/29/15 but got no response.

## 2015-08-30 NOTE — Telephone Encounter (Signed)
-----   Message from Shane Crutch, MD sent at 08/29/2015  3:10 PM EDT ----- Regarding: pt coming in? Pt. Has an appt this week for insomnia/OSA follow up, but her CPAP was taken away. Pls confirm with pt whether she plans to come in. Thanks.

## 2015-08-31 ENCOUNTER — Encounter: Payer: Self-pay | Admitting: Internal Medicine

## 2015-08-31 ENCOUNTER — Ambulatory Visit (INDEPENDENT_AMBULATORY_CARE_PROVIDER_SITE_OTHER): Payer: Managed Care, Other (non HMO) | Admitting: Internal Medicine

## 2015-08-31 VITALS — BP 132/68 | HR 78 | Ht 64.5 in | Wt 202.4 lb

## 2015-08-31 DIAGNOSIS — G4733 Obstructive sleep apnea (adult) (pediatric): Secondary | ICD-10-CM

## 2015-08-31 NOTE — Patient Instructions (Addendum)
--  Increase AUTO-PAP pressure to 5-15.  --Refer to mask fitting clinic. If not possible, may switch to a larger mask.

## 2015-09-01 ENCOUNTER — Ambulatory Visit: Payer: Managed Care, Other (non HMO) | Admitting: Internal Medicine

## 2015-09-06 ENCOUNTER — Ambulatory Visit (HOSPITAL_BASED_OUTPATIENT_CLINIC_OR_DEPARTMENT_OTHER): Payer: Managed Care, Other (non HMO) | Attending: Internal Medicine | Admitting: Radiology

## 2015-09-06 DIAGNOSIS — G4733 Obstructive sleep apnea (adult) (pediatric): Secondary | ICD-10-CM

## 2015-09-20 ENCOUNTER — Other Ambulatory Visit: Payer: Self-pay

## 2015-09-20 DIAGNOSIS — G43809 Other migraine, not intractable, without status migrainosus: Secondary | ICD-10-CM

## 2015-09-20 MED ORDER — SUMATRIPTAN SUCCINATE 100 MG PO TABS: 100.0000 mg | ORAL_TABLET | Freq: Every day | ORAL | 3 refills | Status: DC | PRN

## 2015-09-20 NOTE — Telephone Encounter (Signed)
No follow up on file, please advise refill, thanks

## 2015-09-29 ENCOUNTER — Other Ambulatory Visit: Payer: Self-pay | Admitting: Family Medicine

## 2015-09-29 ENCOUNTER — Other Ambulatory Visit: Payer: Self-pay | Admitting: *Deleted

## 2015-09-29 MED ORDER — SUMATRIPTAN SUCCINATE 6 MG/0.5ML ~~LOC~~ SOTJ: 6.0000 mg | SUBCUTANEOUS | 6 refills | Status: DC | PRN

## 2015-09-29 NOTE — Telephone Encounter (Signed)
I'm not sure what to order either.  Please contact pharmacy.

## 2015-09-29 NOTE — Telephone Encounter (Signed)
I resent with new pen in the order. We will see if it works

## 2015-09-29 NOTE — Telephone Encounter (Signed)
Patient called and states she got a prescription from Dr.Walker for Imitrex injection needles. . Patient states she needs the Imitrex  Pen form of this medication. She had to send the injection needles back to the pharmacy. Patient pharmacy is [facility].  Patient has a est care appt with Claris CheMargaret on 12/18/15. Thank you

## 2015-09-29 NOTE — Telephone Encounter (Signed)
Please advise, patient has a follow up appt with Arnett, is requesting the Sumatriptan kit  (Pen needle one) not the injection that is on file.  Please advise, I am not sure how to order that. thanks

## 2015-12-18 ENCOUNTER — Ambulatory Visit: Payer: Managed Care, Other (non HMO) | Admitting: Family

## 2016-02-09 ENCOUNTER — Ambulatory Visit (INDEPENDENT_AMBULATORY_CARE_PROVIDER_SITE_OTHER): Payer: Managed Care, Other (non HMO) | Admitting: Internal Medicine

## 2016-02-09 ENCOUNTER — Encounter: Payer: Self-pay | Admitting: Internal Medicine

## 2016-02-09 VITALS — BP 110/68 | HR 76 | Temp 97.6°F | Ht 64.0 in | Wt 180.0 lb

## 2016-02-09 DIAGNOSIS — F5101 Primary insomnia: Secondary | ICD-10-CM

## 2016-02-09 DIAGNOSIS — G4733 Obstructive sleep apnea (adult) (pediatric): Secondary | ICD-10-CM

## 2016-02-09 DIAGNOSIS — F324 Major depressive disorder, single episode, in partial remission: Secondary | ICD-10-CM | POA: Diagnosis not present

## 2016-02-09 DIAGNOSIS — G43C0 Periodic headache syndromes in child or adult, not intractable: Secondary | ICD-10-CM | POA: Diagnosis not present

## 2016-02-09 DIAGNOSIS — I10 Essential (primary) hypertension: Secondary | ICD-10-CM | POA: Diagnosis not present

## 2016-02-09 MED ORDER — ZOLPIDEM TARTRATE 10 MG PO TABS: 10.0000 mg | ORAL_TABLET | Freq: Every evening | ORAL | 1 refills | Status: DC | PRN

## 2016-02-09 NOTE — Progress Notes (Signed)
Date:  02/09/2016   Name:  [name]   DOB:  1955-10-26   MRN:  [id number]  Previous patient of Dr. Dan HumphreysWalker.  Chief Complaint: Establish Care Hypertension  This is a chronic problem. The current episode started more than 1 year ago. The problem has been rapidly improving since onset. The problem is controlled. Pertinent negatives include no chest pain, palpitations or shortness of breath. Past treatments include beta blockers, angiotensin blockers and diuretics.  Migraine   This is a chronic problem. The problem occurs seasonly. The problem has been gradually improving. The pain is located in the occipital and right unilateral region. The pain does not radiate. The quality of the pain is described as aching and band-like. Associated symptoms include insomnia. Pertinent negatives include no abdominal pain, coughing or dizziness. Her past medical history is significant for hypertension.  Depression         This is a chronic problem.  Associated symptoms include insomnia.  Associated symptoms include no decreased concentration, no fatigue, no decreased interest, no appetite change, not sad and no suicidal ideas.  Compliance with treatment is good.  Previous treatment provided significant relief. OSA - not able to tolerate the mask.  She takes it off in her sleep.    Review of Systems  Constitutional: Negative for appetite change, fatigue and unexpected weight change.  Respiratory: Negative for cough, chest tightness, shortness of breath and wheezing.   Cardiovascular: Negative for chest pain, palpitations and leg swelling.  Gastrointestinal: Negative for abdominal pain.  Genitourinary: Negative for frequency and menstrual problem (hot flashes).  Musculoskeletal: Negative for arthralgias and joint swelling.  Neurological: Negative for dizziness and light-headedness.  Psychiatric/Behavioral: Positive for depression and sleep disturbance. Negative for decreased concentration, dysphoric  mood and suicidal ideas. The patient has insomnia.     Patient Active Problem List   Diagnosis Date Noted  . Sleep apnea 02/28/2015  . Obesity 02/28/2015  . Routine general medical examination at a health care facility 08/04/2013  . Hemorrhoids 03/04/2012  . Insomnia 03/04/2012  . Arthralgia 03/04/2012  . Migraines 03/04/2012  . Depression 12/12/2010  . Major depressive disorder, single episode 12/12/2010  . Hypertension 11/08/2010    Prior to Admission medications   Medication Sig Start Date End Date Taking? Authorizing Provider  DULoxetine (CYMBALTA) 60 MG capsule Take 1 capsule by mouth  daily 08/16/15  Yes Shelia MediaJennifer A Walker, MD  losartan-hydrochlorothiazide [facility](HYZAAR) 100-12.5 MG tablet Take 1 tablet by mouth  daily 08/16/15  Yes Shelia MediaJennifer A Walker, MD  naproxen (NAPROSYN) 500 MG tablet Take 1 tablet (500 mg total) by mouth daily as needed. 07/20/14  Yes Shelia MediaJennifer A Walker, MD  nebivolol (BYSTOLIC) 5 MG tablet Take 1 tablet by mouth  daily 08/16/15  Yes Shelia MediaJennifer A Walker, MD  SUMAtriptan (IMITREX) 100 MG tablet Take 1 tablet (100 mg total) by mouth daily as needed. 09/20/15  Yes Tommie SamsJayce G Cook, DO  SUMAtriptan Succinate 6 MG/0.5ML SOTJ Inject 0.5 mLs (6 mg total) into the skin every 2 (two) hours as needed. 09/29/15  Yes Tommie SamsJayce G Cook, DO  zolpidem (AMBIEN) 10 MG tablet Take 1 tablet (10 mg total) by mouth at bedtime as needed for sleep. 08/16/15  Yes Shelia MediaJennifer A Walker, MD    No Known Allergies  Past Surgical History:  Procedure Laterality Date  . ABDOMINAL HYSTERECTOMY     for Menorrhagia  . BREAST REDUCTION SURGERY    . LAPAROSCOPY      Social History  Substance  Use Topics  . Smoking status: Never Smoker  . Smokeless tobacco: Never Used  . Alcohol use Yes     Comment: wine 2x a week     Medication list has been reviewed and updated.   Physical Exam  Constitutional: She is oriented to person, place, and time. She appears well-developed. No distress.  HENT:  Head:  Normocephalic and atraumatic.  Neck: Normal range of motion. Neck supple. No thyromegaly present.  Cardiovascular: Normal rate, regular rhythm and normal heart sounds.   Pulmonary/Chest: Effort normal and breath sounds normal. No respiratory distress. She has no wheezes.  Musculoskeletal: Normal range of motion. She exhibits no edema or tenderness.  Neurological: She is alert and oriented to person, place, and time.  Skin: Skin is warm and dry. No rash noted.  Psychiatric: She has a normal mood and affect. Her behavior is normal. Thought content normal.  Nursing note and vitals reviewed.   BP 110/68   Pulse 76   Temp 97.6 F (36.4 C)   Ht 5\' 4"  (1.626 m)   Wt 180 lb (81.6 kg)   SpO2 96%   BMI 30.90 kg/m   Assessment and Plan: 1. Essential hypertension Well controlled bystolic expensive so may need to change in the future - CBC with Differential/Platelet - Comprehensive metabolic panel - Lipid panel  2. Periodic headache syndrome, not intractable Rare migraines currently  3. Major depressive disorder with single episode, in partial remission (HCC) Doing well on medication - TSH  4. Primary insomnia - zolpidem (AMBIEN) 10 MG tablet; Take 1 tablet (10 mg total) by mouth at bedtime as needed for sleep.  Dispense: 90 tablet; Refill: 1   Bari Edward, MD [facility]  02/09/2016

## 2016-02-12 ENCOUNTER — Ambulatory Visit: Payer: Managed Care, Other (non HMO) | Admitting: Family

## 2016-02-16 ENCOUNTER — Telehealth: Payer: Self-pay | Admitting: *Deleted

## 2016-02-16 NOTE — Telephone Encounter (Signed)
Pt called had hysterectomy 10 years ago with Dr.Gottsengen asked if her cervix was removed. I explained to patient we now have epic and her paper chart has to be ordered and it is a $30 fee. Pt declined the fee and said to cancel the request for her chart.

## 2016-03-20 ENCOUNTER — Other Ambulatory Visit: Payer: Managed Care, Other (non HMO)

## 2016-03-20 ENCOUNTER — Other Ambulatory Visit: Payer: Self-pay

## 2016-03-20 DIAGNOSIS — I10 Essential (primary) hypertension: Secondary | ICD-10-CM

## 2016-03-27 LAB — COMPREHENSIVE METABOLIC PANEL
ALBUMIN: 4.4 g/dL (ref 3.5–5.5)
ALT: 25 IU/L (ref 0–32)
AST: 22 IU/L (ref 0–40)
Albumin/Globulin Ratio: 1.8 (ref 1.2–2.2)
Alkaline Phosphatase: 124 IU/L — ABNORMAL HIGH (ref 39–117)
BILIRUBIN TOTAL: 0.4 mg/dL (ref 0.0–1.2)
BUN/Creatinine Ratio: 28 — ABNORMAL HIGH (ref 9–23)
BUN: 19 mg/dL (ref 6–24)
CALCIUM: 9.9 mg/dL (ref 8.7–10.2)
CHLORIDE: 99 mmol/L (ref 96–106)
CO2: 26 mmol/L (ref 18–29)
CREATININE: 0.69 mg/dL (ref 0.57–1.00)
GFR calc non Af Amer: 96 mL/min/{1.73_m2} (ref >59)
GFR, EST AFRICAN AMERICAN: 110 mL/min/{1.73_m2} (ref >59)
GLUCOSE: 98 mg/dL (ref 65–99)
Globulin, Total: 2.5 g/dL (ref 1.5–4.5)
Potassium: 5.2 mmol/L (ref 3.5–5.2)
Sodium: 140 mmol/L (ref 134–144)
TOTAL PROTEIN: 6.9 g/dL (ref 6.0–8.5)

## 2016-03-27 LAB — CBC WITH DIFFERENTIAL/PLATELET
BASOS ABS: 0 10*3/uL (ref 0.0–0.2)
Basos: 0 %
EOS (ABSOLUTE): 0.1 10*3/uL (ref 0.0–0.4)
EOS: 2 %
HEMATOCRIT: 43.4 % (ref 34.0–46.6)
HEMOGLOBIN: 14.4 g/dL (ref 11.1–15.9)
IMMATURE GRANS (ABS): 0 10*3/uL (ref 0.0–0.1)
Immature Granulocytes: 0 %
Lymphocytes Absolute: 1.6 10*3/uL (ref 0.7–3.1)
Lymphs: 24 %
MCH: 30.7 pg (ref 26.6–33.0)
MCHC: 33.2 g/dL (ref 31.5–35.7)
MCV: 93 fL (ref 79–97)
Monocytes Absolute: 0.5 10*3/uL (ref 0.1–0.9)
Monocytes: 8 %
NEUTROS ABS: 4.2 10*3/uL (ref 1.4–7.0)
Neutrophils: 66 %
Platelets: 296 10*3/uL (ref 150–379)
RBC: 4.69 x10E6/uL (ref 3.77–5.28)
RDW: 12.6 % (ref 12.3–15.4)
WBC: 6.4 10*3/uL (ref 3.4–10.8)

## 2016-03-27 LAB — LIPID PANEL
CHOLESTEROL TOTAL: 188 mg/dL (ref 100–199)
Chol/HDL Ratio: 5.1 ratio units — ABNORMAL HIGH (ref 0.0–4.4)
HDL: 37 mg/dL — ABNORMAL LOW (ref >39)
LDL Calculated: 117 mg/dL — ABNORMAL HIGH (ref 0–99)
Triglycerides: 170 mg/dL — ABNORMAL HIGH (ref 0–149)
VLDL Cholesterol Cal: 34 mg/dL (ref 5–40)

## 2016-03-27 LAB — TSH: TSH: 1.67 u[IU]/mL (ref 0.450–4.500)

## 2016-04-03 ENCOUNTER — Other Ambulatory Visit: Payer: Self-pay | Admitting: Family Medicine

## 2016-04-04 ENCOUNTER — Other Ambulatory Visit: Payer: Self-pay

## 2016-06-25 ENCOUNTER — Telehealth: Payer: Self-pay | Admitting: Internal Medicine

## 2016-06-25 DIAGNOSIS — G4733 Obstructive sleep apnea (adult) (pediatric): Secondary | ICD-10-CM

## 2016-06-25 NOTE — Telephone Encounter (Signed)
Called patient back and offered mask fitting class. She says she went and was still having trouble with mask She has not worn cpap in 6 mos due to mask fitting. A new order has been placed per patient request. Patient is aware Dr. Ardyth Manam not in office this week. Nothing further needed.

## 2016-06-25 NOTE — Telephone Encounter (Signed)
Pt came by office asking for Prescription for her to get have a new mask  Can't use the one he has, she states she's stopped using the one she has.  She is wanting to use the  Dream Wear Full face mask with [employer] Fitpack  [facility] that if we sent in one it may be covered by insurance Please advise.

## 2016-07-02 ENCOUNTER — Ambulatory Visit (INDEPENDENT_AMBULATORY_CARE_PROVIDER_SITE_OTHER): Payer: Managed Care, Other (non HMO) | Admitting: Internal Medicine

## 2016-07-02 ENCOUNTER — Encounter: Payer: Self-pay | Admitting: Internal Medicine

## 2016-07-02 VITALS — BP 124/64 | HR 65 | Ht 64.0 in | Wt 182.0 lb

## 2016-07-02 DIAGNOSIS — G43C0 Periodic headache syndromes in child or adult, not intractable: Secondary | ICD-10-CM

## 2016-07-02 DIAGNOSIS — F324 Major depressive disorder, single episode, in partial remission: Secondary | ICD-10-CM

## 2016-07-02 DIAGNOSIS — M7071 Other bursitis of hip, right hip: Secondary | ICD-10-CM

## 2016-07-02 DIAGNOSIS — I10 Essential (primary) hypertension: Secondary | ICD-10-CM

## 2016-07-02 DIAGNOSIS — W5503XA Scratched by cat, initial encounter: Secondary | ICD-10-CM

## 2016-07-02 DIAGNOSIS — Z23 Encounter for immunization: Secondary | ICD-10-CM

## 2016-07-02 MED ORDER — NEBIVOLOL HCL 5 MG PO TABS: ORAL_TABLET | ORAL | 3 refills | Status: DC

## 2016-07-02 MED ORDER — LOSARTAN POTASSIUM-HCTZ 100-12.5 MG PO TABS: ORAL_TABLET | ORAL | 3 refills | Status: DC

## 2016-07-02 MED ORDER — DULOXETINE HCL 30 MG PO CPEP: 30.0000 mg | ORAL_CAPSULE | Freq: Every day | ORAL | 3 refills | Status: DC

## 2016-07-02 MED ORDER — SUMATRIPTAN SUCCINATE 6 MG/0.5ML ~~LOC~~ SOTJ: 6.0000 mg | SUBCUTANEOUS | 6 refills | Status: DC | PRN

## 2016-07-02 NOTE — Progress Notes (Signed)
Date:  07/02/2016   Name:  [name]   DOB:  [birth year]   MRN:  [id number]   Chief Complaint: Hip Pain (Right hip pain comes and goes. Hurts worse at night. Switching positions while sleeping helps. Pain feels like aching cramping feeling. Pain radiates down to knee just on Right side. ) Hip Pain   There was no injury mechanism. The pain is present in the right hip. The quality of the pain is described as aching and cramping. The pain is moderate. The pain has been improving since onset.  Depression         This is a chronic problem.  The problem has been gradually worsening since onset.  Associated symptoms include decreased concentration, decreased interest, myalgias and sad.  Associated symptoms include no suicidal ideas.  Past treatments include SNRIs - Serotonin and norepinephrine reuptake inhibitors (did not do well on Wellbutrin; concerned about weight gain).  Cat scratch - of left hand by a feral cat that she feeds.  It bruised but is not hot or draining.  Unsure when last tetanus given.  Has hand surgery planned in 6 days - she needs to ask surgeon to be sure he will proceed with the current injury.   Review of Systems  Constitutional: Negative for chills, fever and unexpected weight change.  Respiratory: Negative for chest tightness, shortness of breath and wheezing.   Cardiovascular: Negative for chest pain, palpitations and leg swelling.  Musculoskeletal: Positive for arthralgias and myalgias. Negative for joint swelling.  Skin: Positive for color change and wound.  Psychiatric/Behavioral: Positive for decreased concentration and depression. Negative for suicidal ideas.    Patient Active Problem List   Diagnosis Date Noted  . Sleep apnea 02/28/2015  . Obesity 02/28/2015  . Routine general medical examination at a health care facility 08/04/2013  . Hemorrhoids 03/04/2012  . Insomnia 03/04/2012  . Arthralgia 03/04/2012  . Migraines 03/04/2012  . Major depressive  disorder, single episode 12/12/2010  . Hypertension 11/08/2010    Prior to Admission medications   Medication Sig Start Date End Date Taking? Authorizing Provider  DULoxetine (CYMBALTA) 60 MG capsule Take 1 capsule by mouth  daily 08/16/15  Yes Jackolyn Confer, MD  losartan-hydrochlorothiazide [facility]) 100-12.5 MG tablet Take 1 tablet by mouth  daily 08/16/15  Yes Jackolyn Confer, MD  naproxen (NAPROSYN) 500 MG tablet Take 1 tablet (500 mg total) by mouth daily as needed. 07/20/14  Yes Jackolyn Confer, MD  nebivolol (BYSTOLIC) 5 MG tablet Take 1 tablet by mouth  daily 08/16/15  Yes Jackolyn Confer, MD  SUMAtriptan (IMITREX) 100 MG tablet Take 1 tablet (100 mg total) by mouth daily as needed. 09/20/15  Yes Cook, Jayce G, DO  SUMAtriptan Succinate 6 MG/0.5ML SOTJ Inject 0.5 mLs (6 mg total) into the skin every 2 (two) hours as needed. 09/29/15  Yes Cook, Jayce G, DO  zolpidem (AMBIEN) 10 MG tablet Take 1 tablet (10 mg total) by mouth at bedtime as needed for sleep. 02/09/16  Yes Glean Hess, MD    No Known Allergies  Past Surgical History:  Procedure Laterality Date  . ABDOMINAL HYSTERECTOMY  2008   for Menorrhagia  . BREAST REDUCTION SURGERY    . LAPAROSCOPY      Social History  Substance Use Topics  . Smoking status: Never Smoker  . Smokeless tobacco: Never Used  . Alcohol use Yes     Comment: wine 2x a week  Medication list has been reviewed and updated.   Physical Exam  Constitutional: She is oriented to person, place, and time. She appears well-developed. No distress.  HENT:  Head: Normocephalic and atraumatic.  Neck: Normal range of motion. Neck supple.  Cardiovascular: Normal rate, regular rhythm and normal heart sounds.   Pulmonary/Chest: Effort normal and breath sounds normal. No respiratory distress. She has no wheezes.  Musculoskeletal: Normal range of motion.  Neurological: She is alert and oriented to person, place, and time.  Skin: Skin is warm  and dry. Bruising: left hand associated with three small puncture wounds from cat nails (not bites)  Psychiatric: She has a normal mood and affect. Her speech is normal and behavior is normal. Thought content normal.  Nursing note and vitals reviewed.   BP 124/64   Pulse 65   Ht '5\' 4"'  (1.626 m)   Wt 182 lb (82.6 kg)   SpO2 98%   BMI 31.24 kg/m   Assessment and Plan: 1. Essential hypertension controlled - nebivolol (BYSTOLIC) 5 MG tablet; Take 1 tablet by mouth  daily  Dispense: 90 tablet; Refill: 3 - losartan-hydrochlorothiazide (HYZAAR) 100-12.5 MG tablet; Take 1 tablet by mouth  daily  Dispense: 90 tablet; Refill: 3  2. Periodic headache syndrome, not intractable \\ - SUMAtriptan Succinate 6 MG/0.5ML SOTJ; Inject 0.5 mLs (6 mg total) into the skin every 2 (two) hours as needed.  Dispense: 0.5 mL; Refill: 6  3. Major depressive disorder with single episode, in partial remission (HCC) Will increase medication - DULoxetine (CYMBALTA) 30 MG capsule; Take 1 capsule (30 mg total) by mouth daily.  Dispense: 90 capsule; Refill: 3  4. Bursitis of right hip, unspecified bursa resolved  5. Cat scratch May need to check with Ortho to make sure that the scratches will not cause surgery to be postponed - Tdap vaccine greater than or equal to 7yo IM  6. Need for diphtheria-tetanus-pertussis (Tdap) vaccine - Tdap vaccine greater than or equal to 7yo IM   Meds ordered this encounter  Medications  . DULoxetine (CYMBALTA) 30 MG capsule    Sig: Take 1 capsule (30 mg total) by mouth daily.    Dispense:  90 capsule    Refill:  3    Take along with a 60 mg to equal 90 mg per day  . SUMAtriptan Succinate 6 MG/0.5ML SOTJ    Sig: Inject 0.5 mLs (6 mg total) into the skin every 2 (two) hours as needed.    Dispense:  0.5 mL    Refill:  6    Patient would like the Prefilled syringe (2), and autoinjector pen kit.  Marland Kitchen nebivolol (BYSTOLIC) 5 MG tablet    Sig: Take 1 tablet by mouth  daily     Dispense:  90 tablet    Refill:  3  . losartan-hydrochlorothiazide (HYZAAR) 100-12.5 MG tablet    Sig: Take 1 tablet by mouth  daily    Dispense:  90 tablet    Refill:  Geraldine, MD [facility]  07/02/2016

## 2016-07-02 NOTE — Patient Instructions (Signed)
Tdap Vaccine (Tetanus, Diphtheria and Pertussis): What You Need to Know 1. Why get vaccinated? Tetanus, diphtheria and pertussis are very serious diseases. Tdap vaccine can protect us from these diseases. And, Tdap vaccine given to pregnant women can protect newborn babies against pertussis. TETANUS (Lockjaw) is rare in the [place] today. It causes painful muscle tightening and stiffness, usually all over the body.  It can lead to tightening of muscles in the head and neck so you can't open your mouth, swallow, or sometimes even breathe. Tetanus kills about 1 out of 10 people who are infected even after receiving the best medical care.  DIPHTHERIA is also rare in the [place] today. It can cause a thick coating to form in the back of the throat.  It can lead to breathing problems, heart failure, paralysis, and death.  PERTUSSIS (Whooping Cough) causes severe coughing spells, which can cause difficulty breathing, vomiting and disturbed sleep.  It can also lead to weight loss, incontinence, and rib fractures. Up to 2 in 100 adolescents and 5 in 100 adults with pertussis are hospitalized or have complications, which could include pneumonia or death.  These diseases are caused by bacteria. Diphtheria and pertussis are spread from person to person through secretions from coughing or sneezing. Tetanus enters the body through cuts, scratches, or wounds. Before vaccines, as many as 200,000 cases of diphtheria, 200,000 cases of pertussis, and hundreds of cases of tetanus, were reported in the [place] each year. Since vaccination began, reports of cases for tetanus and diphtheria have dropped by about 99% and for pertussis by about 80%. 2. Tdap vaccine Tdap vaccine can protect adolescents and adults from tetanus, diphtheria, and pertussis. One dose of Tdap is routinely given at age 11 or 12. People who did not get Tdap at that age should get it as soon as possible. Tdap is especially  important for healthcare professionals and anyone having close contact with a baby younger than 12 months. Pregnant women should get a dose of Tdap during every pregnancy, to protect the newborn from pertussis. Infants are most at risk for severe, life-threatening complications from pertussis. Another vaccine, called Td, protects against tetanus and diphtheria, but not pertussis. A Td booster should be given every 10 years. Tdap may be given as one of these boosters if you have never gotten Tdap before. Tdap may also be given after a severe cut or burn to prevent tetanus infection. Your doctor or the person giving you the vaccine can give you more information. Tdap may safely be given at the same time as other vaccines. 3. Some people should not get this vaccine  A person who has ever had a life-threatening allergic reaction after a previous dose of any diphtheria, tetanus or pertussis containing vaccine, OR has a severe allergy to any part of this vaccine, should not get Tdap vaccine. Tell the person giving the vaccine about any severe allergies.  Anyone who had coma or long repeated seizures within 7 days after a childhood dose of DTP or DTaP, or a previous dose of Tdap, should not get Tdap, unless a cause other than the vaccine was found. They can still get Td.  Talk to your doctor if you: ? have seizures or another nervous system problem, ? had severe pain or swelling after any vaccine containing diphtheria, tetanus or pertussis, ? ever had a condition called Guillain-Barr Syndrome (GBS), ? aren't feeling well on the day the shot is scheduled. 4. Risks With any medicine, including   vaccines, there is a chance of side effects. These are usually mild and go away on their own. Serious reactions are also possible but are rare. Most people who get Tdap vaccine do not have any problems with it. Mild problems following Tdap: (Did not interfere with activities)  Pain where the shot was given (about  3 in 4 adolescents or 2 in 3 adults)  Redness or swelling where the shot was given (about 1 person in 5)  Mild fever of at least 100.4F (up to about 1 in 25 adolescents or 1 in 100 adults)  Headache (about 3 or 4 people in 10)  Tiredness (about 1 person in 3 or 4)  Nausea, vomiting, diarrhea, stomach ache (up to 1 in 4 adolescents or 1 in 10 adults)  Chills, sore joints (about 1 person in 10)  Body aches (about 1 person in 3 or 4)  Rash, swollen glands (uncommon)  Moderate problems following Tdap: (Interfered with activities, but did not require medical attention)  Pain where the shot was given (up to 1 in 5 or 6)  Redness or swelling where the shot was given (up to about 1 in 16 adolescents or 1 in 12 adults)  Fever over 102F (about 1 in 100 adolescents or 1 in 250 adults)  Headache (about 1 in 7 adolescents or 1 in 10 adults)  Nausea, vomiting, diarrhea, stomach ache (up to 1 or 3 people in 100)  Swelling of the entire arm where the shot was given (up to about 1 in 500).  Severe problems following Tdap: (Unable to perform usual activities; required medical attention)  Swelling, severe pain, bleeding and redness in the arm where the shot was given (rare).  Problems that could happen after any vaccine:  People sometimes faint after a medical procedure, including vaccination. Sitting or lying down for about 15 minutes can help prevent fainting, and injuries caused by a fall. Tell your doctor if you feel dizzy, or have vision changes or ringing in the ears.  Some people get severe pain in the shoulder and have difficulty moving the arm where a shot was given. This happens very rarely.  Any medication can cause a severe allergic reaction. Such reactions from a vaccine are very rare, estimated at fewer than 1 in a million doses, and would happen within a few minutes to a few hours after the vaccination. As with any medicine, there is a very remote chance of a vaccine  causing a serious injury or death. The safety of vaccines is always being monitored. For more information, visit: www.cdc.gov/vaccinesafety/ 5. What if there is a serious problem? What should I look for? Look for anything that concerns you, such as signs of a severe allergic reaction, very high fever, or unusual behavior. Signs of a severe allergic reaction can include hives, swelling of the face and throat, difficulty breathing, a fast heartbeat, dizziness, and weakness. These would usually start a few minutes to a few hours after the vaccination. What should I do?  If you think it is a severe allergic reaction or other emergency that can't wait, call 9-1-1 or get the person to the nearest hospital. Otherwise, call your doctor.  Afterward, the reaction should be reported to the Vaccine Adverse Event Reporting System (VAERS). Your doctor might file this report, or you can do it yourself through the VAERS web site at www.vaers.hhs.gov, or by calling 1-800-822-7967. ? VAERS does not give medical advice. 6. The National Vaccine Injury Compensation Program The National   Vaccine Injury Compensation Program (VICP) is a federal program that was created to compensate people who may have been injured by certain vaccines. Persons who believe they may have been injured by a vaccine can learn about the program and about filing a claim by calling 1-800-338-2382 or visiting the VICP website at www.hrsa.gov/vaccinecompensation. There is a time limit to file a claim for compensation. 7. How can I learn more?  Ask your doctor. He or she can give you the vaccine package insert or suggest other sources of information.  Call your local or state health department.  Contact the Centers for Disease Control and Prevention (CDC): ? Call 1-800-232-4636 (1-800-CDC-INFO) or ? Visit CDC's website at www.cdc.gov/vaccines CDC Tdap Vaccine VIS (03/23/13) This information is not intended to replace advice given to you by your  health care provider. Make sure you discuss any questions you have with your health care provider. Document Released: 07/16/2011 Document Revised: 10/05/2015 Document Reviewed: 10/05/2015 Elsevier Interactive Patient Education  2017 Elsevier Inc.  

## 2016-07-03 ENCOUNTER — Telehealth: Payer: Self-pay

## 2016-07-03 ENCOUNTER — Other Ambulatory Visit: Payer: Self-pay | Admitting: Internal Medicine

## 2016-07-03 DIAGNOSIS — G43C0 Periodic headache syndromes in child or adult, not intractable: Secondary | ICD-10-CM

## 2016-07-03 MED ORDER — SUMATRIPTAN SUCCINATE 4 MG/0.5ML ~~LOC~~ SOTJ: 4.0000 mg | Freq: Every day | SUBCUTANEOUS | 3 refills | Status: DC | PRN

## 2016-07-03 NOTE — Telephone Encounter (Signed)
Pt informed

## 2016-07-03 NOTE — Telephone Encounter (Signed)
[facility]

## 2016-07-03 NOTE — Telephone Encounter (Signed)
Patient called stating [facility] called her this morning and they stated 6mg  of sumatriptan injection is no longer carried. They discontinued it. The highest dose now is 4 mg. Asked if Dr. Judithann GravesBerglund could call direct line to change RX and speak with pharmacy. The number pt gave is [phone].

## 2016-08-09 ENCOUNTER — Encounter: Payer: Managed Care, Other (non HMO) | Admitting: Internal Medicine

## 2016-10-30 ENCOUNTER — Other Ambulatory Visit: Payer: Self-pay

## 2016-10-30 DIAGNOSIS — I10 Essential (primary) hypertension: Secondary | ICD-10-CM

## 2016-10-30 DIAGNOSIS — F324 Major depressive disorder, single episode, in partial remission: Secondary | ICD-10-CM

## 2016-10-30 MED ORDER — LOSARTAN POTASSIUM-HCTZ 100-12.5 MG PO TABS: ORAL_TABLET | ORAL | 3 refills | Status: DC

## 2016-10-30 MED ORDER — DULOXETINE HCL 30 MG PO CPEP: 30.0000 mg | ORAL_CAPSULE | Freq: Every day | ORAL | 3 refills | Status: DC

## 2016-10-30 MED ORDER — NEBIVOLOL HCL 5 MG PO TABS: ORAL_TABLET | ORAL | 3 refills | Status: DC

## 2016-10-30 MED ORDER — DULOXETINE HCL 60 MG PO CPEP: ORAL_CAPSULE | ORAL | 3 refills | Status: DC

## 2016-12-13 ENCOUNTER — Telehealth: Payer: Self-pay

## 2016-12-13 ENCOUNTER — Telehealth: Payer: Self-pay | Admitting: Internal Medicine

## 2016-12-13 ENCOUNTER — Other Ambulatory Visit: Payer: Self-pay | Admitting: Internal Medicine

## 2016-12-13 DIAGNOSIS — F5101 Primary insomnia: Secondary | ICD-10-CM

## 2016-12-13 MED ORDER — ZOLPIDEM TARTRATE 10 MG PO TABS: 10.0000 mg | ORAL_TABLET | Freq: Every evening | ORAL | 1 refills | Status: DC | PRN

## 2016-12-13 NOTE — Telephone Encounter (Signed)
Pt needs refills sent to [facility] in [place]    zolpidem (AMBIEN) 10 MG tablet [[id number]]    nebivolol (BYSTOLIC) 5 MG tablet [[id number]][[id number]]

## 2016-12-13 NOTE — Telephone Encounter (Signed)
Sent message to dr berglund. Thank you.

## 2016-12-13 NOTE — Telephone Encounter (Signed)
Patient called stating her bystolic is not covered through insurance. Received fax about doing a PA> unsure of what meds she can take until then?  Also, she wants refill on [facility]ien [facility] [place].

## 2016-12-13 NOTE — Telephone Encounter (Signed)
Patient PA done on bystolic with insurance and it was approved through cover my meds. Patient informed and sent to front desk to schedule her physical for next year.

## 2016-12-16 ENCOUNTER — Telehealth: Payer: Self-pay

## 2016-12-16 ENCOUNTER — Other Ambulatory Visit: Payer: Self-pay | Admitting: Internal Medicine

## 2016-12-16 MED ORDER — AMLODIPINE BESYLATE 5 MG PO TABS: 5.0000 mg | ORAL_TABLET | Freq: Every day | ORAL | 0 refills | Status: DC

## 2016-12-16 NOTE — Telephone Encounter (Signed)
Patient called stating after PA was approved for Bystolic medication. She went to pharmacy to pick up meds and found out its over $300. Cannot afford this medication- wants to try something different. Please Advise.

## 2016-12-16 NOTE — Telephone Encounter (Signed)
I will send in another medication - not in the same class.  Amlodipine 5 mg daily.

## 2016-12-16 NOTE — Telephone Encounter (Signed)
Spoke with patient and informed of change in meds. Scheduled a 2 month follow up for HTN

## 2017-01-22 LAB — HM MAMMOGRAPHY

## 2017-02-12 ENCOUNTER — Encounter: Payer: Self-pay | Admitting: Internal Medicine

## 2017-02-12 ENCOUNTER — Ambulatory Visit: Payer: BLUE CROSS/BLUE SHIELD | Admitting: Internal Medicine

## 2017-02-12 VITALS — BP 120/84 | HR 96 | Ht 64.0 in | Wt 193.0 lb

## 2017-02-12 DIAGNOSIS — F324 Major depressive disorder, single episode, in partial remission: Secondary | ICD-10-CM | POA: Diagnosis not present

## 2017-02-12 DIAGNOSIS — G629 Polyneuropathy, unspecified: Secondary | ICD-10-CM

## 2017-02-12 DIAGNOSIS — I10 Essential (primary) hypertension: Secondary | ICD-10-CM | POA: Diagnosis not present

## 2017-02-12 MED ORDER — BISOPROLOL FUMARATE 5 MG PO TABS: 5.0000 mg | ORAL_TABLET | Freq: Every day | ORAL | 5 refills | Status: DC

## 2017-02-12 NOTE — Progress Notes (Signed)
Date:  02/12/2017   Name:  [name]   DOB:  Sep 22, 1955   MRN:  [id number]   Chief Complaint: Hypertension and Headache (5 weeks ago it started. Constantly has a headache that comes and goes. Dull ache- but gets worse depending on what doing. Feels like from sinus'. Congestion in face and nose.  )  Hypertension  This is a chronic problem. The problem is unchanged. The problem is controlled. Associated symptoms include headaches. Pertinent negatives include no chest pain, palpitations or shortness of breath. Past treatments include calcium channel blockers, ACE inhibitors and diuretics.  Headache   Associated symptoms include numbness and sinus pressure. Pertinent negatives include no abdominal pain, ear pain, fever or sore throat. Her past medical history is significant for hypertension.  Depression         This is a chronic problem.The problem is unchanged.  Associated symptoms include headaches.  Associated symptoms include no fatigue.  Past treatments include SNRIs - Serotonin and norepinephrine reuptake inhibitors.  Compliance with treatment is good.  Neuropathic pain - in both feet since treated for plantar fasciitis.  Has burning pain, feels cold and itching.  Taking only advil.  Review of Systems  Constitutional: Negative for chills, fatigue and fever.  HENT: Positive for sinus pressure. Negative for ear discharge, ear pain, postnasal drip, sore throat and trouble swallowing.   Eyes: Negative for visual disturbance.  Respiratory: Negative for chest tightness and shortness of breath.   Cardiovascular: Positive for leg swelling. Negative for chest pain and palpitations.  Gastrointestinal: Negative for abdominal pain.  Allergic/Immunologic: Negative for environmental allergies.  Neurological: Positive for numbness and headaches. Negative for syncope.  Hematological: Negative for adenopathy.  Psychiatric/Behavioral: Positive for depression and sleep disturbance (from foot  pain).    Patient Active Problem List   Diagnosis Date Noted  . Sleep apnea 02/28/2015  . Obesity 02/28/2015  . Routine general medical examination at a health care facility 08/04/2013  . Hemorrhoids 03/04/2012  . Insomnia 03/04/2012  . Arthralgia 03/04/2012  . Migraines 03/04/2012  . Major depressive disorder, single episode 12/12/2010  . Hypertension 11/08/2010    Prior to Admission medications   Medication Sig Start Date End Date Taking? Authorizing Provider  amLODipine (NORVASC) 5 MG tablet Take 1 tablet (5 mg total) daily by mouth. 12/16/16  Yes Reubin MilanBerglund, Kala Ambriz H, MD  DULoxetine (CYMBALTA) 30 MG capsule Take 1 capsule (30 mg total) by mouth daily. 10/30/16  Yes Reubin MilanBerglund, Paydon Carll H, MD  DULoxetine (CYMBALTA) 60 MG capsule Take 1 capsule by mouth  daily 10/30/16  Yes Reubin MilanBerglund, Jake Fuhrmann H, MD  losartan-hydrochlorothiazide [facility](HYZAAR) 100-12.5 MG tablet Take 1 tablet by mouth  daily 10/30/16  Yes Reubin MilanBerglund, Tinia Oravec H, MD  naproxen (NAPROSYN) 500 MG tablet Take 1 tablet (500 mg total) by mouth daily as needed. 07/20/14  Yes Shelia MediaWalker, Jennifer A, MD  SUMAtriptan (IMITREX) 100 MG tablet Take 1 tablet (100 mg total) by mouth daily as needed. 09/20/15  Yes Cook, Jayce G, DO  SUMAtriptan Succinate 4 MG/0.5ML SOTJ Inject 4 mg into the skin daily as needed. 07/03/16  Yes Reubin MilanBerglund, Teisha Trowbridge H, MD  zolpidem (AMBIEN) 10 MG tablet Take 1 tablet (10 mg total) at bedtime as needed by mouth for sleep. 12/13/16  Yes Reubin MilanBerglund, Abelino Tippin H, MD    No Known Allergies  Past Surgical History:  Procedure Laterality Date  . ABDOMINAL HYSTERECTOMY  2008   for Menorrhagia  . BREAST REDUCTION SURGERY    . LAPAROSCOPY  Social History   Tobacco Use  . Smoking status: Never Smoker  . Smokeless tobacco: Never Used  Substance Use Topics  . Alcohol use: Yes    Comment: wine 2x a week  . Drug use: No     Medication list has been reviewed and updated.  PHQ 2/9 Scores 02/12/2017 02/09/2016 08/16/2015  PHQ - 2 Score 0 0 5    PHQ- 9 Score 0 - 18    Physical Exam  Constitutional: She is oriented to person, place, and time. She appears well-developed. No distress.  HENT:  Head: Normocephalic and atraumatic.  Right Ear: Tympanic membrane and ear canal normal.  Left Ear: Tympanic membrane and ear canal normal.  Nose: Right sinus exhibits no maxillary sinus tenderness. Left sinus exhibits no maxillary sinus tenderness.  Mouth/Throat: No posterior oropharyngeal edema or posterior oropharyngeal erythema.  Cardiovascular: Normal rate, regular rhythm and normal heart sounds.  Pulmonary/Chest: Effort normal. No respiratory distress.  Musculoskeletal: Normal range of motion.  Neurological: She is alert and oriented to person, place, and time. No sensory deficit.  Feet are warm Minimal edema Pulses intact Skin intact  Skin: Skin is warm and dry. No rash noted.  Psychiatric: She has a normal mood and affect. Her behavior is normal. Thought content normal.  Nursing note and vitals reviewed.   BP 120/84   Pulse 96   Ht 5\' 4"  (1.626 m)   Wt 193 lb (87.5 kg)   SpO2 99%   BMI 33.13 kg/m   Assessment and Plan: 1. Essential hypertension Stop amlodipine Add bisoprolol - bisoprolol (ZEBETA) 5 MG tablet; Take 1 tablet (5 mg total) by mouth daily.  Dispense: 30 tablet; Refill: 5  2. Neuropathy Check labs Consider Neurology referral - Comprehensive metabolic panel - Hemoglobin A1c - Thyroid Panel With TSH - Vitamin B12 - VITAMIN D 25 Hydroxy (Vit-D Deficiency, Fractures)  3. Major depressive disorder with single episode, in partial remission (HCC) Continue medications   Meds ordered this encounter  Medications  . bisoprolol (ZEBETA) 5 MG tablet    Sig: Take 1 tablet (5 mg total) by mouth daily.    Dispense:  30 tablet    Refill:  5    Partially dictated using Animal nutritionist. Any errors are unintentional.  Bari Edward, MD [facility]  02/12/2017

## 2017-02-13 LAB — THYROID PANEL WITH TSH
FREE THYROXINE INDEX: 1.9 (ref 1.2–4.9)
T3 UPTAKE RATIO: 24 % (ref 24–39)
T4, Total: 7.8 ug/dL (ref 4.5–12.0)
TSH: 1.84 u[IU]/mL (ref 0.450–4.500)

## 2017-02-13 LAB — COMPREHENSIVE METABOLIC PANEL
ALT: 27 IU/L (ref 0–32)
AST: 24 IU/L (ref 0–40)
Albumin/Globulin Ratio: 1.7 (ref 1.2–2.2)
Albumin: 4.5 g/dL (ref 3.6–4.8)
Alkaline Phosphatase: 127 IU/L — ABNORMAL HIGH (ref 39–117)
BUN/Creatinine Ratio: 23 (ref 12–28)
BUN: 14 mg/dL (ref 8–27)
Bilirubin Total: 0.3 mg/dL (ref 0.0–1.2)
CALCIUM: 9.9 mg/dL (ref 8.7–10.3)
CO2: 24 mmol/L (ref 20–29)
Chloride: 100 mmol/L (ref 96–106)
Creatinine, Ser: 0.62 mg/dL (ref 0.57–1.00)
GFR, EST AFRICAN AMERICAN: 113 mL/min/{1.73_m2} (ref >59)
GFR, EST NON AFRICAN AMERICAN: 98 mL/min/{1.73_m2} (ref >59)
GLOBULIN, TOTAL: 2.7 g/dL (ref 1.5–4.5)
Glucose: 124 mg/dL — ABNORMAL HIGH (ref 65–99)
Potassium: 4.6 mmol/L (ref 3.5–5.2)
SODIUM: 141 mmol/L (ref 134–144)
TOTAL PROTEIN: 7.2 g/dL (ref 6.0–8.5)

## 2017-02-13 LAB — VITAMIN B12: Vitamin B-12: 488 pg/mL (ref 232–1245)

## 2017-02-13 LAB — HEMOGLOBIN A1C
ESTIMATED AVERAGE GLUCOSE: 120 mg/dL
Hgb A1c MFr Bld: 5.8 % — ABNORMAL HIGH (ref 4.8–5.6)

## 2017-02-13 LAB — VITAMIN D 25 HYDROXY (VIT D DEFICIENCY, FRACTURES): VIT D 25 HYDROXY: 17.7 ng/mL — AB (ref 30.0–100.0)

## 2017-04-18 ENCOUNTER — Ambulatory Visit: Payer: BLUE CROSS/BLUE SHIELD | Admitting: Internal Medicine

## 2017-04-18 ENCOUNTER — Encounter: Payer: Self-pay | Admitting: Internal Medicine

## 2017-04-18 VITALS — BP 118/84 | HR 72 | Ht 64.0 in | Wt 197.0 lb

## 2017-04-18 DIAGNOSIS — R7303 Prediabetes: Secondary | ICD-10-CM | POA: Diagnosis not present

## 2017-04-18 DIAGNOSIS — I10 Essential (primary) hypertension: Secondary | ICD-10-CM

## 2017-04-18 DIAGNOSIS — M2550 Pain in unspecified joint: Secondary | ICD-10-CM | POA: Diagnosis not present

## 2017-04-18 DIAGNOSIS — G629 Polyneuropathy, unspecified: Secondary | ICD-10-CM

## 2017-04-18 DIAGNOSIS — E119 Type 2 diabetes mellitus without complications: Secondary | ICD-10-CM | POA: Insufficient documentation

## 2017-04-18 DIAGNOSIS — F324 Major depressive disorder, single episode, in partial remission: Secondary | ICD-10-CM

## 2017-04-18 DIAGNOSIS — G43809 Other migraine, not intractable, without status migrainosus: Secondary | ICD-10-CM | POA: Diagnosis not present

## 2017-04-18 DIAGNOSIS — E1165 Type 2 diabetes mellitus with hyperglycemia: Secondary | ICD-10-CM | POA: Insufficient documentation

## 2017-04-18 DIAGNOSIS — E559 Vitamin D deficiency, unspecified: Secondary | ICD-10-CM | POA: Diagnosis not present

## 2017-04-18 DIAGNOSIS — E118 Type 2 diabetes mellitus with unspecified complications: Secondary | ICD-10-CM | POA: Insufficient documentation

## 2017-04-18 MED ORDER — NAPROXEN 500 MG PO TABS: 500.0000 mg | ORAL_TABLET | Freq: Every day | ORAL | 3 refills | Status: DC | PRN

## 2017-04-18 MED ORDER — SUMATRIPTAN SUCCINATE 100 MG PO TABS: 100.0000 mg | ORAL_TABLET | Freq: Every day | ORAL | 3 refills | Status: DC | PRN

## 2017-04-18 MED ORDER — NEBIVOLOL HCL 5 MG PO TABS: 5.0000 mg | ORAL_TABLET | Freq: Every day | ORAL | 5 refills | Status: DC

## 2017-04-18 NOTE — Progress Notes (Signed)
Date:  04/18/2017   Name:  [name]   DOB:  May 31, 1955   MRN:  [id number]   Chief Complaint: Hypertension (Needs refill on naproxen and imitrex tablet. ) Hypertension  This is a chronic problem. Associated symptoms include headaches. Pertinent negatives include no chest pain, palpitations or shortness of breath. Past treatments include beta blockers (amlodipine stopped and bisoprolol started).  Migraine   This is a recurrent problem. The problem occurs intermittently. The pain quality is similar to prior headaches. Associated symptoms include numbness. Pertinent negatives include no abdominal pain, back pain, dizziness, fever or weakness. She has tried triptans for the symptoms. The treatment provided significant relief. Her past medical history is significant for hypertension.   Neuropathy - last done in January were all normal except prediabetes 5.8 and low Vitamin D.  Still has sx in both feet - tingling and burning in the distal feet.  She has tried gabapentin in the past and could not tolerate it.  Has not tried Lyrica.  Very hesitant about seeing Neurology - does not want NCS.  OSA - has the machine but always pulls the mask off after a few hours.  She finally gave up completely using it.  She does not feel that her sleep is the issue - more that the foot pain and headache are keeping her from decent sleep.  Fatigue - from poor sleep and HA.  She feels that the HA is much worse since changing bystolic to bisoprolol.  It is also causing daytime fatigue and she wants to go back to bystolic.  Depression - now on cymbalta 90 mg per day.  Mood is good but no change in foot sx.  Pre-diabetes - not on medications, trying to follow a good diet.   Lab Results  Component Value Date   HGBA1C 5.8 (H) 02/12/2017     Review of Systems  Constitutional: Positive for fatigue. Negative for appetite change, chills, diaphoresis, fever and unexpected weight change.  Eyes: Negative for  visual disturbance.  Respiratory: Negative for chest tightness, shortness of breath and wheezing.   Cardiovascular: Negative for chest pain and palpitations.  Gastrointestinal: Negative for abdominal pain, constipation and diarrhea.  Genitourinary: Negative for dysuria.  Musculoskeletal: Positive for joint swelling and myalgias. Negative for arthralgias, back pain and gait problem.  Neurological: Positive for numbness and headaches. Negative for dizziness, tremors and weakness.  Psychiatric/Behavioral: Positive for sleep disturbance. Negative for decreased concentration and dysphoric mood. The patient is not nervous/anxious.     Patient Active Problem List   Diagnosis Date Noted  . Pre-diabetes 04/18/2017  . Vitamin D deficiency 04/18/2017  . Sleep apnea 02/28/2015  . Obesity 02/28/2015  . Routine general medical examination at a health care facility 08/04/2013  . Hemorrhoids 03/04/2012  . Insomnia 03/04/2012  . Arthralgia 03/04/2012  . Migraines 03/04/2012  . Major depressive disorder, single episode 12/12/2010  . Hypertension 11/08/2010    Prior to Admission medications   Medication Sig Start Date End Date Taking? Authorizing Provider  bisoprolol (ZEBETA) 5 MG tablet Take 1 tablet (5 mg total) by mouth daily. 02/12/17   Reubin Milan, MD  DULoxetine (CYMBALTA) 30 MG capsule Take 1 capsule (30 mg total) by mouth daily. 10/30/16   Reubin Milan, MD  DULoxetine (CYMBALTA) 60 MG capsule Take 1 capsule by mouth  daily 10/30/16   Reubin Milan, MD  losartan-hydrochlorothiazide [facility]) 100-12.5 MG tablet Take 1 tablet by mouth  daily 10/30/16   Judithann Graves,  Nyoka Cowden, MD  naproxen (NAPROSYN) 500 MG tablet Take 1 tablet (500 mg total) by mouth daily as needed. 07/20/14   Shelia Media, MD  SUMAtriptan (IMITREX) 100 MG tablet Take 1 tablet (100 mg total) by mouth daily as needed. 09/20/15   Tommie Sams, DO  SUMAtriptan Succinate 4 MG/0.5ML SOTJ Inject 4 mg into the skin daily as  needed. 07/03/16   Reubin Milan, MD  zolpidem (AMBIEN) 10 MG tablet Take 1 tablet (10 mg total) at bedtime as needed by mouth for sleep. 12/13/16   Reubin Milan, MD    Allergies  Allergen Reactions  . Amlodipine Swelling    And headache    Past Surgical History:  Procedure Laterality Date  . ABDOMINAL HYSTERECTOMY  2008   for Menorrhagia  . BREAST REDUCTION SURGERY    . LAPAROSCOPY      Social History   Tobacco Use  . Smoking status: Never Smoker  . Smokeless tobacco: Never Used  Substance Use Topics  . Alcohol use: Yes    Comment: wine 2x a week  . Drug use: No     Medication list has been reviewed and updated.  PHQ 2/9 Scores 02/12/2017 02/09/2016 08/16/2015  PHQ - 2 Score 0 0 5  PHQ- 9 Score 0 - 18    Physical Exam  Constitutional: She is oriented to person, place, and time. She appears well-developed. No distress.  HENT:  Head: Normocephalic and atraumatic.  Neck: Normal range of motion. Neck supple. No thyromegaly present.  Cardiovascular: Normal rate, regular rhythm and normal heart sounds.  Pulmonary/Chest: Effort normal and breath sounds normal. No respiratory distress. She has no wheezes.  Abdominal: Soft.  Musculoskeletal: Normal range of motion.  Mild soft tissue swelling both hands - no joint deformity or synovitis  Feet - normal pulses, no edema, redness or deformity.  No hyperesthesias.  Neurological: She is alert and oriented to person, place, and time.  Skin: Skin is warm and dry. No rash noted.  Psychiatric: She has a normal mood and affect. Her behavior is normal. Thought content normal.  Nursing note and vitals reviewed.   BP 118/84   Pulse 72   Ht 5\' 4"  (1.626 m)   Wt 197 lb (89.4 kg)   SpO2 96%   BMI 33.81 kg/m   Assessment and Plan: 1. Essential hypertension controlled - nebivolol (BYSTOLIC) 5 MG tablet; Take 1 tablet (5 mg total) by mouth daily.  Dispense: 30 tablet; Refill: 5  2. Major depressive disorder with single  episode, in partial remission (HCC) Continue cymbalta - may have some benefit on neuropathic foot sx  3. Pre-diabetes Continue low carb diet  4. Vitamin D deficiency Continue supplementation  5. Arthralgia - naproxen (NAPROSYN) 500 MG tablet; Take 1 tablet (500 mg total) by mouth daily as needed.  Dispense: 90 tablet; Refill: 3  6. Other migraine without status migrainosus, not intractable - SUMAtriptan (IMITREX) 100 MG tablet; Take 1 tablet (100 mg total) by mouth daily as needed.  Dispense: 30 tablet; Refill: 3  7. Neuropathy - Ambulatory referral to Neurology   Meds ordered this encounter  Medications  . naproxen (NAPROSYN) 500 MG tablet    Sig: Take 1 tablet (500 mg total) by mouth daily as needed.    Dispense:  90 tablet    Refill:  3  . SUMAtriptan (IMITREX) 100 MG tablet    Sig: Take 1 tablet (100 mg total) by mouth daily as needed.  Dispense:  30 tablet    Refill:  3  . nebivolol (BYSTOLIC) 5 MG tablet    Sig: Take 1 tablet (5 mg total) by mouth daily.    Dispense:  30 tablet    Refill:  5    Partially dictated using Animal nutritionistDragon software. Any errors are unintentional.  Bari EdwardLaura Jeany Seville, MD [facility]  04/18/2017

## 2017-05-05 ENCOUNTER — Other Ambulatory Visit: Payer: Self-pay

## 2017-05-05 DIAGNOSIS — I10 Essential (primary) hypertension: Secondary | ICD-10-CM

## 2017-05-05 MED ORDER — NEBIVOLOL HCL 5 MG PO TABS: 5.0000 mg | ORAL_TABLET | Freq: Every day | ORAL | 1 refills | Status: DC

## 2017-05-28 ENCOUNTER — Encounter

## 2017-07-04 ENCOUNTER — Ambulatory Visit (INDEPENDENT_AMBULATORY_CARE_PROVIDER_SITE_OTHER): Payer: BLUE CROSS/BLUE SHIELD | Admitting: Internal Medicine

## 2017-07-04 ENCOUNTER — Encounter: Payer: Self-pay | Admitting: Internal Medicine

## 2017-07-04 VITALS — BP 120/60 | HR 66 | Temp 97.6°F | Resp 16 | Ht 64.5 in | Wt 194.0 lb

## 2017-07-04 DIAGNOSIS — R7303 Prediabetes: Secondary | ICD-10-CM | POA: Diagnosis not present

## 2017-07-04 DIAGNOSIS — Z0000 Encounter for general adult medical examination without abnormal findings: Secondary | ICD-10-CM

## 2017-07-04 DIAGNOSIS — F324 Major depressive disorder, single episode, in partial remission: Secondary | ICD-10-CM

## 2017-07-04 DIAGNOSIS — F5101 Primary insomnia: Secondary | ICD-10-CM | POA: Diagnosis not present

## 2017-07-04 DIAGNOSIS — Z1239 Encounter for other screening for malignant neoplasm of breast: Secondary | ICD-10-CM

## 2017-07-04 DIAGNOSIS — G43009 Migraine without aura, not intractable, without status migrainosus: Secondary | ICD-10-CM

## 2017-07-04 DIAGNOSIS — I10 Essential (primary) hypertension: Secondary | ICD-10-CM | POA: Diagnosis not present

## 2017-07-04 LAB — POCT URINALYSIS DIPSTICK
BILIRUBIN UA: NEGATIVE
Blood, UA: NEGATIVE
Glucose, UA: NEGATIVE
Ketones, UA: NEGATIVE
Leukocytes, UA: NEGATIVE
Nitrite, UA: NEGATIVE
Protein, UA: NEGATIVE
Spec Grav, UA: 1.01 (ref 1.010–1.025)
Urobilinogen, UA: 0.2 E.U./dL
pH, UA: 6 (ref 5.0–8.0)

## 2017-07-04 MED ORDER — ZOLPIDEM TARTRATE 10 MG PO TABS: 10.0000 mg | ORAL_TABLET | Freq: Every evening | ORAL | 5 refills | Status: DC | PRN

## 2017-07-04 MED ORDER — SUMATRIPTAN SUCCINATE 4 MG/0.5ML ~~LOC~~ SOAJ: 4.0000 mg | Freq: Every day | SUBCUTANEOUS | 5 refills | Status: DC | PRN

## 2017-07-04 NOTE — Progress Notes (Signed)
Date:  07/04/2017   Name:  [name]   DOB:  07-01-1955   MRN:  [id number]   Chief Complaint: Annual Exam [name] is a 62 y.o. female who presents today for her Complete Annual Exam. She feels fairly well. She reports exercising walking twice a day 30 min. She reports she is sleeping fairly well. She had mammogram in December.  Hypertension  This is a chronic problem. The problem is unchanged. The problem is controlled. Pertinent negatives include no chest pain, headaches, palpitations or shortness of breath. Past treatments include beta blockers, diuretics and angiotensin blockers. The current treatment provides significant improvement.  Depression         This is a chronic problem.The problem is unchanged.  Associated symptoms include fatigue and insomnia.  Associated symptoms include no headaches.  Past treatments include SNRIs - Serotonin and norepinephrine reuptake inhibitors.  Compliance with treatment is good.  Previous treatment provided significant relief. Migraine   Associated symptoms include insomnia. Pertinent negatives include no abdominal pain, coughing, dizziness, fever, hearing loss, tinnitus or vomiting. Her past medical history is significant for hypertension.  Insomnia  Primary symptoms: no sleep disturbance, frequent awakening.  The problem occurs nightly. The problem is unchanged. Past treatments include medication. PMH includes: depression.  Trouble swallowing - intermittent, no emesis and no pain.  Not with any particular food, can happen just when swallowing saliva.  No hx of reflux.    Review of Systems  Constitutional: Positive for fatigue. Negative for chills and fever.  HENT: Positive for trouble swallowing (intermittent). Negative for congestion, hearing loss, tinnitus and voice change.   Eyes: Negative for visual disturbance.  Respiratory: Negative for cough, chest tightness, shortness of breath and wheezing.   Cardiovascular: Negative for  chest pain, palpitations and leg swelling.  Gastrointestinal: Negative for abdominal pain, constipation, diarrhea and vomiting.  Endocrine: Negative for polydipsia and polyuria.  Genitourinary: Negative for dysuria, frequency, genital sores, vaginal bleeding and vaginal discharge.  Musculoskeletal: Negative for arthralgias, gait problem and joint swelling.  Skin: Negative for color change and rash.  Neurological: Negative for dizziness, tremors, light-headedness and headaches.  Hematological: Negative for adenopathy. Does not bruise/bleed easily.  Psychiatric/Behavioral: Positive for depression. Negative for dysphoric mood and sleep disturbance. The patient has insomnia. The patient is not nervous/anxious.     Patient Active Problem List   Diagnosis Date Noted  . Pre-diabetes 04/18/2017  . Vitamin D deficiency 04/18/2017  . Sleep apnea 02/28/2015  . Obesity 02/28/2015  . Routine general medical examination at a health care facility 08/04/2013  . Hemorrhoids 03/04/2012  . Insomnia 03/04/2012  . Arthralgia 03/04/2012  . Migraine without aura or status migrainosus 03/04/2012  . Major depressive disorder, single episode 12/12/2010  . Essential hypertension 11/08/2010    Prior to Admission medications   Medication Sig Start Date End Date Taking? Authorizing Provider  cholecalciferol (VITAMIN D) 1000 units tablet Take 2,000 Units by mouth daily.    [provider]  DULoxetine (CYMBALTA) 30 MG capsule Take 1 capsule (30 mg total) by mouth daily. 10/30/16   Reubin Milan, MD  DULoxetine (CYMBALTA) 60 MG capsule Take 1 capsule by mouth  daily 10/30/16   Reubin Milan, MD  losartan-hydrochlorothiazide [facility]) 100-12.5 MG tablet Take 1 tablet by mouth  daily 10/30/16   Reubin Milan, MD  naproxen (NAPROSYN) 500 MG tablet Take 1 tablet (500 mg total) by mouth daily as needed. 04/18/17   Reubin Milan, MD  nebivolol (BYSTOLIC) 5 MG tablet Take 1 tablet (5 mg total) by mouth  daily. 05/05/17   Reubin Milan, MD  SUMAtriptan (IMITREX) 100 MG tablet Take 1 tablet (100 mg total) by mouth daily as needed. 04/18/17   Reubin Milan, MD  SUMAtriptan Succinate 4 MG/0.5ML SOTJ Inject 4 mg into the skin daily as needed. 07/03/16   Reubin Milan, MD  zolpidem (AMBIEN) 10 MG tablet Take 1 tablet (10 mg total) at bedtime as needed by mouth for sleep. 12/13/16   Reubin Milan, MD    Allergies  Allergen Reactions  . Amlodipine Swelling    And headache    Past Surgical History:  Procedure Laterality Date  . ABDOMINAL HYSTERECTOMY  2008   for Menorrhagia  . BREAST REDUCTION SURGERY    . LAPAROSCOPY      Social History   Tobacco Use  . Smoking status: Never Smoker  . Smokeless tobacco: Never Used  Substance Use Topics  . Alcohol use: Yes    Comment: wine 2x a week  . Drug use: No     Medication list has been reviewed and updated.  Current Meds  Medication Sig  . cholecalciferol (VITAMIN D) 1000 units tablet Take 2,000 Units by mouth daily.  . DULoxetine (CYMBALTA) 30 MG capsule Take 1 capsule (30 mg total) by mouth daily.  . DULoxetine (CYMBALTA) 60 MG capsule Take 1 capsule by mouth  daily  . Ferrous Sulfate (IRON) 325 (65 Fe) MG TABS Take 1 tablet by mouth daily.  . Fish Oil-Cholecalciferol (OMEGA-3 + D PO) Take 1 capsule by mouth daily.  Marland Kitchen losartan-hydrochlorothiazide (HYZAAR) 100-12.5 MG tablet Take 1 tablet by mouth  daily  . naproxen (NAPROSYN) 500 MG tablet Take 1 tablet (500 mg total) by mouth daily as needed.  . nebivolol (BYSTOLIC) 5 MG tablet Take 1 tablet (5 mg total) by mouth daily.  . SUMAtriptan (IMITREX) 100 MG tablet Take 1 tablet (100 mg total) by mouth daily as needed.  . SUMAtriptan Succinate 4 MG/0.5ML SOTJ Inject 4 mg into the skin daily as needed.  . zolpidem (AMBIEN) 10 MG tablet Take 1 tablet (10 mg total) at bedtime as needed by mouth for sleep.    PHQ 2/9 Scores 07/04/2017 02/12/2017 02/09/2016 08/16/2015  PHQ - 2 Score 0 0  0 5  PHQ- 9 Score 6 0 - 18    Physical Exam  Constitutional: She is oriented to person, place, and time. She appears well-developed and well-nourished. No distress.  HENT:  Head: Normocephalic and atraumatic.  Right Ear: Tympanic membrane and ear canal normal.  Left Ear: Tympanic membrane and ear canal normal.  Nose: Right sinus exhibits no maxillary sinus tenderness. Left sinus exhibits no maxillary sinus tenderness.  Mouth/Throat: Uvula is midline and oropharynx is clear and moist.  Eyes: Conjunctivae and EOM are normal. Right eye exhibits no discharge. Left eye exhibits no discharge. No scleral icterus.  Neck: Normal range of motion. Carotid bruit is not present. No erythema present. No thyromegaly present.  Cardiovascular: Normal rate, regular rhythm, normal heart sounds and normal pulses.  Pulmonary/Chest: Effort normal. No respiratory distress. She has no wheezes. Right breast exhibits no mass, no nipple discharge, no skin change and no tenderness. Left breast exhibits no mass, no nipple discharge, no skin change and no tenderness.  Abdominal: Soft. Bowel sounds are normal. There is no hepatosplenomegaly. There is no tenderness. There is no CVA tenderness.  Musculoskeletal: Normal range of motion.  Lymphadenopathy:  She has no cervical adenopathy.    She has no axillary adenopathy.  Neurological: She is alert and oriented to person, place, and time. She has normal reflexes. No cranial nerve deficit or sensory deficit.  Skin: Skin is warm, dry and intact. No rash noted.  Psychiatric: She has a normal mood and affect. Her speech is normal and behavior is normal. Thought content normal.  Nursing note and vitals reviewed.   BP 120/60   Pulse 66   Temp 97.6 F (36.4 C) (Oral)   Resp 16   Ht 5' 4.5" (1.638 m)   Wt 194 lb (88 kg)   SpO2 97%   BMI 32.79 kg/m   Assessment and Plan: 1. Annual physical exam Work on diet and exercise - Lipid panel - POCT urinalysis  dipstick  2. Breast cancer screening [facility]  3. Essential hypertension controlled - CBC with Differential/Platelet - Comprehensive metabolic panel - TSH  4. Migraine without aura and without status migrainosus, not intractable Stable, intermittent - SUMAtriptan Succinate 4 MG/0.5ML SOAJ; Inject 4 mg into the skin daily as needed.  Dispense: 6 Cartridge; Refill: 5  5. Major depressive disorder with single episode, in partial remission (HCC) Doing fairly well on cymbalta  6. Pre-diabetes Check labs - Hemoglobin A1c  7. Primary insomnia - zolpidem (AMBIEN) 10 MG tablet; Take 1 tablet (10 mg total) by mouth at bedtime as needed for sleep.  Dispense: 30 tablet; Refill: 5   Meds ordered this encounter  Medications  . DISCONTD: zolpidem (AMBIEN) 10 MG tablet    Sig: Take 1 tablet (10 mg total) by mouth at bedtime as needed for sleep.    Dispense:  30 tablet    Refill:  5  . SUMAtriptan Succinate 4 MG/0.5ML SOAJ    Sig: Inject 4 mg into the skin daily as needed.    Dispense:  6 Cartridge    Refill:  5  . zolpidem (AMBIEN) 10 MG tablet    Sig: Take 1 tablet (10 mg total) by mouth at bedtime as needed for sleep.    Dispense:  30 tablet    Refill:  5    Partially dictated using Animal nutritionistDragon software. Any errors are unintentional.  Bari EdwardLaura Otha Rickles, MD [facility]  07/04/2017

## 2017-07-09 ENCOUNTER — Telehealth: Payer: Self-pay | Admitting: Internal Medicine

## 2017-07-09 NOTE — Telephone Encounter (Signed)
Pt did not get labs done at [facility] last week.  Please call her and remind her to get them done soon.

## 2017-07-10 NOTE — Telephone Encounter (Signed)
Please call patient and remind her we need her to get Labs she did not go after visit last week. She can use either lab.

## 2017-07-15 ENCOUNTER — Encounter: Payer: Self-pay | Admitting: Internal Medicine

## 2017-07-15 DIAGNOSIS — E782 Mixed hyperlipidemia: Secondary | ICD-10-CM | POA: Insufficient documentation

## 2017-07-15 DIAGNOSIS — E1169 Type 2 diabetes mellitus with other specified complication: Secondary | ICD-10-CM | POA: Insufficient documentation

## 2017-07-15 LAB — LIPID PANEL
CHOLESTEROL TOTAL: 229 mg/dL — AB (ref 100–199)
Chol/HDL Ratio: 5.2 ratio — ABNORMAL HIGH (ref 0.0–4.4)
HDL: 44 mg/dL (ref >39)
LDL CALC: 148 mg/dL — AB (ref 0–99)
Triglycerides: 183 mg/dL — ABNORMAL HIGH (ref 0–149)
VLDL CHOLESTEROL CAL: 37 mg/dL (ref 5–40)

## 2017-07-15 LAB — CBC WITH DIFFERENTIAL/PLATELET
Basophils Absolute: 0 10*3/uL (ref 0.0–0.2)
Basos: 1 %
EOS (ABSOLUTE): 0.2 10*3/uL (ref 0.0–0.4)
EOS: 3 %
HEMATOCRIT: 42.2 % (ref 34.0–46.6)
Hemoglobin: 14.3 g/dL (ref 11.1–15.9)
IMMATURE GRANS (ABS): 0 10*3/uL (ref 0.0–0.1)
IMMATURE GRANULOCYTES: 0 %
LYMPHS: 24 %
Lymphocytes Absolute: 1.4 10*3/uL (ref 0.7–3.1)
MCH: 30.8 pg (ref 26.6–33.0)
MCHC: 33.9 g/dL (ref 31.5–35.7)
MCV: 91 fL (ref 79–97)
Monocytes Absolute: 0.6 10*3/uL (ref 0.1–0.9)
Monocytes: 10 %
NEUTROS PCT: 62 %
Neutrophils Absolute: 3.7 10*3/uL (ref 1.4–7.0)
PLATELETS: 315 10*3/uL (ref 150–450)
RBC: 4.65 x10E6/uL (ref 3.77–5.28)
RDW: 13 % (ref 12.3–15.4)
WBC: 5.8 10*3/uL (ref 3.4–10.8)

## 2017-07-15 LAB — COMPREHENSIVE METABOLIC PANEL
ALT: 47 IU/L — AB (ref 0–32)
AST: 39 IU/L (ref 0–40)
Albumin/Globulin Ratio: 1.6 (ref 1.2–2.2)
Albumin: 4.3 g/dL (ref 3.6–4.8)
Alkaline Phosphatase: 118 IU/L — ABNORMAL HIGH (ref 39–117)
BUN/Creatinine Ratio: 21 (ref 12–28)
BUN: 13 mg/dL (ref 8–27)
Bilirubin Total: 0.4 mg/dL (ref 0.0–1.2)
CALCIUM: 9.7 mg/dL (ref 8.7–10.3)
CO2: 26 mmol/L (ref 20–29)
CREATININE: 0.61 mg/dL (ref 0.57–1.00)
Chloride: 101 mmol/L (ref 96–106)
GFR calc non Af Amer: 98 mL/min/{1.73_m2} (ref >59)
GFR, EST AFRICAN AMERICAN: 113 mL/min/{1.73_m2} (ref >59)
Globulin, Total: 2.7 g/dL (ref 1.5–4.5)
Glucose: 113 mg/dL — ABNORMAL HIGH (ref 65–99)
Potassium: 4.4 mmol/L (ref 3.5–5.2)
Sodium: 141 mmol/L (ref 134–144)
TOTAL PROTEIN: 7 g/dL (ref 6.0–8.5)

## 2017-07-15 LAB — HEMOGLOBIN A1C
Est. average glucose Bld gHb Est-mCnc: 126 mg/dL
Hgb A1c MFr Bld: 6 % — ABNORMAL HIGH (ref 4.8–5.6)

## 2017-07-15 LAB — TSH: TSH: 2.54 u[IU]/mL (ref 0.450–4.500)

## 2017-12-05 ENCOUNTER — Encounter: Payer: Self-pay | Admitting: Internal Medicine

## 2017-12-05 ENCOUNTER — Ambulatory Visit: Payer: BLUE CROSS/BLUE SHIELD | Admitting: Internal Medicine

## 2017-12-05 VITALS — BP 122/78 | HR 76 | Resp 16 | Ht 64.5 in | Wt 199.0 lb

## 2017-12-05 DIAGNOSIS — R0981 Nasal congestion: Secondary | ICD-10-CM

## 2017-12-05 DIAGNOSIS — R42 Dizziness and giddiness: Secondary | ICD-10-CM

## 2017-12-05 DIAGNOSIS — F322 Major depressive disorder, single episode, severe without psychotic features: Secondary | ICD-10-CM

## 2017-12-05 MED ORDER — FLUOXETINE HCL 20 MG PO TABS: 20.0000 mg | ORAL_TABLET | Freq: Every day | ORAL | 1 refills | Status: DC

## 2017-12-05 NOTE — Patient Instructions (Signed)
Dramamine at bedtime  Use Flonase spray for nasal congestion

## 2017-12-05 NOTE — Progress Notes (Signed)
Date:  12/05/2017   Name:  [name]   DOB:  1955-09-13   MRN:  [id number]   Chief Complaint: Dizziness (Ear feels full on L side and sinuses are hurting x 1 day )  Dizziness  This is a new problem. The current episode started 1 to 4 weeks ago. The problem occurs 2 to 4 times per day. The problem has been unchanged. Associated symptoms include congestion and fatigue. Pertinent negatives include no chest pain, chills, coughing, fever or headaches. The symptoms are aggravated by twisting and bending. She has tried nothing for the symptoms.  Sinus Problem  This is a new problem. The current episode started yesterday. There has been no fever. She is experiencing no pain. Associated symptoms include congestion and sinus pressure. Pertinent negatives include no chills, coughing, headaches or shortness of breath. Past treatments include nothing.  Depression         This is a chronic problem.  The problem has been gradually worsening since onset.  Associated symptoms include fatigue, irritable, decreased interest, appetite change and sad.  Associated symptoms include no headaches and no suicidal ideas.  Past treatments include SNRIs - Serotonin and norepinephrine reuptake inhibitors.  Compliance with treatment is good.  Previous treatment provided moderate relief.   Review of Systems  Constitutional: Positive for appetite change and fatigue. Negative for chills and fever.  HENT: Positive for congestion and sinus pressure.   Respiratory: Negative for cough, chest tightness, shortness of breath and wheezing.   Cardiovascular: Negative for chest pain, palpitations and leg swelling.  Neurological: Positive for dizziness. Negative for headaches.  Psychiatric/Behavioral: Positive for depression. Negative for suicidal ideas.    Patient Active Problem List   Diagnosis Date Noted  . Mixed hyperlipidemia 07/15/2017  . Pre-diabetes 04/18/2017  . Vitamin D deficiency 04/18/2017  . Sleep apnea  02/28/2015  . Obesity 02/28/2015  . Routine general medical examination at a health care facility 08/04/2013  . Hemorrhoids 03/04/2012  . Insomnia 03/04/2012  . Arthralgia 03/04/2012  . Migraine without aura or status migrainosus 03/04/2012  . Major depressive disorder, single episode 12/12/2010  . Essential hypertension 11/08/2010    Allergies  Allergen Reactions  . Amlodipine Swelling    And headache    Past Surgical History:  Procedure Laterality Date  . ABDOMINAL HYSTERECTOMY  2008   for Menorrhagia  . BREAST REDUCTION SURGERY    . LAPAROSCOPY      Social History   Tobacco Use  . Smoking status: Never Smoker  . Smokeless tobacco: Never Used  Substance Use Topics  . Alcohol use: Yes    Comment: wine 2x a week  . Drug use: No     Medication list has been reviewed and updated.  Current Meds  Medication Sig  . cholecalciferol (VITAMIN D) 1000 units tablet Take 2,000 Units by mouth daily.  . DULoxetine (CYMBALTA) 30 MG capsule Take 1 capsule (30 mg total) by mouth daily.  . DULoxetine (CYMBALTA) 60 MG capsule Take 1 capsule by mouth  daily  . losartan-hydrochlorothiazide (HYZAAR) 100-12.5 MG tablet Take 1 tablet by mouth  daily  . naproxen (NAPROSYN) 500 MG tablet Take 1 tablet (500 mg total) by mouth daily as needed.  . nebivolol (BYSTOLIC) 5 MG tablet Take 1 tablet (5 mg total) by mouth daily.  . SUMAtriptan (IMITREX) 100 MG tablet Take 1 tablet (100 mg total) by mouth daily as needed.  . SUMAtriptan Succinate 4 MG/0.5ML SOAJ Inject 4 mg into the skin  daily as needed.  . zolpidem (AMBIEN) 10 MG tablet Take 1 tablet (10 mg total) by mouth at bedtime as needed for sleep.  . [DISCONTINUED] Ferrous Sulfate (IRON) 325 (65 Fe) MG TABS Take 1 tablet by mouth daily.    PHQ 2/9 Scores 12/05/2017 07/04/2017 02/12/2017 02/09/2016  PHQ - 2 Score 4 0 0 0  PHQ- 9 Score 13 6 0 -    Physical Exam  Constitutional: She is oriented to person, place, and time. She appears  well-developed. She is irritable. No distress.  HENT:  Head: Normocephalic and atraumatic.  Neck: Normal range of motion. Neck supple.  Cardiovascular: Normal rate, regular rhythm and normal heart sounds.  Pulmonary/Chest: Effort normal and breath sounds normal. No respiratory distress.  Musculoskeletal: Normal range of motion.  Lymphadenopathy:    She has no cervical adenopathy.  Neurological: She is alert and oriented to person, place, and time.  Skin: Skin is warm and dry. No rash noted.  Psychiatric: Her speech is normal and behavior is normal. Judgment and thought content normal. Cognition and memory are normal. She exhibits a depressed mood. She expresses no suicidal ideation. She expresses no suicidal plans.  Nursing note and vitals reviewed.   BP 122/78   Pulse 76   Resp 16   Ht 5' 4.5" (1.638 m)   Wt 199 lb (90.3 kg)   SpO2 99%   BMI 33.63 kg/m   Assessment and Plan: 1. Vertigo Take dramamine at bedtime claritin for congestion May need ENT evaluation  2. Current severe episode of major depressive disorder without psychotic features without prior episode (HCC) Stop Cymbalta and begin prozac - 20 mg and increase to 40 after one week if needed - FLUoxetine (PROZAC) 20 MG tablet; Take 1-2 tablets (20-40 mg total) by mouth daily.  Dispense: 60 tablet; Refill: 1  3. Sinus congestion Claritin and Flonase   Partially dictated using Animal nutritionist. Any errors are unintentional.  Bari Edward, MD [facility]  12/05/2017

## 2018-01-04 ENCOUNTER — Other Ambulatory Visit: Payer: Self-pay | Admitting: Internal Medicine

## 2018-01-04 DIAGNOSIS — I10 Essential (primary) hypertension: Secondary | ICD-10-CM

## 2018-01-05 ENCOUNTER — Ambulatory Visit: Payer: BLUE CROSS/BLUE SHIELD | Admitting: Internal Medicine

## 2018-01-05 ENCOUNTER — Other Ambulatory Visit: Payer: Self-pay

## 2018-01-05 DIAGNOSIS — I10 Essential (primary) hypertension: Secondary | ICD-10-CM

## 2018-01-05 MED ORDER — LOSARTAN POTASSIUM-HCTZ 100-12.5 MG PO TABS: 1.0000 | ORAL_TABLET | Freq: Every day | ORAL | 1 refills | Status: DC

## 2018-01-15 ENCOUNTER — Ambulatory Visit: Payer: BLUE CROSS/BLUE SHIELD | Admitting: Internal Medicine

## 2018-01-16 ENCOUNTER — Ambulatory Visit: Payer: BLUE CROSS/BLUE SHIELD | Admitting: Internal Medicine

## 2018-01-27 ENCOUNTER — Encounter: Payer: Self-pay | Admitting: Internal Medicine

## 2018-03-05 ENCOUNTER — Other Ambulatory Visit: Payer: Self-pay | Admitting: Internal Medicine

## 2018-03-05 DIAGNOSIS — F5101 Primary insomnia: Secondary | ICD-10-CM

## 2018-03-06 ENCOUNTER — Other Ambulatory Visit: Payer: Self-pay | Admitting: Internal Medicine

## 2018-03-06 DIAGNOSIS — F5101 Primary insomnia: Secondary | ICD-10-CM

## 2018-03-06 DIAGNOSIS — F324 Major depressive disorder, single episode, in partial remission: Secondary | ICD-10-CM

## 2018-03-06 MED ORDER — ZOLPIDEM TARTRATE 10 MG PO TABS: 10.0000 mg | ORAL_TABLET | Freq: Every evening | ORAL | 5 refills | Status: DC | PRN

## 2018-03-09 ENCOUNTER — Other Ambulatory Visit: Payer: Self-pay

## 2018-03-09 DIAGNOSIS — I10 Essential (primary) hypertension: Secondary | ICD-10-CM

## 2018-03-09 MED ORDER — NEBIVOLOL HCL 5 MG PO TABS: 5.0000 mg | ORAL_TABLET | Freq: Every day | ORAL | 1 refills | Status: DC

## 2018-03-11 ENCOUNTER — Ambulatory Visit: Payer: BLUE CROSS/BLUE SHIELD | Admitting: Certified Nurse Midwife

## 2018-03-11 ENCOUNTER — Encounter

## 2018-03-11 ENCOUNTER — Encounter: Payer: Self-pay | Admitting: Certified Nurse Midwife

## 2018-03-11 VITALS — BP 133/86 | HR 70 | Ht 64.5 in | Wt 202.2 lb

## 2018-03-11 DIAGNOSIS — R1032 Left lower quadrant pain: Secondary | ICD-10-CM | POA: Diagnosis not present

## 2018-03-11 NOTE — Progress Notes (Addendum)
GYN ENCOUNTER NOTE  Subjective:       [name] is a 63 y.o. [id number] female is here for gynecologic evaluation of the following issues:  1. Lower left quadrant pain x 3 wks. .  She states is feel like ovarian pain. She describes it as a stabing pain that she really notices at night when she rolls over. The pain is 8/10 on pain scale when it occurs. She had a hysterectomy but still has her ovaries. She states that before her hysterectomy she always had pain on that side. She also mentioned that after her surgery she had pain on that side that eventually resolved. She admits to having a laparoscopic procedure prior to the hysterectomy for evaluation of endometriosis. Which nothing was found.    Gynecologic History No LMP recorded. Patient has had a hysterectomy. Contraception: menopause Last Pap: n/a. Last mammogram: . Results were: normal/  Obstetric History/ OB History  Gravida Para Term Preterm AB Living  1 1 1     1   SAB TAB Ectopic Multiple Live Births          1    # Outcome Date GA Lbr Len/2nd Weight Sex Delivery Anes PTL Lv  1 Term 1978   8 lb 7 oz (3.827 kg) F Vag-Spont  N LIV    Past Medical History:  Diagnosis Date  . Depression   . Hypertension     Past Surgical History:  Procedure Laterality Date  . ABDOMINAL HYSTERECTOMY  2008   for Menorrhagia  . BREAST REDUCTION SURGERY    . LAPAROSCOPY      Current Outpatient Medications on File Prior to Visit  Medication Sig Dispense Refill  . cholecalciferol (VITAMIN D) 1000 units tablet Take 2,000 Units by mouth daily.    . DULoxetine (CYMBALTA) 20 MG capsule Take 20 mg by mouth daily.    . DULoxetine (CYMBALTA) 60 MG capsule Take 60 mg by mouth daily.    Marland Kitchen. losartan-hydrochlorothiazide (HYZAAR) 100-12.5 MG tablet Take 1 tablet by mouth daily. 90 tablet 1  . naproxen (NAPROSYN) 500 MG tablet Take 1 tablet (500 mg total) by mouth daily as needed. 90 tablet 3  . nebivolol (BYSTOLIC) 5 MG tablet Take 1 tablet (5 mg  total) by mouth daily. 90 tablet 1  . SUMAtriptan (IMITREX) 100 MG tablet Take 1 tablet (100 mg total) by mouth daily as needed. 30 tablet 3  . SUMAtriptan Succinate 4 MG/0.5ML SOAJ Inject 4 mg into the skin daily as needed. 6 Cartridge 5  . zolpidem (AMBIEN) 10 MG tablet Take 1 tablet (10 mg total) by mouth at bedtime as needed for sleep. 30 tablet 5   No current facility-administered medications on file prior to visit.     Allergies  Allergen Reactions  . Amlodipine Swelling    And headache    Social History   Socioeconomic History  . Marital status: Widowed    Spouse name: Not on file  . Number of children: Not on file  . Years of education: Not on file  . Highest education level: Not on file  Occupational History  . Not on file  Social Needs  . Financial resource strain: Not on file  . Food insecurity:    Worry: Not on file    Inability: Not on file  . Transportation needs:    Medical: Not on file    Non-medical: Not on file  Tobacco Use  . Smoking status: Never Smoker  . Smokeless tobacco: Never  Used  Substance and Sexual Activity  . Alcohol use: Not Currently    Comment: wine 2x a week  . Drug use: No  . Sexual activity: Not Currently    Birth control/protection: Surgical  Lifestyle  . Physical activity:    Days per week: Not on file    Minutes per session: Not on file  . Stress: Not on file  Relationships  . Social connections:    Talks on phone: Not on file    Gets together: Not on file    Attends religious service: Not on file    Active member of club or organization: Not on file    Attends meetings of clubs or organizations: Not on file    Relationship status: Not on file  . Intimate partner violence:    Fear of current or ex partner: Not on file    Emotionally abused: Not on file    Physically abused: Not on file    Forced sexual activity: Not on file  Other Topics Concern  . Not on file  Social History Narrative  . Not on file    Family  History  Problem Relation Age of Onset  . Diabetes Daughter   . Hypertension Daughter     The following portions of the patient's history were reviewed and updated as appropriate: allergies, current medications, past family history, past medical history, past social history, past surgical history and problem list.  Review of Systems Review of Systems - Negative except as mentioned in HPI Review of Systems - General ROS: negative for - chills, fatigue, fever, hot flashes, malaise or night sweats Hematological and Lymphatic ROS: negative for - bleeding problems or swollen lymph nodes Gastrointestinal ROS: negative for - abdominal pain, blood in stools, change in bowel habits and nausea/vomiting Musculoskeletal ROS: negative for - joint pain, muscle pain or muscular weakness Genito-Urinary ROS: negative for - change in menstrual cycle, dysmenorrhea, dyspareunia, dysuria, genital discharge, genital ulcers, hematuria, incontinence, irregular/heavy menses, nocturia. Positive for left lower quadrant pelvic pain x 3 wks   Objective:   BP 133/86   Pulse 70   Ht 5' 4.5" (1.638 m)   Wt 202 lb 4 oz (91.7 kg)   BMI 34.18 kg/m  CONSTITUTIONAL: Well-developed, well-nourished female in no acute distress.  HENT:  Normocephalic, atraumatic.  NECK: Normal range of motion, supple, no masses.  Normal thyroid.  SKIN: Skin is warm and dry. No rash noted. Not diaphoretic. No erythema. No pallor. NEUROLGIC: Alert and oriented to person, place, and time. PSYCHIATRIC: Normal mood and affect. Normal behavior. Normal judgment and thought content. CARDIOVASCULAR:Not Examined RESPIRATORY: Not Examined BREASTS: Not Examined ABDOMEN: Soft, non distended; Non tender.  No Organomegaly. PELVIC:  External Genitalia: Normal  BUS: Normal  Vagina: Normal, white discharge no odor  Cervix: absent  Uterus: absent  Adnexa: Normal  RV: Normal   Bladder: Nontender MUSCULOSKELETAL: Normal range of motion. No tenderness.   No cyanosis, clubbing, or edema.  Assessment:   Left lower quadrant pain    Plan:   Discussed possibility of ovarian cyst or adhesion from previous surgery. U/s ordered. Pt to follow for ultrasound at earliest convenience. Will follow up with results.   Doreene Burke, CNM

## 2018-03-11 NOTE — Patient Instructions (Signed)
Pelvic Pain, Female  Pelvic pain is pain in your lower belly (abdomen), below your belly button and between your hips. The pain may start suddenly (be acute), keep coming back (be recurring), or last a long time (become chronic). Pelvic pain that lasts longer than 6 months is called chronic pelvic pain. There are many causes of pelvic pain. Sometimes the cause of pelvic pain is not known.  Follow these instructions at home:    · Take over-the-counter and prescription medicines only as told by your doctor.  · Rest as told by your doctor.  · Do not have sex if it hurts.  · Keep a journal of your pelvic pain. Write down:  ? When the pain started.  ? Where the pain is located.  ? What seems to make the pain better or worse, such as food or your period (menstrual cycle).  ? Any symptoms you have along with the pain.  · Keep all follow-up visits as told by your doctor. This is important.  Contact a doctor if:  · Medicine does not help your pain.  · Your pain comes back.  · You have new symptoms.  · You have unusual discharge or bleeding from your vagina.  · You have a fever or chills.  · You are having trouble pooping (constipation).  · You have blood in your pee (urine) or poop (stool).  · Your pee smells bad.  · You feel weak or light-headed.  Get help right away if:  · You have sudden pain that is very bad.  · Your pain keeps getting worse.  · You have very bad pain and also have any of these symptoms:  ? A fever.  ? Feeling sick to your stomach (nausea).  ? Throwing up (vomiting).  ? Being very sweaty.  · You pass out (lose consciousness).  Summary  · Pelvic pain is pain in your lower belly (abdomen), below your belly button and between your hips.  · There are many possible causes of pelvic pain.  · Keep a journal of your pelvic pain.  This information is not intended to replace advice given to you by your health care provider. Make sure you discuss any questions you have with your health care provider.  Document  Released: 07/03/2007 Document Revised: 07/02/2017 Document Reviewed: 07/02/2017  Elsevier Interactive Patient Education © 2019 [employer]

## 2018-03-18 ENCOUNTER — Encounter: Payer: BLUE CROSS/BLUE SHIELD | Admitting: Obstetrics and Gynecology

## 2018-03-19 ENCOUNTER — Other Ambulatory Visit: Payer: BLUE CROSS/BLUE SHIELD

## 2018-04-10 ENCOUNTER — Other Ambulatory Visit: Payer: Self-pay | Admitting: Internal Medicine

## 2018-05-20 ENCOUNTER — Ambulatory Visit (INDEPENDENT_AMBULATORY_CARE_PROVIDER_SITE_OTHER): Payer: BLUE CROSS/BLUE SHIELD

## 2018-05-20 ENCOUNTER — Encounter: Payer: Self-pay | Admitting: Emergency Medicine

## 2018-05-20 ENCOUNTER — Ambulatory Visit
Admission: EM | Admit: 2018-05-20 | Discharge: 2018-05-20 | Disposition: A | Discharge status: Home / Self Care | Payer: BLUE CROSS/BLUE SHIELD | Attending: Family Medicine | Admitting: Family Medicine

## 2018-05-20 ENCOUNTER — Other Ambulatory Visit: Payer: Self-pay

## 2018-05-20 DIAGNOSIS — S92002A Unspecified fracture of left calcaneus, initial encounter for closed fracture: Secondary | ICD-10-CM

## 2018-05-20 DIAGNOSIS — M25572 Pain in left ankle and joints of left foot: Secondary | ICD-10-CM

## 2018-05-20 DIAGNOSIS — W1789XA Other fall from one level to another, initial encounter: Secondary | ICD-10-CM

## 2018-05-20 MED ORDER — MELOXICAM 15 MG PO TABS: 15.0000 mg | ORAL_TABLET | Freq: Every day | ORAL | 0 refills | Status: AC

## 2018-05-20 MED ORDER — HYDROCODONE-ACETAMINOPHEN 5-325 MG PO TABS: 1.0000 | ORAL_TABLET | Freq: Three times a day (TID) | ORAL | 0 refills | Status: DC | PRN

## 2018-05-20 NOTE — Discharge Instructions (Addendum)
Rest, ice, elevate.  No ambulation.  Medication as prescribed.  Call [facility] in the AM.

## 2018-05-20 NOTE — ED Triage Notes (Signed)
Patient states she fell off the back of a truck today and injured her left ankle. Her left ankle twisted. Patient is unable to bear weight on her left ankle.

## 2018-05-20 NOTE — ED Provider Notes (Signed)
[facility]    CSN: [id number] Arrival date & time: 05/20/18  1540  History   Chief Complaint Chief Complaint  Patient presents with  . Ankle Injury   HPI  63 year old female presents with left ankle pain.  Patient states that she was helping a neighbor today.  She was removing a large limb/branch from the back of a truck.  It subsequently gave way and she fell out of the back end of the truck.  She states that when she fell she believes that she twisted her left ankle.  She reports diffuse ankle pain but more so laterally.  Associated swelling.  Patient states he is unable to bear weight on the left foot.  No medications or interventions tried.  Exacerbated by activity.  No relieving factors.  Pain currently 10/10 in severity and described as sharp.  No other complaints.  History reviewed and updated as below. PMH: Patient Active Problem List   Diagnosis Date Noted  . Mixed hyperlipidemia 07/15/2017  . Pre-diabetes 04/18/2017  . Vitamin D deficiency 04/18/2017  . Sleep apnea 02/28/2015  . Obesity 02/28/2015  . Routine general medical examination at a health care facility 08/04/2013  . Hemorrhoids 03/04/2012  . Insomnia 03/04/2012  . Arthralgia 03/04/2012  . Migraine without aura or status migrainosus 03/04/2012  . Major depressive disorder, single episode 12/12/2010  . Essential hypertension 11/08/2010   Past Surgical History:  Procedure Laterality Date  . ABDOMINAL HYSTERECTOMY  2008   for Menorrhagia  . BREAST REDUCTION SURGERY    . LAPAROSCOPY      OB History    Gravida  1   Para  1   Term  1   Preterm      AB      Living  1     SAB      TAB      Ectopic      Multiple      Live Births  1          Home Medications    Prior to Admission medications   Medication Sig Start Date End Date Taking? Authorizing Provider  cholecalciferol (VITAMIN D) 1000 units tablet Take 2,000 Units by mouth daily.   Yes [provider]   DULoxetine (CYMBALTA) 20 MG capsule Take 20 mg by mouth daily.   Yes [provider]  DULoxetine (CYMBALTA) 60 MG capsule Take 1 capsule by mouth daily 04/10/18  Yes Reubin MilanBerglund, Laura H, MD  losartan-hydrochlorothiazide (HYZAAR) 100-12.5 MG tablet Take 1 tablet by mouth daily. 01/05/18  Yes Reubin MilanBerglund, Laura H, MD  nebivolol (BYSTOLIC) 5 MG tablet Take 1 tablet (5 mg total) by mouth daily. 03/09/18  Yes Reubin MilanBerglund, Laura H, MD  SUMAtriptan (IMITREX) 100 MG tablet Take 1 tablet (100 mg total) by mouth daily as needed. 04/18/17  Yes Reubin MilanBerglund, Laura H, MD  SUMAtriptan Succinate 4 MG/0.5ML SOAJ Inject 4 mg into the skin daily as needed. 07/04/17  Yes Reubin MilanBerglund, Laura H, MD  zolpidem (AMBIEN) 10 MG tablet Take 1 tablet (10 mg total) by mouth at bedtime as needed for sleep. 03/06/18  Yes Reubin MilanBerglund, Laura H, MD  HYDROcodone-acetaminophen (NORCO/VICODIN) 5-325 MG tablet Take 1 tablet by mouth every 8 (eight) hours as needed for moderate pain or severe pain. 05/20/18   Tommie Samsook, Aaima Gaddie G, DO  meloxicam (MOBIC) 15 MG tablet Take 1 tablet (15 mg total) by mouth daily for 7 days. 05/20/18 05/27/18  Tommie Samsook, Charmaine Placido G, DO    Family History Family History  Problem Relation Age of Onset  . Diabetes Daughter   . Hypertension Daughter     Social History Social History   Tobacco Use  . Smoking status: Never Smoker  . Smokeless tobacco: Never Used  Substance Use Topics  . Alcohol use: Not Currently    Comment: wine 2x a week  . Drug use: No     Allergies   Amlodipine   Review of Systems Review of Systems  Constitutional: Negative.   Musculoskeletal:       Right ankle pain and swelling. Cannot bear weight.   Physical Exam Triage Vital Signs ED Triage Vitals [05/20/18 1552]  Enc Vitals Group     BP (!) 158/60     Pulse Rate 86     Resp 18     Temp 98.1 F (36.7 C)     Temp Source Oral     SpO2 97 %     Weight 200 lb (90.7 kg)     Height  (1.626 m)     Head Circumference      Peak Flow      Pain  Score 10     Pain Loc      Pain Edu?      Excl. in GC?    No data found.  Updated Vital Signs BP (!) 158/60 (BP Location: Left Arm)   Pulse 86   Temp 98.1 F (36.7 C) (Oral)   Resp 18   Ht  (1.626 m)   Wt 90.7 kg   SpO2 97%   BMI 34.33 kg/m   Visual Acuity Right Eye Distance:   Left Eye Distance:   Bilateral Distance:    Right Eye Near:   Left Eye Near:    Bilateral Near:     Physical Exam Vitals signs and nursing note reviewed.  Constitutional:      General: She is not in acute distress.    Appearance: Normal appearance. She is obese.  HENT:     Head: Normocephalic and atraumatic.  Eyes:     General:        Right eye: No discharge.        Left eye: No discharge.     Conjunctiva/sclera: Conjunctivae normal.  Cardiovascular:     Rate and Rhythm: Normal rate and regular rhythm.  Pulmonary:     Effort: Pulmonary effort is normal.     Breath sounds: Normal breath sounds.  Musculoskeletal:     Comments: Left foot and ankle - No discrete areas of tenderness of the left foot.  Patient with swelling around lateral malleolus.  Exquisitely tender to palpation.  Achilles intact.  No apparent bruising.  Neurological:     Mental Status: She is alert.  Psychiatric:        Mood and Affect: Mood normal.        Behavior: Behavior normal.    UC Treatments / Results  Labs (all labs ordered are listed, but only abnormal results are displayed) Labs Reviewed - No data to display  EKG None  Radiology Dg Ankle Complete Left  Result Date: 05/20/2018 CLINICAL DATA:  Fall with inversion injury EXAM: LEFT ANKLE COMPLETE - 3+ VIEW COMPARISON:  None. FINDINGS: Ankle mortise is intact. Subtle irregular lucencies through the mid calcaneus bone. IMPRESSION: Findings suspicious for acute nondisplaced mildly comminuted calcaneus fracture. CT is suggested for further evaluation. Electronically Signed   By: Jasmine Pang M.D.   On: 05/20/2018 16:14    Procedures Procedures  (including  critical care time)  Medications Ordered in UC Medications - No data to display  Initial Impression / Assessment and Plan / UC Course  I have reviewed the triage vital signs and the nursing notes.  Pertinent labs & imaging results that were available during my care of the patient were reviewed by me and considered in my medical decision making (see chart for details).    63 year old female presents with suspected comminuted fracture of the calcaneus.  CT was recommended.  I am deferring this to podiatry.  Advised no weightbearing.  Rest, ice, elevation.  Meloxicam as directed.  Vicodin as needed for pain.  [place] controlled substance database reviewed.  No concerns for abuse.  Patient is to follow-up with podiatry.  She is to call Dr. Racheal Patches office tomorrow.  Final Clinical Impressions(s) / UC Diagnoses   Final diagnoses:  Closed nondisplaced fracture of left calcaneus, unspecified portion of calcaneus, initial encounter     Discharge Instructions     Rest, ice, elevate.  No ambulation.  Medication as prescribed.  Call [facility] in the AM.     ED Prescriptions    Medication Sig Dispense Auth. Provider   meloxicam (MOBIC) 15 MG tablet Take 1 tablet (15 mg total) by mouth daily for 7 days. 7 tablet Moataz Tavis G, DO   HYDROcodone-acetaminophen (NORCO/VICODIN) 5-325 MG tablet Take 1 tablet by mouth every 8 (eight) hours as needed for moderate pain or severe pain. 10 tablet Tommie Sams, DO     Controlled Substance Prescriptions [place] Controlled Substance Registry consulted? Yes, I have consulted the [place] Controlled Substances Registry for this patient, and feel the risk/benefit ratio today is favorable for proceeding with this prescription for a controlled substance.   Tommie Sams, [place] 05/20/18 1635

## 2018-05-21 ENCOUNTER — Other Ambulatory Visit: Payer: Self-pay | Admitting: Podiatry

## 2018-05-21 DIAGNOSIS — S92015D Nondisplaced fracture of body of left calcaneus, subsequent encounter for fracture with routine healing: Secondary | ICD-10-CM

## 2018-05-26 ENCOUNTER — Other Ambulatory Visit: Payer: Self-pay

## 2018-05-26 ENCOUNTER — Ambulatory Visit
Admission: RE | Admit: 2018-05-26 | Discharge: 2018-05-26 | Disposition: A | Discharge status: Home / Self Care | Payer: BLUE CROSS/BLUE SHIELD | Source: Ambulatory Visit | Attending: Podiatry | Admitting: Podiatry

## 2018-05-26 DIAGNOSIS — S92015D Nondisplaced fracture of body of left calcaneus, subsequent encounter for fracture with routine healing: Secondary | ICD-10-CM | POA: Diagnosis present

## 2018-07-13 ENCOUNTER — Encounter: Payer: BLUE CROSS/BLUE SHIELD | Admitting: Internal Medicine

## 2018-08-05 ENCOUNTER — Other Ambulatory Visit: Payer: Self-pay | Admitting: Internal Medicine

## 2018-09-11 ENCOUNTER — Other Ambulatory Visit: Payer: Self-pay | Admitting: Internal Medicine

## 2018-09-11 DIAGNOSIS — I10 Essential (primary) hypertension: Secondary | ICD-10-CM

## 2018-09-22 ENCOUNTER — Other Ambulatory Visit: Payer: Self-pay | Admitting: Internal Medicine

## 2018-10-05 ENCOUNTER — Other Ambulatory Visit: Payer: Self-pay | Admitting: Internal Medicine

## 2018-10-05 DIAGNOSIS — F5101 Primary insomnia: Secondary | ICD-10-CM

## 2018-12-02 ENCOUNTER — Ambulatory Visit (INDEPENDENT_AMBULATORY_CARE_PROVIDER_SITE_OTHER): Payer: BLUE CROSS/BLUE SHIELD | Admitting: Internal Medicine

## 2018-12-02 ENCOUNTER — Encounter: Payer: Self-pay | Admitting: Internal Medicine

## 2018-12-02 ENCOUNTER — Other Ambulatory Visit: Payer: Self-pay

## 2018-12-02 VITALS — BP 112/64 | HR 73 | Ht 64.5 in | Wt 198.0 lb

## 2018-12-02 DIAGNOSIS — R7303 Prediabetes: Secondary | ICD-10-CM

## 2018-12-02 DIAGNOSIS — Z13820 Encounter for screening for osteoporosis: Secondary | ICD-10-CM

## 2018-12-02 DIAGNOSIS — I10 Essential (primary) hypertension: Secondary | ICD-10-CM

## 2018-12-02 DIAGNOSIS — Z0000 Encounter for general adult medical examination without abnormal findings: Secondary | ICD-10-CM | POA: Diagnosis not present

## 2018-12-02 DIAGNOSIS — Z23 Encounter for immunization: Secondary | ICD-10-CM | POA: Diagnosis not present

## 2018-12-02 DIAGNOSIS — Z1231 Encounter for screening mammogram for malignant neoplasm of breast: Secondary | ICD-10-CM | POA: Diagnosis not present

## 2018-12-02 DIAGNOSIS — F325 Major depressive disorder, single episode, in full remission: Secondary | ICD-10-CM

## 2018-12-02 DIAGNOSIS — G43009 Migraine without aura, not intractable, without status migrainosus: Secondary | ICD-10-CM | POA: Diagnosis not present

## 2018-12-02 DIAGNOSIS — M19172 Post-traumatic osteoarthritis, left ankle and foot: Secondary | ICD-10-CM

## 2018-12-02 LAB — POCT URINALYSIS DIPSTICK
Bilirubin, UA: NEGATIVE
Blood, UA: NEGATIVE
Glucose, UA: NEGATIVE
Ketones, UA: NEGATIVE
Leukocytes, UA: NEGATIVE
Nitrite, UA: NEGATIVE
Protein, UA: NEGATIVE
Spec Grav, UA: 1.015 (ref 1.010–1.025)
Urobilinogen, UA: 0.2 E.U./dL
pH, UA: 7 (ref 5.0–8.0)

## 2018-12-02 MED ORDER — NAPROXEN 500 MG PO TABS: 500.0000 mg | ORAL_TABLET | Freq: Two times a day (BID) | ORAL | 1 refills | Status: DC

## 2018-12-02 MED ORDER — DULOXETINE HCL 60 MG PO CPEP: 60.0000 mg | ORAL_CAPSULE | Freq: Every day | ORAL | 1 refills | Status: DC

## 2018-12-02 MED ORDER — SHINGRIX 50 MCG/0.5ML IM SUSR: 0.5000 mL | Freq: Once | INTRAMUSCULAR | 1 refills | Status: DC

## 2018-12-02 MED ORDER — NEBIVOLOL HCL 5 MG PO TABS: ORAL_TABLET | ORAL | 1 refills | Status: DC

## 2018-12-02 MED ORDER — LOSARTAN POTASSIUM-HCTZ 100-12.5 MG PO TABS: 1.0000 | ORAL_TABLET | Freq: Every day | ORAL | 1 refills | Status: DC

## 2018-12-02 NOTE — Progress Notes (Signed)
Date:  12/02/2018   Name:  [name]   DOB:  [birth year]   MRN:  [id number]   Chief Complaint: Annual Exam (Breast Exam- no pap.) [name] is a 63 y.o. female who presents today for her Complete Annual Exam. She feels fairly well. She reports exercising little due to foot fx. She reports she is sleeping fairly well. She broke her ankle and heel about 6 months ago.  Mammogram  01/2018 Pap smear - discontinued Colonoscopy  03/2012  Ankle and heel fracture - healing slowly.  Being followed by Podiatry.  Did not need surgery but has had several cortisone injections to help with pain.  She does not take calcium or vitamin D.  She has not had a DEXA. Hypertension This is a chronic problem. The problem is controlled. Pertinent negatives include no chest pain, headaches, palpitations or shortness of breath. Past treatments include diuretics, beta blockers and ACE inhibitors. The current treatment provides significant improvement.  Hyperlipidemia This is a chronic problem. The problem is uncontrolled. Pertinent negatives include no chest pain or shortness of breath. Current antihyperlipidemic treatment includes exercise and diet change.  Diabetes She presents for her follow-up diabetic visit. Diabetes type: pre-diabetes. Her disease course has been stable. Pertinent negatives for hypoglycemia include no dizziness, headaches, nervousness/anxiousness or tremors. Pertinent negatives for diabetes include no chest pain, no fatigue, no polydipsia and no polyuria.  Migraine  This is a recurrent problem. The problem has been rapidly improving (only had 2 in the past 2 years). The pain quality is similar to prior headaches. Pertinent negatives include no abdominal pain, coughing, dizziness, fever, hearing loss, tinnitus or vomiting. Her past medical history is significant for hypertension.   Lab Results  Component Value Date   HGBA1C 6.0 (H) 07/14/2017   Lab Results  Component Value Date   CREATININE 0.61 07/14/2017   BUN 13 07/14/2017   NA 141 07/14/2017   K 4.4 07/14/2017   CL 101 07/14/2017   CO2 26 07/14/2017   Lab Results  Component Value Date   CHOL 229 (H) 07/14/2017   HDL 44 07/14/2017   LDLCALC 148 (H) 07/14/2017   LDLDIRECT 161.1 11/08/2010   TRIG 183 (H) 07/14/2017   CHOLHDL 5.2 (H) 07/14/2017     Review of Systems  Constitutional: Negative for chills, fatigue and fever.  HENT: Negative for congestion, hearing loss, tinnitus, trouble swallowing and voice change.   Eyes: Negative for visual disturbance.  Respiratory: Negative for cough, chest tightness, shortness of breath and wheezing.   Cardiovascular: Negative for chest pain, palpitations and leg swelling.  Gastrointestinal: Negative for abdominal pain, constipation, diarrhea and vomiting.  Endocrine: Negative for polydipsia and polyuria.  Genitourinary: Negative for dysuria, frequency, genital sores, vaginal bleeding and vaginal discharge.  Musculoskeletal: Positive for arthralgias (left ankle pain). Negative for gait problem and joint swelling.  Skin: Negative for color change and rash.  Neurological: Negative for dizziness, tremors, light-headedness and headaches.  Hematological: Negative for adenopathy. Does not bruise/bleed easily.  Psychiatric/Behavioral: Positive for dysphoric mood. Negative for sleep disturbance. The patient is not nervous/anxious.     Patient Active Problem List   Diagnosis Date Noted  . Major depressive disorder with single episode, in full remission ([place]) 12/02/2018  . Mixed hyperlipidemia 07/15/2017  . Pre-diabetes 04/18/2017  . Vitamin D deficiency 04/18/2017  . Sleep apnea 02/28/2015  . Obesity 02/28/2015  . Routine general medical examination at a health care facility 08/04/2013  . Hemorrhoids 03/04/2012  .  Insomnia 03/04/2012  . Arthralgia 03/04/2012  . Migraine without aura or status migrainosus 03/04/2012  . Essential hypertension 11/08/2010    Allergies   Allergen Reactions  . Amlodipine Swelling    And headache    Past Surgical History:  Procedure Laterality Date  . ABDOMINAL HYSTERECTOMY  2008   for Menorrhagia  . BREAST REDUCTION SURGERY    . LAPAROSCOPY      Social History   Tobacco Use  . Smoking status: Never Smoker  . Smokeless tobacco: Never Used  Substance Use Topics  . Alcohol use: Not Currently    Comment: wine 2x a week  . Drug use: No     Medication list has been reviewed and updated.  Current Meds  Medication Sig  . BYSTOLIC 5 MG tablet Take 1 tablet (5 mg total) by mouth daily.  . cholecalciferol (VITAMIN D) 1000 units tablet Take 2,000 Units by mouth daily.  . DULoxetine (CYMBALTA) 60 MG capsule Take 1 capsule by mouth daily  . losartan-hydrochlorothiazide (HYZAAR) 100-12.5 MG tablet Take 1 tablet by mouth daily.  . naproxen (NAPROSYN) 500 MG tablet Take 500 mg by mouth 2 (two) times daily with a meal.  . SUMAtriptan (IMITREX) 100 MG tablet Take 1 tablet (100 mg total) by mouth daily as needed.  . SUMAtriptan Succinate 4 MG/0.5ML SOAJ Inject 4 mg into the skin daily as needed.  . zolpidem (AMBIEN) 10 MG tablet Take 1 tablet (10 mg total) by mouth at bedtime as needed for sleep.    PHQ 2/9 Scores 12/02/2018 12/05/2017 07/04/2017 02/12/2017  PHQ - 2 Score 2 4 0 0  PHQ- 9 Score 8 13 6  0    BP Readings from Last 3 Encounters:  12/02/18 112/64  05/20/18 (!) 158/60  03/11/18 133/86    Physical Exam Vitals signs and nursing note reviewed.  Constitutional:      General: She is not in acute distress.    Appearance: She is well-developed.  HENT:     Head: Normocephalic and atraumatic.     Right Ear: Tympanic membrane and ear canal normal.     Left Ear: Tympanic membrane and ear canal normal.     Nose:     Right Sinus: No maxillary sinus tenderness.     Left Sinus: No maxillary sinus tenderness.  Eyes:     General: No scleral icterus.       Right eye: No discharge.        Left eye: No discharge.      Conjunctiva/sclera: Conjunctivae normal.  Neck:     Musculoskeletal: Normal range of motion. No erythema.     Thyroid: No thyromegaly.     Vascular: No carotid bruit.  Cardiovascular:     Rate and Rhythm: Normal rate and regular rhythm.     Pulses: Normal pulses.     Heart sounds: Normal heart sounds.  Pulmonary:     Effort: Pulmonary effort is normal. No respiratory distress.     Breath sounds: No wheezing.  Chest:     Breasts:        Right: No mass, nipple discharge, skin change or tenderness.        Left: No mass, nipple discharge, skin change or tenderness.  Abdominal:     General: Bowel sounds are normal.     Palpations: Abdomen is soft.     Tenderness: There is no abdominal tenderness.  Musculoskeletal: Normal range of motion.  Lymphadenopathy:     Cervical: No cervical adenopathy.  Skin:    General: Skin is warm and dry.     Findings: No rash.  Neurological:     Mental Status: She is alert and oriented to person, place, and time.     Cranial Nerves: No cranial nerve deficit.     Sensory: No sensory deficit.     Deep Tendon Reflexes: Reflexes are normal and symmetric.  Psychiatric:        Speech: Speech normal.        Behavior: Behavior normal.        Thought Content: Thought content normal.     Wt Readings from Last 3 Encounters:  12/02/18 198 lb (89.8 kg)  05/20/18 200 lb (90.7 kg)  03/11/18 202 lb 4 oz (91.7 kg)    BP 112/64   Pulse 73   Ht 5' 4.5" (1.638 m)   Wt 198 lb (89.8 kg)   SpO2 97%   BMI 33.46 kg/m   Assessment and Plan: 1. Annual physical exam Normal exam except for weight - Lipid panel - POCT urinalysis dipstick  2. Encounter for screening mammogram for breast cancer [facility]  3. Essential hypertension Clinically stable exam with well controlled BP.   Tolerating medications, bystolic, lisinopril hct, without side effects at this time. Pt to continue current regimen and low sodium diet; benefits of regular  exercise as able discussed. - CBC with Differential/Platelet - Comprehensive metabolic panel - losartan-hydrochlorothiazide (HYZAAR) 100-12.5 MG tablet; Take 1 tablet by mouth daily.  Dispense: 90 tablet; Refill: 1 - nebivolol (BYSTOLIC) 5 MG tablet; Take 1 tablet (5 mg total) by mouth daily.  Dispense: 90 tablet; Refill: 1  4. Migraine without aura and without status migrainosus, not intractable Mostly resolved  5. Pre-diabetes Continue to limit carbs, will check labs - Hemoglobin A1c  6. Major depressive disorder with single episode, in full remission (HCC) Clinically stable and doing well on current medications.  No SI/HI.  Enjoying retirement and staying home. - TSH - DULoxetine (CYMBALTA) 60 MG capsule; Take 1 capsule (60 mg total) by mouth daily.  Dispense: 90 capsule; Refill: 1  7. Need for shingles vaccine First dose today - Varicella-zoster vaccine IM  8. Encounter for screening for osteoporosis Will schedule and then advise on calcium and vitamin D supplement - DG Bone Density; Future  9. Post-traumatic osteoarthritis of left foot Healing slowly - continue podiatry care   Partially dictated using Dragon software. Any errors are unintentional.  Bari EdwardLaura Madden Piazza, MD [facility]  12/02/2018

## 2018-12-02 NOTE — Patient Instructions (Signed)
Calcium 1200 mg and Vitamin D 400 IU - daily 

## 2018-12-03 LAB — COMPREHENSIVE METABOLIC PANEL
ALT: 65 IU/L — ABNORMAL HIGH (ref 0–32)
AST: 53 IU/L — ABNORMAL HIGH (ref 0–40)
Albumin/Globulin Ratio: 1.7 (ref 1.2–2.2)
Albumin: 4.1 g/dL (ref 3.8–4.8)
Alkaline Phosphatase: 138 IU/L — ABNORMAL HIGH (ref 39–117)
BUN/Creatinine Ratio: 17 (ref 12–28)
BUN: 11 mg/dL (ref 8–27)
Bilirubin Total: 0.5 mg/dL (ref 0.0–1.2)
CO2: 26 mmol/L (ref 20–29)
Calcium: 9.6 mg/dL (ref 8.7–10.3)
Chloride: 100 mmol/L (ref 96–106)
Creatinine, Ser: 0.66 mg/dL (ref 0.57–1.00)
GFR calc Af Amer: 109 mL/min/{1.73_m2} (ref >59)
GFR calc non Af Amer: 95 mL/min/{1.73_m2} (ref >59)
Globulin, Total: 2.4 g/dL (ref 1.5–4.5)
Glucose: 130 mg/dL — ABNORMAL HIGH (ref 65–99)
Potassium: 5.2 mmol/L (ref 3.5–5.2)
Sodium: 141 mmol/L (ref 134–144)
Total Protein: 6.5 g/dL (ref 6.0–8.5)

## 2018-12-03 LAB — LIPID PANEL
Chol/HDL Ratio: 3.8 ratio (ref 0.0–4.4)
Cholesterol, Total: 195 mg/dL (ref 100–199)
HDL: 51 mg/dL (ref >39)
LDL Chol Calc (NIH): 124 mg/dL — ABNORMAL HIGH (ref 0–99)
Triglycerides: 114 mg/dL (ref 0–149)
VLDL Cholesterol Cal: 20 mg/dL (ref 5–40)

## 2018-12-03 LAB — TSH: TSH: 1.63 u[IU]/mL (ref 0.450–4.500)

## 2018-12-03 LAB — CBC WITH DIFFERENTIAL/PLATELET
Basophils Absolute: 0 10*3/uL (ref 0.0–0.2)
Basos: 1 %
EOS (ABSOLUTE): 0.1 10*3/uL (ref 0.0–0.4)
Eos: 1 %
Hematocrit: 43.7 % (ref 34.0–46.6)
Hemoglobin: 14.4 g/dL (ref 11.1–15.9)
Immature Grans (Abs): 0 10*3/uL (ref 0.0–0.1)
Immature Granulocytes: 0 %
Lymphocytes Absolute: 1.4 10*3/uL (ref 0.7–3.1)
Lymphs: 18 %
MCH: 31.5 pg (ref 26.6–33.0)
MCHC: 33 g/dL (ref 31.5–35.7)
MCV: 96 fL (ref 79–97)
Monocytes Absolute: 0.6 10*3/uL (ref 0.1–0.9)
Monocytes: 8 %
Neutrophils Absolute: 5.5 10*3/uL (ref 1.4–7.0)
Neutrophils: 72 %
Platelets: 289 10*3/uL (ref 150–450)
RBC: 4.57 x10E6/uL (ref 3.77–5.28)
RDW: 11.8 % (ref 11.7–15.4)
WBC: 7.6 10*3/uL (ref 3.4–10.8)

## 2018-12-03 LAB — HEMOGLOBIN A1C
Est. average glucose Bld gHb Est-mCnc: 134 mg/dL
Hgb A1c MFr Bld: 6.3 % — ABNORMAL HIGH (ref 4.8–5.6)

## 2019-03-03 ENCOUNTER — Other Ambulatory Visit: Payer: Self-pay

## 2019-03-03 DIAGNOSIS — Z1231 Encounter for screening mammogram for malignant neoplasm of breast: Secondary | ICD-10-CM

## 2019-03-08 ENCOUNTER — Other Ambulatory Visit: Payer: Self-pay

## 2019-03-08 ENCOUNTER — Ambulatory Visit (INDEPENDENT_AMBULATORY_CARE_PROVIDER_SITE_OTHER): Payer: 59

## 2019-03-08 DIAGNOSIS — Z23 Encounter for immunization: Secondary | ICD-10-CM | POA: Diagnosis not present

## 2019-04-26 ENCOUNTER — Encounter: Payer: Self-pay | Admitting: Internal Medicine

## 2019-04-26 ENCOUNTER — Ambulatory Visit
Admission: RE | Admit: 2019-04-26 | Discharge: 2019-04-26 | Disposition: A | Discharge status: Home / Self Care | Payer: 59 | Source: Ambulatory Visit | Attending: Internal Medicine | Admitting: Internal Medicine

## 2019-04-26 DIAGNOSIS — Z1231 Encounter for screening mammogram for malignant neoplasm of breast: Secondary | ICD-10-CM | POA: Diagnosis present

## 2019-04-26 DIAGNOSIS — Z13820 Encounter for screening for osteoporosis: Secondary | ICD-10-CM | POA: Insufficient documentation

## 2019-04-26 DIAGNOSIS — M8580 Other specified disorders of bone density and structure, unspecified site: Secondary | ICD-10-CM | POA: Insufficient documentation

## 2019-06-02 ENCOUNTER — Ambulatory Visit: Payer: BLUE CROSS/BLUE SHIELD | Admitting: Internal Medicine

## 2019-06-14 ENCOUNTER — Other Ambulatory Visit: Payer: Self-pay | Admitting: Internal Medicine

## 2019-06-14 DIAGNOSIS — I10 Essential (primary) hypertension: Secondary | ICD-10-CM

## 2019-06-14 DIAGNOSIS — F5101 Primary insomnia: Secondary | ICD-10-CM

## 2019-06-14 NOTE — Telephone Encounter (Signed)
Requested medication (s) are due for refill today: yes  Requested medication (s) are on the active medication list: yes  Last refill:  10/06/18  Future visit scheduled: yes in November  Notes to clinic:  medication not delegated to NT to RF   Requested Prescriptions  Pending Prescriptions Disp Refills   zolpidem (AMBIEN) 10 MG tablet [Pharmacy Med Name: ZOLPIDEM TARTRATE 10 MG TABLET] 30 tablet     Sig: Take 1 tablet (10 mg total) by mouth at bedtime as needed for sleep.      Not Delegated - Psychiatry:  Anxiolytics/Hypnotics Failed - 06/14/2019  1:16 PM      Failed - This refill cannot be delegated      Failed - Urine Drug Screen completed in last 360 days.      Failed - Valid encounter within last 6 months    Recent Outpatient Visits           6 months ago Annual physical exam   [facility] Glean Hess, MD   1 year ago Vertigo   [facility] Glean Hess, MD   1 year ago Annual physical exam   [facility] Glean Hess, MD   2 years ago Essential hypertension   [facility] Glean Hess, MD   2 years ago Essential hypertension   [facility] Glean Hess, MD       Future Appointments             In 5 months Glean Hess, MD [facility]             Signed Prescriptions Disp Refills   losartan-hydrochlorothiazide (HYZAAR) 100-12.5 MG tablet 90 tablet 0    Sig: Take 1 tablet by mouth daily.      Cardiovascular: ARB + Diuretic Combos Failed - 06/14/2019  1:16 PM      Failed - K in normal range and within 180 days    Potassium  Date Value Ref Range Status  12/02/2018 5.2 3.5 - 5.2 mmol/L Final          Failed - Na in normal range and within 180 days    Sodium  Date Value Ref Range Status  12/02/2018 141 134 - 144 mmol/L Final          Failed - Cr in normal range and within 180 days    Creatinine, Ser  Date Value Ref Range Status  12/02/2018 0.66 0.57 - 1.00  mg/dL Final          Failed - Ca in normal range and within 180 days    Calcium  Date Value Ref Range Status  12/02/2018 9.6 8.7 - 10.3 mg/dL Final          Failed - Valid encounter within last 6 months    Recent Outpatient Visits           6 months ago Annual physical exam   [facility] Glean Hess, MD   1 year ago Vertigo   [facility] Glean Hess, MD   1 year ago Annual physical exam   [facility] Glean Hess, MD   2 years ago Essential hypertension   [facility] Glean Hess, MD   2 years ago Essential hypertension   [place], Laura H, MD       Future Appointments  In 5 months Reubin Milan, MD [facility]            Passed - Patient is not pregnant      Passed - Last BP in normal range    BP Readings from Last 1 Encounters:  12/02/18 112/64

## 2019-06-14 NOTE — Telephone Encounter (Signed)
Requested Prescriptions  Pending Prescriptions Disp Refills  . zolpidem (AMBIEN) 10 MG tablet [Pharmacy Med Name: ZOLPIDEM TARTRATE 10 MG TABLET] 30 tablet     Sig: Take 1 tablet (10 mg total) by mouth at bedtime as needed for sleep.     Not Delegated - Psychiatry:  Anxiolytics/Hypnotics Failed - 06/14/2019  1:16 PM      Failed - This refill cannot be delegated      Failed - Urine Drug Screen completed in last 360 days.      Failed - Valid encounter within last 6 months    Recent Outpatient Visits          6 months ago Annual physical exam   [facility] Reubin Milan, MD   1 year ago Vertigo   [facility] Reubin Milan, MD   1 year ago Annual physical exam   [facility] Reubin Milan, MD   2 years ago Essential hypertension   [facility] Reubin Milan, MD   2 years ago Essential hypertension   [facility] Reubin Milan, MD      Future Appointments            In 5 months Judithann Graves Nyoka Cowden, MD [facility]           . losartan-hydrochlorothiazide (HYZAAR) 100-12.5 MG tablet [Pharmacy Med Name: LOSARTAN-HCTZ 100-12.5 MG TAB] 90 tablet 0    Sig: Take 1 tablet by mouth daily.     Cardiovascular: ARB + Diuretic Combos Failed - 06/14/2019  1:16 PM      Failed - K in normal range and within 180 days    Potassium  Date Value Ref Range Status  12/02/2018 5.2 3.5 - 5.2 mmol/L Final         Failed - Na in normal range and within 180 days    Sodium  Date Value Ref Range Status  12/02/2018 141 134 - 144 mmol/L Final         Failed - Cr in normal range and within 180 days    Creatinine, Ser  Date Value Ref Range Status  12/02/2018 0.66 0.57 - 1.00 mg/dL Final         Failed - Ca in normal range and within 180 days    Calcium  Date Value Ref Range Status  12/02/2018 9.6 8.7 - 10.3 mg/dL Final         Failed - Valid encounter within last 6 months    Recent Outpatient Visits          6  months ago Annual physical exam   [facility] Reubin Milan, MD   1 year ago Vertigo   [facility] Reubin Milan, MD   1 year ago Annual physical exam   [facility] Reubin Milan, MD   2 years ago Essential hypertension   [facility] Reubin Milan, MD   2 years ago Essential hypertension   [facility] Reubin Milan, MD      Future Appointments            In 5 months Reubin Milan, MD [facility]           Passed - Patient is not pregnant      Passed - Last BP in normal range    BP Readings from Last 1 Encounters:  12/02/18 112/64

## 2019-07-28 ENCOUNTER — Ambulatory Visit (INDEPENDENT_AMBULATORY_CARE_PROVIDER_SITE_OTHER): Payer: 59 | Admitting: Internal Medicine

## 2019-07-28 ENCOUNTER — Other Ambulatory Visit: Payer: Self-pay

## 2019-07-28 ENCOUNTER — Encounter: Payer: Self-pay | Admitting: Internal Medicine

## 2019-07-28 VITALS — BP 108/62 | HR 71 | Ht 64.5 in | Wt 200.4 lb

## 2019-07-28 DIAGNOSIS — G43809 Other migraine, not intractable, without status migrainosus: Secondary | ICD-10-CM

## 2019-07-28 DIAGNOSIS — G629 Polyneuropathy, unspecified: Secondary | ICD-10-CM

## 2019-07-28 DIAGNOSIS — I10 Essential (primary) hypertension: Secondary | ICD-10-CM

## 2019-07-28 DIAGNOSIS — F325 Major depressive disorder, single episode, in full remission: Secondary | ICD-10-CM

## 2019-07-28 DIAGNOSIS — M26621 Arthralgia of right temporomandibular joint: Secondary | ICD-10-CM

## 2019-07-28 DIAGNOSIS — E559 Vitamin D deficiency, unspecified: Secondary | ICD-10-CM

## 2019-07-28 DIAGNOSIS — R7303 Prediabetes: Secondary | ICD-10-CM

## 2019-07-28 DIAGNOSIS — G43009 Migraine without aura, not intractable, without status migrainosus: Secondary | ICD-10-CM

## 2019-07-28 MED ORDER — PREGABALIN 50 MG PO CAPS: 50.0000 mg | ORAL_CAPSULE | Freq: Three times a day (TID) | ORAL | 0 refills | Status: DC

## 2019-07-28 MED ORDER — SUMATRIPTAN SUCCINATE 4 MG/0.5ML ~~LOC~~ SOAJ: 4.0000 mg | Freq: Every day | SUBCUTANEOUS | 5 refills | Status: DC | PRN

## 2019-07-28 MED ORDER — DULOXETINE HCL 60 MG PO CPEP: 60.0000 mg | ORAL_CAPSULE | Freq: Every day | ORAL | 1 refills | Status: DC

## 2019-07-28 MED ORDER — NEBIVOLOL HCL 5 MG PO TABS: ORAL_TABLET | ORAL | 1 refills | Status: DC

## 2019-07-28 MED ORDER — SUMATRIPTAN SUCCINATE 100 MG PO TABS: 100.0000 mg | ORAL_TABLET | Freq: Every day | ORAL | 3 refills | Status: DC | PRN

## 2019-07-28 MED ORDER — LOSARTAN POTASSIUM-HCTZ 100-12.5 MG PO TABS: 1.0000 | ORAL_TABLET | Freq: Every day | ORAL | 0 refills | Status: DC

## 2019-07-28 NOTE — Progress Notes (Addendum)
Date:  07/28/2019   Name:  [name]   DOB:  1955/05/04   MRN:  [id number]   Chief Complaint: Temporomandibular Joint Pain (Right jaw pain. Having to eat soft foods. Needs referral for Dr Leory Plowman, or Dr Matthew Saras at [facility]. [phone])   Hypertension This is a chronic problem. The problem is controlled. Pertinent negatives include no chest pain, headaches or shortness of breath. Past treatments include angiotensin blockers and diuretics. The current treatment provides significant improvement.  Vitamin D def - has been instructed to increase vitamin but has not done so consistently.  Jaw pain - started last September after biting a hard apple.  Since then it has been progressive.  Very severe pain on the right, has to eat only soft foods.  Also having more migraine headaches and right ear lancinating pains.  Aleve has been of no benefit.  Neuropathy - uncertain cause but it does run in her family. She has numbness in both feet as well as chronic pain.  She is prediabetic so this could be secondary to DM. (last A1C 6.3) She has tried gabapentin in the past without benefit - dose uncertain. She is currently on Cymbalta with no improvement in her symptoms.  She has not tried Lyrica.  She has not had a neurology evaluation.  B12 levels have been normal.  Lab Results  Component Value Date   CREATININE 0.66 12/02/2018   BUN 11 12/02/2018   NA 141 12/02/2018   K 5.2 12/02/2018   CL 100 12/02/2018   CO2 26 12/02/2018   Lab Results  Component Value Date   CHOL 195 12/02/2018   HDL 51 12/02/2018   LDLCALC 124 (H) 12/02/2018   LDLDIRECT 161.1 11/08/2010   TRIG 114 12/02/2018   CHOLHDL 3.8 12/02/2018   Lab Results  Component Value Date   TSH 1.630 12/02/2018   Lab Results  Component Value Date   HGBA1C 6.3 (H) 12/02/2018   Lab Results  Component Value Date   WBC 7.6 12/02/2018   HGB 14.4 12/02/2018   HCT 43.7 12/02/2018   MCV 96 12/02/2018   PLT 289  12/02/2018   Lab Results  Component Value Date   ALT 65 (H) 12/02/2018   AST 53 (H) 12/02/2018   ALKPHOS 138 (H) 12/02/2018   BILITOT 0.5 12/02/2018     Review of Systems  Constitutional: Negative for chills, fatigue, fever and unexpected weight change.  Respiratory: Negative for cough, chest tightness and shortness of breath.   Cardiovascular: Negative for chest pain and leg swelling.       Pain under left breast over ribs  Gastrointestinal: Negative for abdominal pain, constipation and diarrhea.  Musculoskeletal: Positive for arthralgias.  Neurological: Positive for numbness (and pain in both feet). Negative for dizziness and headaches.  Hematological: Negative for adenopathy.  Psychiatric/Behavioral: Positive for dysphoric mood and sleep disturbance. The patient is not nervous/anxious.     Patient Active Problem List   Diagnosis Date Noted  . Osteopenia determined by x-ray 04/26/2019  . Major depressive disorder with single episode, in full remission (HCC) 12/02/2018  . Mixed hyperlipidemia 07/15/2017  . Pre-diabetes 04/18/2017  . Vitamin D deficiency 04/18/2017  . Sleep apnea 02/28/2015  . Obesity 02/28/2015  . Routine general medical examination at a health care facility 08/04/2013  . Hemorrhoids 03/04/2012  . Insomnia 03/04/2012  . Arthralgia 03/04/2012  . Migraine without aura or status migrainosus 03/04/2012  . Essential hypertension 11/08/2010    Allergies  Allergen Reactions  . Amlodipine Swelling    And headache    Past Surgical History:  Procedure Laterality Date  . ABDOMINAL HYSTERECTOMY  2008   for Menorrhagia  . BREAST REDUCTION SURGERY    . LAPAROSCOPY    . REDUCTION MAMMAPLASTY Bilateral 1991    Social History   Tobacco Use  . Smoking status: Never Smoker  . Smokeless tobacco: Never Used  Vaping Use  . Vaping Use: Never used  Substance Use Topics  . Alcohol use: Not Currently    Comment: wine 2x a week  . Drug use: No      Medication list has been reviewed and updated.  Current Meds  Medication Sig  . cholecalciferol (VITAMIN D) 1000 units tablet Take 2,000 Units by mouth daily.  . DULoxetine (CYMBALTA) 60 MG capsule Take 1 capsule (60 mg total) by mouth daily.  Marland Kitchen losartan-hydrochlorothiazide (HYZAAR) 100-12.5 MG tablet Take 1 tablet by mouth daily.  . naproxen (NAPROSYN) 500 MG tablet Take 1 tablet (500 mg total) by mouth 2 (two) times daily with a meal. (Patient taking differently: Take 500 mg by mouth 2 (two) times daily with a meal. PRN)  . nebivolol (BYSTOLIC) 5 MG tablet Take 1 tablet (5 mg total) by mouth daily.  . SUMAtriptan (IMITREX) 100 MG tablet Take 1 tablet (100 mg total) by mouth daily as needed.  . SUMAtriptan Succinate 4 MG/0.5ML SOAJ Inject 4 mg into the skin daily as needed.  . zolpidem (AMBIEN) 10 MG tablet Take 1 tablet (10 mg total) by mouth at bedtime as needed for sleep.    PHQ 2/9 Scores 07/28/2019 12/02/2018 12/05/2017 07/04/2017  PHQ - 2 Score 2 2 4  0  PHQ- 9 Score 9 8 13 6     GAD 7 : Generalized Anxiety Score 07/28/2019  Nervous, Anxious, on Edge 1  Control/stop worrying 0  Worry too much - different things 0  Trouble relaxing 1  Restless 1  Easily annoyed or irritable 0  Afraid - awful might happen 0  Total GAD 7 Score 3  Anxiety Difficulty Not difficult at all    BP Readings from Last 3 Encounters:  07/28/19 108/62  12/02/18 112/64  05/20/18 (!) 158/60    Physical Exam Vitals and nursing note reviewed.  Constitutional:      General: She is not in acute distress.    Appearance: Normal appearance. She is well-developed.  HENT:     Head: Normocephalic and atraumatic.     Jaw: Tenderness, swelling, pain on movement and malocclusion present.  Cardiovascular:     Rate and Rhythm: Normal rate and regular rhythm.     Pulses: Normal pulses.  Pulmonary:     Effort: Pulmonary effort is normal. No respiratory distress.     Breath sounds: No wheezing or rhonchi.   Musculoskeletal:     Cervical back: Normal range of motion.     Right lower leg: No edema.     Left lower leg: No edema.  Lymphadenopathy:     Cervical: No cervical adenopathy.  Skin:    General: Skin is warm and dry.     Capillary Refill: Capillary refill takes less than 2 seconds.     Findings: No rash.  Neurological:     General: No focal deficit present.     Mental Status: She is alert and oriented to person, place, and time.  Psychiatric:        Mood and Affect: Mood normal.  Behavior: Behavior normal.     Wt Readings from Last 3 Encounters:  07/28/19 200 lb 6.4 oz (90.9 kg)  12/02/18 198 lb (89.8 kg)  05/20/18 200 lb (90.7 kg)    BP 108/62 (BP Location: Right Arm, Patient Position: Sitting, Cuff Size: Normal)   Pulse 71   Ht 5' 4.5" (1.638 m)   Wt 200 lb 6.4 oz (90.9 kg)   SpO2 98%   BMI 33.87 kg/m   Assessment and Plan: 1. Essential hypertension Clinically stable exam with well controlled BP. Tolerating medications without side effects at this time. Pt to continue current regimen and low sodium diet; benefits of regular exercise as able discussed. - losartan-hydrochlorothiazide (HYZAAR) 100-12.5 MG tablet; Take 1 tablet by mouth daily.  Dispense: 90 tablet; Refill: 0 - nebivolol (BYSTOLIC) 5 MG tablet; Take 1 tablet (5 mg total) by mouth daily.  Dispense: 90 tablet; Refill: 1  2. Neuropathy Likely due to hyperglycemia. She is currently on Cymbalta which she has taken for several years without change in her symptoms of pain and numbness in both feet She tried gabapentin in the past without benefit (from previous PCP) Will try lyrica - pregabalin (LYRICA) 50 MG capsule; Take 1 capsule (50 mg total) by mouth 3 (three) times daily.  Dispense: 90 capsule; Refill: 0  3. Major depressive disorder with single episode, in full remission (HCC) Clinically stable on current regimen with good control of symptoms, No SI or HI. Will continue current therapy. -  DULoxetine (CYMBALTA) 60 MG capsule; Take 1 capsule (60 mg total) by mouth daily.  Dispense: 90 capsule; Refill: 1  4. Vitamin D deficiency Recommend daily 2000 IU  5. Arthralgia of right temporomandibular joint Severe pain and malocclusion - Ambulatory referral to Pain Clinic  6. Migraine without aura and without status migrainosus, not intractable Refill medications - SUMAtriptan (IMITREX) 100 MG tablet; Take 1 tablet (100 mg total) by mouth daily as needed.  Dispense: 30 tablet; Refill: 3 - SUMAtriptan Succinate 4 MG/0.5ML SOAJ; Inject 4 mg into the skin daily as needed.  Dispense: 3 mL; Refill: 5  7. Pre-diabetes A1C still below 6.5 but the presence of neuropathy makes control of hyperglycemia more important. May need to monitor more often than yearly   Form for [facility] completed. Partially dictated using Animal nutritionist. Any errors are unintentional.  Bari Edward, MD [facility]  07/28/2019

## 2019-07-30 ENCOUNTER — Telehealth: Payer: Self-pay

## 2019-07-30 DIAGNOSIS — G629 Polyneuropathy, unspecified: Secondary | ICD-10-CM | POA: Insufficient documentation

## 2019-07-30 NOTE — Telephone Encounter (Signed)
Completed PA on [email or web address].  (Key: [id number])  Awaiting Faxed outcome from [facility]. CB#  [phone].

## 2019-08-04 ENCOUNTER — Telehealth: Payer: Self-pay | Admitting: Internal Medicine

## 2019-08-04 NOTE — Telephone Encounter (Signed)
Copied from CRM [id number]. Topic: General - Call Back - No Documentation >> Aug 04, 2019 12:20 PM Reuben Likes D wrote: Reason for CRM: Patient states she requested a RX for Losartan on last week and the pharmacy advised that they need prior authorization from PCP. Please contact patient with resolution

## 2019-08-09 NOTE — Telephone Encounter (Signed)
Received Denial letter from patients insurance on this medication. They stated they denied this because the patient needs proof of diabetic neuropathy.   Sent appeal notice with documented diagnosis, and A1C labs.  Awaiting for outcome from insurance.   CM

## 2019-08-09 NOTE — Telephone Encounter (Signed)
Called pt told her that we got her prior auth. back and it was denied that we had to send over more information it could take a few more days to hear something back pt verbalized understanding. Pt also ask about vertigo wanted to know what she could do told pt that it could take 7-10 days to get better and if it last longer than that to give [place] a call and she can come in and we could send ina referral so she can be seen for vertigo. Pt verbalized understanding.  KP

## 2019-08-12 ENCOUNTER — Telehealth: Payer: Self-pay | Admitting: Internal Medicine

## 2019-08-12 NOTE — Telephone Encounter (Signed)
Called [facility] [employer] in reference to pregabalin for pt. [employer] [employer] needs more information. Needs chart notes documenting diagnosis of diabetes and neuropathic pain associated with diabetic neuropathy. Front sheet shout say "please attach to SKA#[id number]" fax number [phone]  KP

## 2019-08-12 NOTE — Telephone Encounter (Signed)
Copied from CRM [id number]. Topic: General - Inquiry >> Aug 12, 2019  2:06 PM Leary Roca wrote: Reason for CRM: Marissa calling from [employer] to ask a few questions for [place] or her nurse to follow up on. Chart notes for medication request [id number]  Call back [phone] select option 3

## 2019-08-13 NOTE — Telephone Encounter (Signed)
Faxed [facility] Patient last OV note and last labs.  CM

## 2019-08-16 NOTE — Telephone Encounter (Signed)
Called to inform [name] that she was faxing some information and if she needed to call, please call to discuss at [phone]

## 2019-08-16 NOTE — Telephone Encounter (Signed)
Received fax from insurance saying they still are denying patients medication. Placed the notes in Dr Senaida Lange box to review and will call pt with further instruction after.  CM

## 2019-08-16 NOTE — Telephone Encounter (Signed)
Called patients insurance company after receiving 2nd denial. They are going to re-review this information and let us know if it can be approved or not.  CM

## 2019-08-17 NOTE — Telephone Encounter (Signed)
Received info fax from patients insurance. Faxed over the information requested for PA - [employer].   CM

## 2019-08-17 NOTE — Telephone Encounter (Signed)
Received review information for pt PA. Faxed off addended office notes that state pt tried and failed gabapentin and cymbalta.  Awaiting outcome from insurance.    CM

## 2019-08-20 NOTE — Telephone Encounter (Signed)
Please Advise.  KP

## 2019-08-20 NOTE — Telephone Encounter (Signed)
Audery Amel from [facility] called stating that they have not received the info requested. Please advise.    [phone]

## 2019-08-23 NOTE — Telephone Encounter (Signed)
Just received PA form this morning. Completed and attached last office visit not with it for documentation. Faxed back to [employer].  CM

## 2019-09-29 ENCOUNTER — Other Ambulatory Visit: Payer: Self-pay | Admitting: Critical Care Medicine

## 2019-09-29 ENCOUNTER — Other Ambulatory Visit: Payer: Self-pay

## 2019-09-29 ENCOUNTER — Other Ambulatory Visit: Payer: 59

## 2019-09-29 DIAGNOSIS — Z20822 Contact with and (suspected) exposure to covid-19: Secondary | ICD-10-CM

## 2019-09-30 LAB — HM DIABETES EYE EXAM

## 2019-09-30 LAB — NOVEL CORONAVIRUS, NAA: SARS-CoV-2, NAA: NOT DETECTED

## 2019-12-06 ENCOUNTER — Encounter: Payer: BLUE CROSS/BLUE SHIELD | Admitting: Internal Medicine

## 2019-12-16 ENCOUNTER — Other Ambulatory Visit: Payer: Self-pay | Admitting: Internal Medicine

## 2019-12-16 DIAGNOSIS — F5101 Primary insomnia: Secondary | ICD-10-CM

## 2019-12-16 NOTE — Telephone Encounter (Signed)
Please Advise. Last office visit 07/28/2019.  KP

## 2019-12-16 NOTE — Telephone Encounter (Signed)
Requested medication (s) are due for refill today: yes  Requested medication (s) are on the active medication list: yes  Last refill:  06/14/19 #30 with 5 refills  Future visit scheduled: yes  Notes to clinic:  Please review for refill. Refill not delegated per protocol    Requested Prescriptions  Pending Prescriptions Disp Refills   zolpidem (AMBIEN) 10 MG tablet [Pharmacy Med Name: ZOLPIDEM TARTRATE 10 MG TABLET] 30 tablet 0    Sig: Take 1 tablet (10 mg total) by mouth at bedtime as needed for sleep.      Not Delegated - Psychiatry:  Anxiolytics/Hypnotics Failed - 12/16/2019 12:44 PM      Failed - This refill cannot be delegated      Failed - Urine Drug Screen completed in last 360 days      Passed - Valid encounter within last 6 months    Recent Outpatient Visits           4 months ago Essential hypertension   [facility] Reubin Milan, MD   1 year ago Annual physical exam   [facility] Reubin Milan, MD   2 years ago Vertigo   [facility] Reubin Milan, MD   2 years ago Annual physical exam   [facility] Reubin Milan, MD   2 years ago Essential hypertension   [facility] Reubin Milan, MD       Future Appointments             In 1 month Judithann Graves Nyoka Cowden, MD [facility]

## 2020-02-09 ENCOUNTER — Other Ambulatory Visit: Payer: Self-pay | Admitting: Internal Medicine

## 2020-02-09 ENCOUNTER — Other Ambulatory Visit (HOSPITAL_COMMUNITY)
Admission: RE | Admit: 2020-02-09 | Discharge: 2020-02-09 | Disposition: A | Discharge status: Home / Self Care | Payer: 59 | Source: Ambulatory Visit | Attending: Internal Medicine | Admitting: Internal Medicine

## 2020-02-09 ENCOUNTER — Encounter: Payer: Self-pay | Admitting: Internal Medicine

## 2020-02-09 ENCOUNTER — Encounter: Payer: 59 | Admitting: Internal Medicine

## 2020-02-09 ENCOUNTER — Other Ambulatory Visit: Payer: Self-pay

## 2020-02-09 ENCOUNTER — Ambulatory Visit (INDEPENDENT_AMBULATORY_CARE_PROVIDER_SITE_OTHER): Payer: 59 | Admitting: Internal Medicine

## 2020-02-09 VITALS — BP 106/72 | HR 72 | Temp 98.0°F | Ht 64.5 in | Wt 192.0 lb

## 2020-02-09 DIAGNOSIS — Z1272 Encounter for screening for malignant neoplasm of vagina: Secondary | ICD-10-CM

## 2020-02-09 DIAGNOSIS — R7303 Prediabetes: Secondary | ICD-10-CM

## 2020-02-09 DIAGNOSIS — M8580 Other specified disorders of bone density and structure, unspecified site: Secondary | ICD-10-CM

## 2020-02-09 DIAGNOSIS — Z0000 Encounter for general adult medical examination without abnormal findings: Secondary | ICD-10-CM

## 2020-02-09 DIAGNOSIS — I10 Essential (primary) hypertension: Secondary | ICD-10-CM

## 2020-02-09 DIAGNOSIS — E782 Mixed hyperlipidemia: Secondary | ICD-10-CM

## 2020-02-09 DIAGNOSIS — F5101 Primary insomnia: Secondary | ICD-10-CM

## 2020-02-09 DIAGNOSIS — F331 Major depressive disorder, recurrent, moderate: Secondary | ICD-10-CM | POA: Insufficient documentation

## 2020-02-09 DIAGNOSIS — Z1231 Encounter for screening mammogram for malignant neoplasm of breast: Secondary | ICD-10-CM | POA: Diagnosis not present

## 2020-02-09 DIAGNOSIS — Z90710 Acquired absence of both cervix and uterus: Secondary | ICD-10-CM | POA: Insufficient documentation

## 2020-02-09 DIAGNOSIS — G43009 Migraine without aura, not intractable, without status migrainosus: Secondary | ICD-10-CM

## 2020-02-09 LAB — POCT URINALYSIS DIPSTICK
Bilirubin, UA: NEGATIVE
Blood, UA: NEGATIVE
Glucose, UA: NEGATIVE
Ketones, UA: NEGATIVE
Leukocytes, UA: NEGATIVE
Nitrite, UA: NEGATIVE
Protein, UA: NEGATIVE
Spec Grav, UA: 1.015 (ref 1.010–1.025)
Urobilinogen, UA: 0.2 E.U./dL
pH, UA: 6.5 (ref 5.0–8.0)

## 2020-02-09 MED ORDER — DULOXETINE HCL 30 MG PO CPEP: 30.0000 mg | ORAL_CAPSULE | Freq: Every day | ORAL | 1 refills | Status: DC

## 2020-02-09 MED ORDER — LOSARTAN POTASSIUM-HCTZ 100-12.5 MG PO TABS: 1.0000 | ORAL_TABLET | Freq: Every day | ORAL | 1 refills | Status: DC

## 2020-02-09 MED ORDER — METFORMIN HCL 500 MG PO TABS: 500.0000 mg | ORAL_TABLET | Freq: Two times a day (BID) | ORAL | 1 refills | Status: DC

## 2020-02-09 MED ORDER — DULOXETINE HCL 60 MG PO CPEP: 60.0000 mg | ORAL_CAPSULE | Freq: Every day | ORAL | 1 refills | Status: DC

## 2020-02-09 MED ORDER — DIAZEPAM 5 MG PO TABS: 5.0000 mg | ORAL_TABLET | Freq: Four times a day (QID) | ORAL | 0 refills | Status: DC | PRN

## 2020-02-09 MED ORDER — ZOLPIDEM TARTRATE 10 MG PO TABS: 10.0000 mg | ORAL_TABLET | Freq: Every evening | ORAL | 5 refills | Status: DC | PRN

## 2020-02-09 MED ORDER — NEBIVOLOL HCL 5 MG PO TABS: ORAL_TABLET | ORAL | 1 refills | Status: DC

## 2020-02-09 NOTE — Progress Notes (Signed)
Date:  02/09/2020   Name:  [name]   DOB:  1955/09/27   MRN:  [id number]   Chief Complaint: Annual Exam (Breast exam no pap/ still has ovaries /)  [name] is a 65 y.o. female who presents today for her Complete Annual Exam. She feels well. She reports exercising walking x7 days a week. She reports she is sleeping poorly. Breast complaints none.  Mammogram: 03/2019 DEXA:  03/2019 osteopenia Pap smear: discontinued Pap but ovaries remain Colonoscopy: 03/2012 repeat 2024  Immunization History  Administered Date(s) Administered  . Influenza Split 09/21/2014  . Influenza,inj,Quad PF,6+ Mos 11/02/2016, 10/28/2017, 11/03/2018  . Influenza-Unspecified 11/04/2012, 11/04/2016, 11/03/2018, 10/12/2019  . Moderna Sars-Covid-2 Vaccination 10/12/2019, 11/09/2019  . Tdap 07/02/2016  . Zoster Recombinat (Shingrix) 12/02/2018, 03/08/2019    Hypertension This is a chronic problem. The problem is controlled. Associated symptoms include blurred vision. Pertinent negatives include no chest pain, headaches, palpitations or shortness of breath. Past treatments include beta blockers, angiotensin blockers and diuretics. The current treatment provides significant improvement.  Migraine  This is a recurrent problem. The problem has been unchanged. Associated symptoms include blurred vision, a visual change, weakness and weight loss. Pertinent negatives include no abdominal pain, coughing, dizziness, fever, hearing loss, tinnitus or vomiting. She has tried triptans for the symptoms. Her past medical history is significant for hypertension.  Depression        This is a chronic problem.  The problem occurs daily.  The problem has been gradually worsening since onset.  Associated symptoms include decreased concentration, fatigue, helplessness, hopelessness, irritable, decreased interest and sad.  Associated symptoms include no headaches and no suicidal ideas.  Past treatments include SNRIs -  Serotonin and norepinephrine reuptake inhibitors.  Compliance with treatment is good.  Previous treatment provided moderate relief. Diabetes She presents for her initial diabetic visit. She has type 2 diabetes mellitus. Onset time: over the past month glucoses over 200. Her disease course has been worsening. There are no hypoglycemic associated symptoms. Pertinent negatives for hypoglycemia include no dizziness, headaches, nervousness/anxiousness or tremors. Associated symptoms include blurred vision, fatigue, foot paresthesias, polydipsia, polyuria, visual change, weakness and weight loss. Pertinent negatives for diabetes include no chest pain. Current diabetic treatment includes diet. She is compliant with treatment most of the time. She is following a diabetic diet. An ACE inhibitor/angiotensin II receptor blocker is being taken.    Lab Results  Component Value Date   CREATININE 0.66 12/02/2018   BUN 11 12/02/2018   NA 141 12/02/2018   K 5.2 12/02/2018   CL 100 12/02/2018   CO2 26 12/02/2018   Lab Results  Component Value Date   CHOL 195 12/02/2018   HDL 51 12/02/2018   LDLCALC 124 (H) 12/02/2018   LDLDIRECT 161.1 11/08/2010   TRIG 114 12/02/2018   CHOLHDL 3.8 12/02/2018   Lab Results  Component Value Date   TSH 1.630 12/02/2018   Lab Results  Component Value Date   HGBA1C 6.3 (H) 12/02/2018   Lab Results  Component Value Date   WBC 7.6 12/02/2018   HGB 14.4 12/02/2018   HCT 43.7 12/02/2018   MCV 96 12/02/2018   PLT 289 12/02/2018   Lab Results  Component Value Date   ALT 65 (H) 12/02/2018   AST 53 (H) 12/02/2018   ALKPHOS 138 (H) 12/02/2018   BILITOT 0.5 12/02/2018     Review of Systems  Constitutional: Positive for fatigue, unexpected weight change (has been cutting back on carbs)  and weight loss. Negative for chills and fever.  HENT: Negative for congestion, hearing loss, tinnitus, trouble swallowing and voice change.   Eyes: Positive for blurred vision.  Negative for visual disturbance.  Respiratory: Negative for cough, chest tightness, shortness of breath and wheezing.   Cardiovascular: Negative for chest pain, palpitations and leg swelling.  Gastrointestinal: Positive for anal bleeding (intermittent hemorrhoidal bleeding). Negative for abdominal pain, constipation, diarrhea and vomiting.  Endocrine: Positive for polydipsia and polyuria.  Genitourinary: Negative for dysuria, frequency, genital sores, vaginal bleeding and vaginal discharge.  Musculoskeletal: Negative for arthralgias, gait problem and joint swelling.  Skin: Negative for color change and rash.  Neurological: Positive for weakness. Negative for dizziness, tremors, light-headedness and headaches.  Hematological: Negative for adenopathy. Does not bruise/bleed easily.  Psychiatric/Behavioral: Positive for decreased concentration and depression. Negative for dysphoric mood, sleep disturbance and suicidal ideas. The patient is not nervous/anxious.     Patient Active Problem List   Diagnosis Date Noted  . S/P hysterectomy 02/09/2020  . Neuropathy 07/30/2019  . Osteopenia determined by x-ray 04/26/2019  . Major depressive disorder with single episode, in full remission (HCC) 12/02/2018  . Mixed hyperlipidemia 07/15/2017  . Pre-diabetes 04/18/2017  . Vitamin D deficiency 04/18/2017  . Sleep apnea 02/28/2015  . Obesity 02/28/2015  . Routine general medical examination at a health care facility 08/04/2013  . Hemorrhoids 03/04/2012  . Insomnia 03/04/2012  . Arthralgia 03/04/2012  . Migraine without aura or status migrainosus 03/04/2012  . Arthralgia of right temporomandibular joint 03/04/2012  . Essential hypertension 11/08/2010    Allergies  Allergen Reactions  . Amlodipine Swelling    And headache    Past Surgical History:  Procedure Laterality Date  . ABDOMINAL HYSTERECTOMY  2008   for Menorrhagia  . BREAST REDUCTION SURGERY    . LAPAROSCOPY    . REDUCTION  MAMMAPLASTY Bilateral 1991    Social History   Tobacco Use  . Smoking status: Never Smoker  . Smokeless tobacco: Never Used  Vaping Use  . Vaping Use: Never used  Substance Use Topics  . Alcohol use: Not Currently    Comment: wine 2x a week  . Drug use: No     Medication list has been reviewed and updated.  Current Meds  Medication Sig  . DULoxetine (CYMBALTA) 60 MG capsule Take 1 capsule (60 mg total) by mouth daily.  Marland Kitchen. losartan-hydrochlorothiazide (HYZAAR) 100-12.5 MG tablet Take 1 tablet by mouth daily.  . naproxen (NAPROSYN) 500 MG tablet Take 1 tablet (500 mg total) by mouth 2 (two) times daily with a meal. (Patient taking differently: Take 500 mg by mouth 2 (two) times daily with a meal. PRN)  . nebivolol (BYSTOLIC) 5 MG tablet Take 1 tablet (5 mg total) by mouth daily.  . SUMAtriptan (IMITREX) 100 MG tablet Take 1 tablet (100 mg total) by mouth daily as needed.  . SUMAtriptan Succinate 4 MG/0.5ML SOAJ Inject 4 mg into the skin daily as needed.  . Turmeric (QC TUMERIC COMPLEX) 500 MG CAPS Take by mouth daily.  . vitamin B-12 (CYANOCOBALAMIN) 1000 MCG tablet Take 1,000 mcg by mouth daily.  Marland Kitchen. zolpidem (AMBIEN) 10 MG tablet Take 1 tablet (10 mg total) by mouth at bedtime as needed for sleep.    PHQ 2/9 Scores 02/09/2020 07/28/2019 12/02/2018 12/05/2017  PHQ - 2 Score 4 2 2 4   PHQ- 9 Score 14 9 8 13     GAD 7 : Generalized Anxiety Score 02/09/2020 07/28/2019  Nervous, Anxious, on Edge 2  1  Control/stop worrying 2 0  Worry too much - different things 2 0  Trouble relaxing 0 1  Restless 0 1  Easily annoyed or irritable 3 0  Afraid - awful might happen 2 0  Total GAD 7 Score 11 3  Anxiety Difficulty - Not difficult at all    BP Readings from Last 3 Encounters:  02/09/20 106/72  07/28/19 108/62  12/02/18 112/64    Physical Exam Vitals and nursing note reviewed.  Constitutional:      General: She is irritable. She is not in acute distress.    Appearance: She is  well-developed.  HENT:     Head: Normocephalic and atraumatic.     Right Ear: Tympanic membrane and ear canal normal.     Left Ear: Tympanic membrane and ear canal normal.     Nose:     Right Sinus: No maxillary sinus tenderness.     Left Sinus: No maxillary sinus tenderness.  Eyes:     General: No scleral icterus.       Right eye: No discharge.        Left eye: No discharge.     Conjunctiva/sclera: Conjunctivae normal.  Neck:     Thyroid: No thyromegaly.     Vascular: No carotid bruit.  Cardiovascular:     Rate and Rhythm: Normal rate and regular rhythm.     Pulses: Normal pulses.     Heart sounds: Normal heart sounds.  Pulmonary:     Effort: Pulmonary effort is normal. No respiratory distress.     Breath sounds: No wheezing.  Chest:  Breasts:     Right: No mass, nipple discharge, skin change or tenderness.     Left: No mass, nipple discharge, skin change or tenderness.    Abdominal:     General: Bowel sounds are normal.     Palpations: Abdomen is soft.     Tenderness: There is no abdominal tenderness.  Genitourinary:    Labia:        Right: No tenderness, lesion or injury.        Left: No tenderness, lesion or injury.      Vagina: Normal.     Uterus: Absent.      Adnexa: Right adnexa normal and left adnexa normal.       Right: No mass or fullness.         Left: No mass, tenderness or fullness.       Rectum: External hemorrhoid (large cluster of hemorrhoids) present.     Comments: Vaginal pap obtained Musculoskeletal:        General: Normal range of motion.     Cervical back: Normal range of motion. No erythema.     Right lower leg: No edema.     Left lower leg: No edema.  Lymphadenopathy:     Cervical: No cervical adenopathy.  Skin:    General: Skin is warm and dry.     Capillary Refill: Capillary refill takes less than 2 seconds.     Findings: No lesion or rash.  Neurological:     General: No focal deficit present.     Mental Status: She is alert and  oriented to person, place, and time.     Cranial Nerves: No cranial nerve deficit.     Sensory: No sensory deficit.     Deep Tendon Reflexes: Reflexes are normal and symmetric.  Psychiatric:        Attention and Perception: Attention normal.  Mood and Affect: Mood is depressed.        Speech: Speech normal.        Thought Content: Thought content does not include suicidal ideation. Thought content does not include suicidal plan.     Wt Readings from Last 3 Encounters:  02/09/20 192 lb (87.1 kg)  07/28/19 200 lb 6.4 oz (90.9 kg)  12/02/18 198 lb (89.8 kg)    BP 106/72   Pulse 72   Temp 98 F (36.7 C) (Oral)   Ht 5' 4.5" (1.638 m)   Wt 192 lb (87.1 kg)   SpO2 100%   BMI 32.45 kg/m   Assessment and Plan: 1. Annual physical exam Exam is normal except for weight. Encourage regular exercise and appropriate dietary changes.  2. Encounter for screening mammogram for breast cancer Schedule at [facility] - MM 3D SCREEN BREAST BILATERAL; Future  3. Essential hypertension Clinically stable exam with well controlled BP. Tolerating medications without side effects at this time. Pt to continue current regimen and low sodium diet; benefits of regular exercise as able discussed. - CBC with Differential/Platelet - Comprehensive metabolic panel - POCT urinalysis dipstick - nebivolol (BYSTOLIC) 5 MG tablet; Take 1 tablet (5 mg total) by mouth daily.  Dispense: 90 tablet; Refill: 1 - losartan-hydrochlorothiazide (HYZAAR) 100-12.5 MG tablet; Take 1 tablet by mouth daily.  Dispense: 90 tablet; Refill: 1  4. Migraine without aura and without status migrainosus, not intractable controlled  5. Pre-diabetes Apparently now overt DM type 2. Continue diet changes and BS monitoring Begin metformin bid with food and follow up in 3 months - Hemoglobin A1c - Microalbumin / creatinine urine ratio  6. Mixed hyperlipidemia May need to start medication  - Lipid panel  7. Osteopenia  determined by x-ray Will determine need for Vitamin D supplementation Regular exercise recommended along with sufficient dietary calcium daily - VITAMIN D 25 Hydroxy (Vit-D Deficiency, Fractures)  8. Screening for vaginal cancer - Cytology - PAP  9. Primary insomnia Continue Ambien nightly PRN - zolpidem (AMBIEN) 10 MG tablet; Take 1 tablet (10 mg total) by mouth at bedtime as needed for sleep.  Dispense: 30 tablet; Refill: 5  10. Moderate episode of recurrent major depressive disorder (HCC) Continue Cymbalta 90 mg daily - TSH - DULoxetine (CYMBALTA) 60 MG capsule; Take 1 capsule (60 mg total) by mouth daily.  Dispense: 90 capsule; Refill: 1 - DULoxetine (CYMBALTA) 30 MG capsule; Take 1 capsule (30 mg total) by mouth daily.  Dispense: 90 capsule; Refill: 1   Partially dictated using Animal nutritionist. Any errors are unintentional.  Bari Edward, MD [facility]  02/09/2020

## 2020-02-10 LAB — COMPREHENSIVE METABOLIC PANEL
ALT: 75 IU/L — ABNORMAL HIGH (ref 0–32)
AST: 92 IU/L — ABNORMAL HIGH (ref 0–40)
Albumin/Globulin Ratio: 1.8 (ref 1.2–2.2)
Albumin: 4.2 g/dL (ref 3.8–4.8)
Alkaline Phosphatase: 135 IU/L — ABNORMAL HIGH (ref 44–121)
BUN/Creatinine Ratio: 14 (ref 12–28)
BUN: 10 mg/dL (ref 8–27)
Bilirubin Total: 0.4 mg/dL (ref 0.0–1.2)
CO2: 23 mmol/L (ref 20–29)
Calcium: 9.5 mg/dL (ref 8.7–10.3)
Chloride: 100 mmol/L (ref 96–106)
Creatinine, Ser: 0.7 mg/dL (ref 0.57–1.00)
GFR calc Af Amer: 107 mL/min/{1.73_m2} (ref >59)
GFR calc non Af Amer: 93 mL/min/{1.73_m2} (ref >59)
Globulin, Total: 2.4 g/dL (ref 1.5–4.5)
Glucose: 191 mg/dL — ABNORMAL HIGH (ref 65–99)
Potassium: 4.3 mmol/L (ref 3.5–5.2)
Sodium: 138 mmol/L (ref 134–144)
Total Protein: 6.6 g/dL (ref 6.0–8.5)

## 2020-02-10 LAB — CBC WITH DIFFERENTIAL/PLATELET
Basophils Absolute: 0.1 10*3/uL (ref 0.0–0.2)
Basos: 1 %
EOS (ABSOLUTE): 0.1 10*3/uL (ref 0.0–0.4)
Eos: 2 %
Hematocrit: 33.2 % — ABNORMAL LOW (ref 34.0–46.6)
Hemoglobin: 10.6 g/dL — ABNORMAL LOW (ref 11.1–15.9)
Immature Grans (Abs): 0 10*3/uL (ref 0.0–0.1)
Immature Granulocytes: 0 %
Lymphocytes Absolute: 1.3 10*3/uL (ref 0.7–3.1)
Lymphs: 19 %
MCH: 26.8 pg (ref 26.6–33.0)
MCHC: 31.9 g/dL (ref 31.5–35.7)
MCV: 84 fL (ref 79–97)
Monocytes Absolute: 0.5 10*3/uL (ref 0.1–0.9)
Monocytes: 7 %
Neutrophils Absolute: 5 10*3/uL (ref 1.4–7.0)
Neutrophils: 71 %
Platelets: 278 10*3/uL (ref 150–450)
RBC: 3.96 x10E6/uL (ref 3.77–5.28)
RDW: 13.1 % (ref 11.7–15.4)
WBC: 6.9 10*3/uL (ref 3.4–10.8)

## 2020-02-10 LAB — LIPID PANEL
Chol/HDL Ratio: 5.4 ratio — ABNORMAL HIGH (ref 0.0–4.4)
Cholesterol, Total: 198 mg/dL (ref 100–199)
HDL: 37 mg/dL — ABNORMAL LOW (ref >39)
LDL Chol Calc (NIH): 136 mg/dL — ABNORMAL HIGH (ref 0–99)
Triglycerides: 138 mg/dL (ref 0–149)
VLDL Cholesterol Cal: 25 mg/dL (ref 5–40)

## 2020-02-10 LAB — TSH: TSH: 1.59 u[IU]/mL (ref 0.450–4.500)

## 2020-02-10 LAB — VITAMIN D 25 HYDROXY (VIT D DEFICIENCY, FRACTURES): Vit D, 25-Hydroxy: 21.5 ng/mL — ABNORMAL LOW (ref 30.0–100.0)

## 2020-02-10 LAB — HEMOGLOBIN A1C
Est. average glucose Bld gHb Est-mCnc: 217 mg/dL
Hgb A1c MFr Bld: 9.2 % — ABNORMAL HIGH (ref 4.8–5.6)

## 2020-02-14 LAB — CYTOLOGY - PAP
Comment: NEGATIVE
Diagnosis: NEGATIVE
High risk HPV: NEGATIVE

## 2020-02-15 ENCOUNTER — Encounter: Payer: Self-pay | Admitting: Internal Medicine

## 2020-02-15 ENCOUNTER — Other Ambulatory Visit: Payer: Self-pay | Admitting: Internal Medicine

## 2020-02-15 DIAGNOSIS — E1165 Type 2 diabetes mellitus with hyperglycemia: Secondary | ICD-10-CM

## 2020-02-15 DIAGNOSIS — IMO0002 Reserved for concepts with insufficient information to code with codable children: Secondary | ICD-10-CM

## 2020-02-15 MED ORDER — ONETOUCH ULTRA VI STRP: ORAL_STRIP | 12 refills | Status: AC

## 2020-02-22 ENCOUNTER — Other Ambulatory Visit: Payer: Self-pay

## 2020-02-22 ENCOUNTER — Encounter: Payer: Self-pay | Admitting: *Deleted

## 2020-02-22 ENCOUNTER — Encounter: Payer: 59 | Attending: Internal Medicine | Admitting: *Deleted

## 2020-02-22 VITALS — BP 112/74 | Ht 64.5 in | Wt 188.6 lb

## 2020-02-22 DIAGNOSIS — E118 Type 2 diabetes mellitus with unspecified complications: Secondary | ICD-10-CM | POA: Diagnosis present

## 2020-02-22 DIAGNOSIS — E1165 Type 2 diabetes mellitus with hyperglycemia: Secondary | ICD-10-CM | POA: Diagnosis not present

## 2020-02-22 NOTE — Progress Notes (Signed)
iabetes Self-Management Education  Visit Type: First/Initial  Appt. Start Time: 1050 Appt. End Time: 1155  02/22/2020  [name], identified by name and date of birth, is a 65 y.o. female with a diagnosis of Diabetes: Type 2.   ASSESSMENT  Blood pressure 112/74, height 5' 4.5" (1.638 m), weight 188 lb 9.6 oz (85.5 kg). Body mass index is 31.87 kg/m.   Diabetes Self-Management Education - 02/22/20 1211      Visit Information   Visit Type First/Initial      Initial Visit   Diabetes Type Type 2    Are you currently following a meal plan? Yes    What type of meal plan do you follow? "watching carbs"    Are you taking your medications as prescribed? Yes    Date Diagnosed 2 weeks      Health Coping   How would you rate your overall health? Good      Psychosocial Assessment   Patient Belief/Attitude about Diabetes Motivated to manage diabetes   "not bad and not good"   Self-care barriers None    Self-management support Doctor's office;Family    Patient Concerns Nutrition/Meal planning;Glycemic Control;Medication;Monitoring;Weight Control;Healthy Lifestyle    Special Needs None    Preferred Learning Style Visual;Auditory    Learning Readiness Change in progress    How often do you need to have someone help you when you read instructions, pamphlets, or other written materials from your doctor or pharmacy? 1 - Never    What is the last grade level you completed in school? some college      Pre-Education Assessment   Patient understands the diabetes disease and treatment process. Needs Instruction    Patient understands incorporating nutritional management into lifestyle. Needs Instruction    Patient undertands incorporating physical activity into lifestyle. Needs Instruction    Patient understands using medications safely. Needs Instruction    Patient understands monitoring blood glucose, interpreting and using results Needs Review    Patient understands prevention,  detection, and treatment of acute complications. Needs Instruction    Patient understands prevention, detection, and treatment of chronic complications. Needs Instruction    Patient understands how to develop strategies to address psychosocial issues. Needs Instruction    Patient understands how to develop strategies to promote health/change behavior. Needs Instruction      Complications   Last HgB A1C per patient/outside source 9.2 %   02/09/2020   How often do you check your blood sugar? 1-2 times/day    Fasting Blood glucose range (mg/dL) 157-262;035-597;>416   Pt reports FBG's from 174-230 mg/dL.   Postprandial Blood glucose range (mg/dL) --   She reports bedtime readings average 170's mg/dL.   Have you had a dilated eye exam in the past 12 months? Yes    Have you had a dental exam in the past 12 months? Yes    Are you checking your feet? Yes    How many days per week are you checking your feet? 1      Dietary Intake   Breakfast oatmeal - flavored; eggs, grits    Snack (morning) 0-1 snacks/day - fruit (cantaloupe, honeydew, watermelon); frozen popcicle, cuccumbers and ranch, SF bread with peanut butter    Lunch homemade vegetable soup, banana sandwich or peanut butter and SF jelly sandwich on SF bread    Dinner chicken and occasional beef, pork; green beans, pinto beans, peas, broccoli, squash, zucchini, greens, salads with lettuce, tomatoes, cuccumbers, peppers    Beverage(s) water, hot  tea, coffee, seltzer waters that are SF      Exercise   Exercise Type ADL's      Patient Education   Previous Diabetes Education No    Disease state  Definition of diabetes, type 1 and 2, and the diagnosis of diabetes;Factors that contribute to the development of diabetes    Nutrition management  Role of diet in the treatment of diabetes and the relationship between the three main macronutrients and blood glucose level;Food label reading, portion sizes and measuring food.;Carbohydrate counting;Reviewed  blood glucose goals for pre and post meals and how to evaluate the patients' food intake on their blood glucose level.;Meal timing in regards to the patients' current diabetes medication.    Physical activity and exercise  Role of exercise on diabetes management, blood pressure control and cardiac health.    Medications Reviewed patients medication for diabetes, action, purpose, timing of dose and side effects.    Monitoring Purpose and frequency of SMBG.;Taught/discussed recording of test results and interpretation of SMBG.;Identified appropriate SMBG and/or A1C goals.    Chronic complications Relationship between chronic complications and blood glucose control    Psychosocial adjustment Identified and addressed patients feelings and concerns about diabetes      Individualized Goals (developed by patient)   Reducing Risk Other (comment)   improve blood sugars, decrase medications, prevent diabetes complications, lose weight, lead a healthier lifestyle, become more fit     Outcomes   Expected Outcomes Demonstrated interest in learning. Expect positive outcomes    Program Status Not Completed           Individualized Plan for Diabetes Self-Management Training:   Learning Objective:  Patient will have a greater understanding of diabetes self-management. Patient education plan is to attend individual and/or group sessions per assessed needs and concerns.   Plan:   Patient Instructions  Check blood sugars 2 x day before breakfast and 2 hrs after one meal every day  Exercise: Begin walking for 10  minutes  3  days a week and gradually increase to 30 minutes 5 x week  Eat 3 meals day, 1-2 snacks a day Space meals 4-6 hours apart  Call back if you want to schedule diabetes classes or an appointment with the dietitian.    Expected Outcomes:  Demonstrated interest in learning. Expect positive outcomes  Education material provided:  General Meal Planning Guidelines Simple Meal  Plan  If problems or questions, patient to contact team via:  Sharion Settler, RN, CCM, CDCES [phone]  Future DSME appointment:  The patient doesn't want to return for further Diabetes education at this time. She was offered classes or an additional appointment with the dietitian.

## 2020-02-22 NOTE — Patient Instructions (Signed)
Check blood sugars 2 x day before breakfast and 2 hrs after one meal every day  Exercise: Begin walking for 10  minutes  3  days a week and gradually increase to 30 minutes 5 x week  Eat 3 meals day, 1-2 snacks a day Space meals 4-6 hours apart  Call back if you want to schedule diabetes classes or an appointment with the dietitian.

## 2020-03-03 ENCOUNTER — Telehealth: Payer: Self-pay | Admitting: Internal Medicine

## 2020-03-03 DIAGNOSIS — I10 Essential (primary) hypertension: Secondary | ICD-10-CM

## 2020-03-03 MED ORDER — NEBIVOLOL HCL 5 MG PO TABS: ORAL_TABLET | ORAL | 1 refills | Status: DC

## 2020-03-03 NOTE — Telephone Encounter (Signed)
PT called regarding nebivolol (BYSTOLIC) 5 MG tablet stating her insurance will not cover this. PT requests a different option or generic, stated she only has 5 day supply left. Please advise.

## 2020-03-03 NOTE — Telephone Encounter (Signed)
Medication:  nebivolol (BYSTOLIC) 5 MG tablet [[id number]]   Has the patient contacted their pharmacy? Yes   (Agent: If yes, when and what did the pharmacy advise?) to call the office   Preferred Pharmacy (with phone number or street name):  [facility] - [place], [place] - [address]  [address], [place] [address]  Phone:  [phone] Fax:  [phone]  Agent: Please be advised that RX refills may take up to 3 business days. We ask that you follow-up with your pharmacy.

## 2020-03-06 ENCOUNTER — Encounter: Payer: Self-pay | Admitting: Internal Medicine

## 2020-03-06 ENCOUNTER — Other Ambulatory Visit: Payer: Self-pay

## 2020-03-06 ENCOUNTER — Other Ambulatory Visit: Payer: Self-pay | Admitting: Internal Medicine

## 2020-03-06 DIAGNOSIS — I10 Essential (primary) hypertension: Secondary | ICD-10-CM

## 2020-03-06 LAB — MICROALBUMIN / CREATININE URINE RATIO
Creatinine, Urine: 117 mg/dL
Microalb/Creat Ratio: 13 mg/g creat (ref 0–29)
Microalbumin, Urine: 14.8 ug/mL

## 2020-03-06 MED ORDER — NEBIVOLOL HCL 5 MG PO TABS: ORAL_TABLET | ORAL | 1 refills | Status: DC

## 2020-03-06 NOTE — Telephone Encounter (Signed)
Requested Prescriptions  Pending Prescriptions Disp Refills  . nebivolol (BYSTOLIC) 5 MG tablet 90 tablet 1    Sig: Take 1 tablet (5 mg total) by mouth daily.     Cardiovascular:  Beta Blockers Passed - 03/06/2020 11:22 AM      Passed - Last BP in normal range    BP Readings from Last 1 Encounters:  02/22/20 112/74         Passed - Last Heart Rate in normal range    Pulse Readings from Last 1 Encounters:  02/09/20 72         Passed - Valid encounter within last 6 months    Recent Outpatient Visits          3 weeks ago Annual physical exam   [facility] Reubin Milan, MD   7 months ago Essential hypertension   [facility] Reubin Milan, MD   1 year ago Annual physical exam   [facility] Reubin Milan, MD   2 years ago Vertigo   [facility] Reubin Milan, MD   2 years ago Annual physical exam   [facility] Reubin Milan, MD      Future Appointments            In 3 weeks Judithann Graves Nyoka Cowden, MD [facility], [facility]   In 11 months Judithann Graves Nyoka Cowden, MD [facility], [facility]

## 2020-03-06 NOTE — Telephone Encounter (Signed)
Refill was sent 03/03/2020.  KP

## 2020-03-06 NOTE — Telephone Encounter (Signed)
Medication: nebivolol (BYSTOLIC) 5 MG tablet [[id number]] - Patient is requesting this medication to the below pharmacy. Patient states that the medication is cheaper at the [facility] than at [facility] where it was previously sent on 03/03/20. Patient states she has 3 pills left.  Has the patient contacted their pharmacy? YES  (Agent: If no, request that the patient contact the pharmacy for the refill.) (Agent: If yes, when and what did the pharmacy advise?)  Preferred Pharmacy (with phone number or street name): [facility] [address] [place] [address] [phone]  Agent: Please be advised that RX refills may take up to 3 business days. We ask that you follow-up with your pharmacy.

## 2020-03-16 ENCOUNTER — Telehealth: Payer: Self-pay

## 2020-03-16 NOTE — Telephone Encounter (Unsigned)
Copied from CRM [id number]. Topic: Appointment Scheduling - Scheduling Inquiry for Clinic >> Mar 16, 2020  8:35 AM Wyonia Hough E wrote: Reason for CRM: pt called to schedule an appt for congestion, cough and sore throat/ Daisy advised she can come in tomorrow morning/ pt stated she didn't want to wait until tomorrow and hung up/ please advise

## 2020-03-16 NOTE — Telephone Encounter (Signed)
Noted   There are no available appts today. Pt refused appt for tomorrow 03/17/2020.  KP

## 2020-03-29 ENCOUNTER — Other Ambulatory Visit: Payer: Self-pay

## 2020-03-29 ENCOUNTER — Encounter: Payer: Self-pay | Admitting: Internal Medicine

## 2020-03-29 ENCOUNTER — Encounter: Payer: 59 | Admitting: Internal Medicine

## 2020-04-26 ENCOUNTER — Other Ambulatory Visit: Payer: Self-pay

## 2020-04-26 ENCOUNTER — Ambulatory Visit
Admission: RE | Admit: 2020-04-26 | Discharge: 2020-04-26 | Disposition: A | Discharge status: Home / Self Care | Payer: 59 | Source: Ambulatory Visit | Attending: Internal Medicine | Admitting: Internal Medicine

## 2020-04-26 DIAGNOSIS — Z1231 Encounter for screening mammogram for malignant neoplasm of breast: Secondary | ICD-10-CM | POA: Insufficient documentation

## 2020-05-05 ENCOUNTER — Ambulatory Visit (INDEPENDENT_AMBULATORY_CARE_PROVIDER_SITE_OTHER): Payer: 59 | Admitting: Internal Medicine

## 2020-05-05 ENCOUNTER — Encounter: Payer: Self-pay | Admitting: Internal Medicine

## 2020-05-05 ENCOUNTER — Other Ambulatory Visit: Payer: Self-pay

## 2020-05-05 VITALS — BP 132/74 | HR 71 | Ht 64.5 in | Wt 178.0 lb

## 2020-05-05 DIAGNOSIS — E118 Type 2 diabetes mellitus with unspecified complications: Secondary | ICD-10-CM

## 2020-05-05 DIAGNOSIS — D649 Anemia, unspecified: Secondary | ICD-10-CM | POA: Diagnosis not present

## 2020-05-05 DIAGNOSIS — E782 Mixed hyperlipidemia: Secondary | ICD-10-CM | POA: Diagnosis not present

## 2020-05-05 DIAGNOSIS — IMO0002 Reserved for concepts with insufficient information to code with codable children: Secondary | ICD-10-CM

## 2020-05-05 DIAGNOSIS — I10 Essential (primary) hypertension: Secondary | ICD-10-CM | POA: Diagnosis not present

## 2020-05-05 DIAGNOSIS — Z23 Encounter for immunization: Secondary | ICD-10-CM

## 2020-05-05 DIAGNOSIS — E1165 Type 2 diabetes mellitus with hyperglycemia: Secondary | ICD-10-CM

## 2020-05-05 LAB — HEMOGLOBIN A1C: Hemoglobin A1C: 6.7

## 2020-05-05 NOTE — Progress Notes (Signed)
Date:  05/05/2020   Name:  [name]   DOB:  1955/06/13   MRN:  [id number]   Chief Complaint: Diabetes  Diabetes She presents for her follow-up diabetic visit. She has type 2 diabetes mellitus. The initial diagnosis of diabetes was made 4 months ago. Her disease course has been improving. Pertinent negatives for hypoglycemia include no dizziness, headaches or tremors. Pertinent negatives for diabetes include no chest pain, no fatigue, no polydipsia and no polyuria. Current diabetic treatment includes diet (metofrmin was not started). She is compliant with treatment all of the time. Her weight is stable. She is following a generally healthy diet. Her breakfast blood glucose is taken between 7-8 am. Her breakfast blood glucose range is generally 130-140 mg/dl. Her dinner blood glucose is taken between 6-7 pm. Her dinner blood glucose range is generally 110-130 mg/dl. Her bedtime blood glucose is taken between 9-10 pm. Her bedtime blood glucose range is generally 130-140 mg/dl. An ACE inhibitor/angiotensin II receptor blocker is being taken.  Hyperlipidemia This is a chronic problem. The problem is uncontrolled. Pertinent negatives include no chest pain or shortness of breath. She is currently on no antihyperlipidemic treatment.  Anemia Presents for follow-up visit. There has been no abdominal pain, bruising/bleeding easily, fever, leg swelling, light-headedness or palpitations.  Last colonoscopy was done in 2014 was normal.  Lab Results  Component Value Date   CREATININE 0.70 02/09/2020   BUN 10 02/09/2020   NA 138 02/09/2020   K 4.3 02/09/2020   CL 100 02/09/2020   CO2 23 02/09/2020   Lab Results  Component Value Date   CHOL 198 02/09/2020   HDL 37 (L) 02/09/2020   LDLCALC 136 (H) 02/09/2020   LDLDIRECT 161.1 11/08/2010   TRIG 138 02/09/2020   CHOLHDL 5.4 (H) 02/09/2020   Lab Results  Component Value Date   TSH 1.590 02/09/2020   Lab Results  Component Value Date    HGBA1C 9.2 (H) 02/09/2020   Lab Results  Component Value Date   WBC 6.9 02/09/2020   HGB 10.6 (L) 02/09/2020   HCT 33.2 (L) 02/09/2020   MCV 84 02/09/2020   PLT 278 02/09/2020   Lab Results  Component Value Date   ALT 75 (H) 02/09/2020   AST 92 (H) 02/09/2020   ALKPHOS 135 (H) 02/09/2020   BILITOT 0.4 02/09/2020     Review of Systems  Constitutional: Negative for appetite change, fatigue, fever and unexpected weight change.  HENT: Negative for tinnitus and trouble swallowing.   Eyes: Negative for visual disturbance.  Respiratory: Negative for cough, chest tightness and shortness of breath.   Cardiovascular: Negative for chest pain, palpitations and leg swelling.  Gastrointestinal: Negative for abdominal pain and blood in stool.  Endocrine: Negative for polydipsia and polyuria.  Genitourinary: Negative for dysuria.  Musculoskeletal: Negative for arthralgias.  Neurological: Negative for dizziness, tremors, light-headedness, numbness and headaches.  Hematological: Does not bruise/bleed easily.  Psychiatric/Behavioral: Negative for dysphoric mood.    Patient Active Problem List   Diagnosis Date Noted  . S/P hysterectomy 02/09/2020  . Moderate episode of recurrent major depressive disorder (HCC) 02/09/2020  . Neuropathy 07/30/2019  . Osteopenia determined by x-ray 04/26/2019  . Mixed hyperlipidemia 07/15/2017  . Type II diabetes mellitus with complication, uncontrolled (HCC) 04/18/2017  . Vitamin D deficiency 04/18/2017  . Sleep apnea 02/28/2015  . Obesity 02/28/2015  . Routine general medical examination at a health care facility 08/04/2013  . Hemorrhoids 03/04/2012  . Insomnia 03/04/2012  .  Arthralgia 03/04/2012  . Migraine without aura or status migrainosus 03/04/2012  . Arthralgia of right temporomandibular joint 03/04/2012  . Essential hypertension 11/08/2010    Allergies  Allergen Reactions  . Amlodipine Swelling    And headache    Past Surgical History:   Procedure Laterality Date  . ABDOMINAL HYSTERECTOMY  2008   for Menorrhagia  . BREAST REDUCTION SURGERY    . LAPAROSCOPY    . REDUCTION MAMMAPLASTY Bilateral 1991    Social History   Tobacco Use  . Smoking status: Never Smoker  . Smokeless tobacco: Never Used  Vaping Use  . Vaping Use: Never used  Substance Use Topics  . Alcohol use: Not Currently    Comment: wine 2x a week  . Drug use: No     Medication list has been reviewed and updated.  Current Meds  Medication Sig  . diazepam (VALIUM) 5 MG tablet Take 1 tablet (5 mg total) by mouth every 6 (six) hours as needed for anxiety.  . DULoxetine (CYMBALTA) 30 MG capsule Take 1 capsule (30 mg total) by mouth daily. (Patient taking differently: Take 30 mg by mouth daily as needed.)  . DULoxetine (CYMBALTA) 60 MG capsule Take 1 capsule (60 mg total) by mouth daily.  Marland Kitchen glucose blood (ONETOUCH ULTRA) test strip Use to test blood sugar twice a day  . losartan-hydrochlorothiazide (HYZAAR) 100-12.5 MG tablet Take 1 tablet by mouth daily.  . metFORMIN (GLUCOPHAGE) 500 MG tablet Take 1 tablet (500 mg total) by mouth 2 (two) times daily with a meal.  . naproxen (NAPROSYN) 500 MG tablet Take 1 tablet (500 mg total) by mouth 2 (two) times daily with a meal.  . nebivolol (BYSTOLIC) 5 MG tablet Take 1 tablet (5 mg total) by mouth daily.  . SUMAtriptan (IMITREX) 100 MG tablet Take 1 tablet (100 mg total) by mouth daily as needed.  . SUMAtriptan Succinate 4 MG/0.5ML SOAJ Inject 4 mg into the skin daily as needed.  . Turmeric 500 MG CAPS Take 2 capsules by mouth daily.  . vitamin B-12 (CYANOCOBALAMIN) 1000 MCG tablet Take 1,000 mcg by mouth daily.  . Vitamin D, Ergocalciferol, 50 MCG (2000 UT) CAPS Take 2,000 Units by mouth daily.  Marland Kitchen zolpidem (AMBIEN) 10 MG tablet Take 1 tablet (10 mg total) by mouth at bedtime as needed for sleep. (Patient taking differently: Take 5 mg by mouth at bedtime as needed for sleep.)    PHQ 2/9 Scores 05/05/2020  03/29/2020 02/22/2020 02/09/2020  PHQ - 2 Score 0 0 0 4  PHQ- 9 Score 0 0 - 14    GAD 7 : Generalized Anxiety Score 03/29/2020 02/09/2020 07/28/2019  Nervous, Anxious, on Edge 0 2 1  Control/stop worrying 0 2 0  Worry too much - different things 0 2 0  Trouble relaxing 0 0 1  Restless 0 0 1  Easily annoyed or irritable 0 3 0  Afraid - awful might happen 0 2 0  Total GAD 7 Score 0 11 3  Anxiety Difficulty - - Not difficult at all    BP Readings from Last 3 Encounters:  05/05/20 132/74  02/22/20 112/74  02/09/20 106/72    Physical Exam Vitals and nursing note reviewed.  Constitutional:      General: She is not in acute distress.    Appearance: She is well-developed.  HENT:     Head: Normocephalic and atraumatic.  Cardiovascular:     Rate and Rhythm: Normal rate and regular rhythm.  Pulses: Normal pulses.     Heart sounds: No murmur heard.   Pulmonary:     Effort: Pulmonary effort is normal. No respiratory distress.  Musculoskeletal:     Cervical back: Normal range of motion.     Right lower leg: No edema.     Left lower leg: No edema.  Lymphadenopathy:     Cervical: No cervical adenopathy.  Skin:    General: Skin is warm and dry.     Findings: No rash.  Neurological:     Mental Status: She is alert and oriented to person, place, and time.  Psychiatric:        Mood and Affect: Mood normal.        Behavior: Behavior normal.     Wt Readings from Last 3 Encounters:  05/05/20 178 lb (80.7 kg)  02/22/20 188 lb 9.6 oz (85.5 kg)  02/09/20 192 lb (87.1 kg)    BP 132/74   Pulse 71   Ht 5' 4.5" (1.638 m)   Wt 178 lb (80.7 kg)   SpO2 97%   BMI 30.08 kg/m   Assessment and Plan: 1. Type II diabetes mellitus with complication, uncontrolled (HCC) Clinically stable by exam and report without s/s of hypoglycemia. DM complicated by HTN. Doing great with diet and weight loss A1C = 6.7 Recommend Eye exam Prevnar-20 next visit Continue diet regimen - Comprehensive  metabolic panel  2. Anemia, unspecified type Uncertain cause - will get FOBT and check labs If iron def will need to start supplements along with [place] - CBC with Differential/Platelet - Iron, TIBC and Ferritin Panel - Fecal occult blood, imunochemical  3. Essential hypertension Clinically stable exam with well controlled BP. Tolerating medications without side effects at this time. Pt to continue current regimen and low sodium diet; benefits of regular exercise as able discussed.  4. Mixed hyperlipidemia Will need to consider statin therapy   Partially dictated using Animal nutritionist. Any errors are unintentional.  Bari Edward, MD [facility]  05/05/2020

## 2020-05-06 LAB — CBC WITH DIFFERENTIAL/PLATELET
Basophils Absolute: 0.1 10*3/uL (ref 0.0–0.2)
Basos: 1 %
EOS (ABSOLUTE): 0.1 10*3/uL (ref 0.0–0.4)
Eos: 1 %
Hematocrit: 38.2 % (ref 34.0–46.6)
Hemoglobin: 11.7 g/dL (ref 11.1–15.9)
Immature Grans (Abs): 0 10*3/uL (ref 0.0–0.1)
Immature Granulocytes: 0 %
Lymphocytes Absolute: 2.2 10*3/uL (ref 0.7–3.1)
Lymphs: 22 %
MCH: 24.1 pg — ABNORMAL LOW (ref 26.6–33.0)
MCHC: 30.6 g/dL — ABNORMAL LOW (ref 31.5–35.7)
MCV: 79 fL (ref 79–97)
Monocytes Absolute: 0.8 10*3/uL (ref 0.1–0.9)
Monocytes: 8 %
Neutrophils Absolute: 7 10*3/uL (ref 1.4–7.0)
Neutrophils: 68 %
Platelets: 311 10*3/uL (ref 150–450)
RBC: 4.86 x10E6/uL (ref 3.77–5.28)
RDW: 14.6 % (ref 11.7–15.4)
WBC: 10.2 10*3/uL (ref 3.4–10.8)

## 2020-05-06 LAB — IRON,TIBC AND FERRITIN PANEL
Ferritin: 14 ng/mL — ABNORMAL LOW (ref 15–150)
Iron Saturation: 9 % — CL (ref 15–55)
Iron: 40 ug/dL (ref 27–139)
Total Iron Binding Capacity: 427 ug/dL (ref 250–450)
UIBC: 387 ug/dL — ABNORMAL HIGH (ref 118–369)

## 2020-05-06 LAB — COMPREHENSIVE METABOLIC PANEL
ALT: 25 IU/L (ref 0–32)
AST: 25 IU/L (ref 0–40)
Albumin/Globulin Ratio: 1.6 (ref 1.2–2.2)
Albumin: 4.7 g/dL (ref 3.8–4.8)
Alkaline Phosphatase: 137 IU/L — ABNORMAL HIGH (ref 44–121)
BUN/Creatinine Ratio: 28 (ref 12–28)
BUN: 20 mg/dL (ref 8–27)
Bilirubin Total: 0.3 mg/dL (ref 0.0–1.2)
CO2: 21 mmol/L (ref 20–29)
Calcium: 9.7 mg/dL (ref 8.7–10.3)
Chloride: 100 mmol/L (ref 96–106)
Creatinine, Ser: 0.72 mg/dL (ref 0.57–1.00)
Globulin, Total: 3 g/dL (ref 1.5–4.5)
Glucose: 111 mg/dL — ABNORMAL HIGH (ref 65–99)
Potassium: 4.6 mmol/L (ref 3.5–5.2)
Sodium: 139 mmol/L (ref 134–144)
Total Protein: 7.7 g/dL (ref 6.0–8.5)
eGFR: 94 mL/min/{1.73_m2} (ref >59)

## 2020-05-07 ENCOUNTER — Encounter: Payer: Self-pay | Admitting: Internal Medicine

## 2020-06-21 ENCOUNTER — Encounter: Payer: Self-pay | Admitting: Internal Medicine

## 2020-08-07 ENCOUNTER — Other Ambulatory Visit: Payer: Self-pay | Admitting: Internal Medicine

## 2020-08-07 DIAGNOSIS — I10 Essential (primary) hypertension: Secondary | ICD-10-CM

## 2020-08-07 NOTE — Telephone Encounter (Signed)
Future visit in 1  Month  

## 2020-09-04 ENCOUNTER — Other Ambulatory Visit: Payer: Self-pay | Admitting: Internal Medicine

## 2020-09-04 DIAGNOSIS — I10 Essential (primary) hypertension: Secondary | ICD-10-CM

## 2020-09-04 DIAGNOSIS — F5101 Primary insomnia: Secondary | ICD-10-CM

## 2020-09-04 NOTE — Telephone Encounter (Signed)
Requested medication (s) are due for refill today:  no  Requested medication (s) are on the active medication list:  yes   Last refill:  06/23/2020  Future visit scheduled: yes  Notes to clinic:  this refill cannot be delegated   Requested Prescriptions  Pending Prescriptions Disp Refills   zolpidem (AMBIEN) 10 MG tablet [Pharmacy Med Name: ZOLPIDEM TARTRATE 10 MG TABLET] 30 tablet 0    Sig: Take 1 tablet (10 mg total) by mouth at bedtime as needed for sleep.      Not Delegated - Psychiatry:  Anxiolytics/Hypnotics Failed - 09/04/2020  9:01 AM      Failed - This refill cannot be delegated      Failed - Urine Drug Screen completed in last 360 days      Passed - Valid encounter within last 6 months    Recent Outpatient Visits           4 months ago Type II diabetes mellitus with complication, uncontrolled [facility])   [facility] Reubin Milan, MD   6 months ago Annual physical exam   [facility] Reubin Milan, MD   1 year ago Essential hypertension   [facility] Reubin Milan, MD   1 year ago Annual physical exam   [facility] Reubin Milan, MD   2 years ago Vertigo   [facility] Reubin Milan, MD       Future Appointments             In 1 week Judithann Graves Nyoka Cowden, MD [facility], [facility]   In 5 months Judithann Graves Nyoka Cowden, MD [facility], [facility]

## 2020-09-04 NOTE — Telephone Encounter (Signed)
Requested Prescriptions  Pending Prescriptions Disp Refills  . BYSTOLIC 5 MG tablet [Pharmacy Med Name: Bystolic 5 MG Oral Tablet] 90 tablet 0    Sig: Take 1 tablet by mouth once daily     Cardiovascular:  Beta Blockers Passed - 09/04/2020  9:00 AM      Passed - Last BP in normal range    BP Readings from Last 1 Encounters:  05/05/20 132/74         Passed - Last Heart Rate in normal range    Pulse Readings from Last 1 Encounters:  05/05/20 71         Passed - Valid encounter within last 6 months    Recent Outpatient Visits          4 months ago Type II diabetes mellitus with complication, uncontrolled (HCC)   [facility] Reubin Milan, MD   6 months ago Annual physical exam   [facility] Reubin Milan, MD   1 year ago Essential hypertension   [facility] Reubin Milan, MD   1 year ago Annual physical exam   [facility] Reubin Milan, MD   2 years ago Vertigo   [facility] Reubin Milan, MD      Future Appointments            In 1 week Judithann Graves Nyoka Cowden, MD [facility], [facility]   In 5 months Judithann Graves, Nyoka Cowden, MD [facility], [facility]

## 2020-09-04 NOTE — Telephone Encounter (Signed)
Please review. Last office visit 05/05/2020.  KP

## 2020-09-05 ENCOUNTER — Telehealth: Payer: Self-pay

## 2020-09-05 NOTE — Telephone Encounter (Signed)
Completed PA on [email or web address] for Bystolic.   Key: [id number]  PA Case ID: [id number]  Rx #: [id number]

## 2020-09-06 NOTE — Telephone Encounter (Signed)
Pt called to speak with Nurse about her BYSTOLIC 5 MG tablet  Pt states she is having a hrad time getting this medication / please advise

## 2020-09-07 NOTE — Telephone Encounter (Signed)
PA Approved.  The authorization is effective from 09/05/2020 to 09/04/2021

## 2020-09-07 NOTE — Telephone Encounter (Signed)
Gave patient a 7 day tablet box of 10 mg. Told her to take 1/2 a tablet daily until her appt 08/19 and then Dr. Judithann Graves and her can discuss any changes that need to be made for cost purposes and htn.

## 2020-09-07 NOTE — Telephone Encounter (Signed)
Patient informed of approved PA. She said she called pharmacy and they said she now has to pay a $300 deductible and she said she cannot pay.

## 2020-09-07 NOTE — Telephone Encounter (Signed)
Called pt left VM that PA was approved.  KP

## 2020-09-15 ENCOUNTER — Other Ambulatory Visit: Payer: Self-pay

## 2020-09-15 ENCOUNTER — Encounter: Payer: Self-pay | Admitting: Internal Medicine

## 2020-09-15 ENCOUNTER — Telehealth: Payer: Self-pay | Admitting: Internal Medicine

## 2020-09-15 ENCOUNTER — Ambulatory Visit (INDEPENDENT_AMBULATORY_CARE_PROVIDER_SITE_OTHER): Payer: 59 | Admitting: Internal Medicine

## 2020-09-15 VITALS — BP 130/80 | HR 64 | Temp 97.9°F | Ht 64.5 in | Wt 180.0 lb

## 2020-09-15 DIAGNOSIS — Z23 Encounter for immunization: Secondary | ICD-10-CM

## 2020-09-15 DIAGNOSIS — E1165 Type 2 diabetes mellitus with hyperglycemia: Secondary | ICD-10-CM | POA: Diagnosis not present

## 2020-09-15 DIAGNOSIS — I10 Essential (primary) hypertension: Secondary | ICD-10-CM

## 2020-09-15 DIAGNOSIS — E118 Type 2 diabetes mellitus with unspecified complications: Secondary | ICD-10-CM | POA: Diagnosis not present

## 2020-09-15 DIAGNOSIS — E611 Iron deficiency: Secondary | ICD-10-CM

## 2020-09-15 DIAGNOSIS — IMO0002 Reserved for concepts with insufficient information to code with codable children: Secondary | ICD-10-CM

## 2020-09-15 LAB — POCT GLYCOSYLATED HEMOGLOBIN (HGB A1C): Hemoglobin A1C: 6.3 % — AB (ref 4.0–5.6)

## 2020-09-15 MED ORDER — BYSTOLIC 5 MG PO TABS
ORAL_TABLET | ORAL | 3 refills | Status: DC
Start: 2020-09-15 — End: 2021-03-07

## 2020-09-15 NOTE — Progress Notes (Signed)
Date:  09/15/2020   Name:  [name]   DOB:  August 03, 1955   MRN:  [id number]   Chief Complaint: Diabetes (Last BS 120 this AM) and Hypertension  Diabetes She presents for her follow-up diabetic visit. She has type 2 diabetes mellitus. Her disease course has been improving. Pertinent negatives for hypoglycemia include no headaches or tremors. Associated symptoms include fatigue (has days with less energy, increased hunger for unclear reasons). Pertinent negatives for diabetes include no chest pain, no polydipsia and no polyuria. Current diabetic treatment includes diet. She is compliant with treatment most of the time. Her weight is decreasing steadily. She is following a diabetic diet. An ACE inhibitor/angiotensin II receptor blocker is being taken.  Hypertension This is a chronic problem. The problem is controlled. Pertinent negatives include no chest pain, headaches, palpitations or shortness of breath. Past treatments include angiotensin blockers, beta blockers and diuretics. The current treatment provides significant improvement.   Lab Results  Component Value Date   CREATININE 0.72 05/05/2020   BUN 20 05/05/2020   NA 139 05/05/2020   K 4.6 05/05/2020   CL 100 05/05/2020   CO2 21 05/05/2020   Lab Results  Component Value Date   CHOL 198 02/09/2020   HDL 37 (L) 02/09/2020   LDLCALC 136 (H) 02/09/2020   LDLDIRECT 161.1 11/08/2010   TRIG 138 02/09/2020   CHOLHDL 5.4 (H) 02/09/2020   Lab Results  Component Value Date   TSH 1.590 02/09/2020   Lab Results  Component Value Date   HGBA1C 6.7 05/05/2020   Lab Results  Component Value Date   WBC 10.2 05/05/2020   HGB 11.7 05/05/2020   HCT 38.2 05/05/2020   MCV 79 05/05/2020   PLT 311 05/05/2020   Lab Results  Component Value Date   ALT 25 05/05/2020   AST 25 05/05/2020   ALKPHOS 137 (H) 05/05/2020   BILITOT 0.3 05/05/2020     Review of Systems  Constitutional:  Positive for fatigue (has days with less  energy, increased hunger for unclear reasons). Negative for appetite change, fever and unexpected weight change.  HENT:  Negative for tinnitus and trouble swallowing.   Eyes:  Negative for visual disturbance.  Respiratory:  Negative for cough, chest tightness and shortness of breath.   Cardiovascular:  Negative for chest pain, palpitations and leg swelling.  Gastrointestinal:  Negative for abdominal pain.  Endocrine: Negative for polydipsia and polyuria.  Genitourinary:  Negative for dysuria and hematuria.  Musculoskeletal:  Negative for arthralgias.  Neurological:  Negative for tremors, numbness and headaches.  Psychiatric/Behavioral:  Negative for dysphoric mood.    Patient Active Problem List   Diagnosis Date Noted   S/P hysterectomy 02/09/2020   Moderate episode of recurrent major depressive disorder (HCC) 02/09/2020   Neuropathy 07/30/2019   Osteopenia determined by x-ray 04/26/2019   Mixed hyperlipidemia 07/15/2017   Type II diabetes mellitus with complication, uncontrolled (HCC) 04/18/2017   Vitamin D deficiency 04/18/2017   Sleep apnea 02/28/2015   Obesity 02/28/2015   Routine general medical examination at a health care facility 08/04/2013   Hemorrhoids 03/04/2012   Insomnia 03/04/2012   Arthralgia 03/04/2012   Migraine without aura or status migrainosus 03/04/2012   Arthralgia of right temporomandibular joint 03/04/2012   Essential hypertension 11/08/2010    Allergies  Allergen Reactions   Amlodipine Swelling    And headache    Past Surgical History:  Procedure Laterality Date   ABDOMINAL HYSTERECTOMY  2008   for Menorrhagia  BREAST REDUCTION SURGERY     LAPAROSCOPY     REDUCTION MAMMAPLASTY Bilateral 1991    Social History   Tobacco Use   Smoking status: Never   Smokeless tobacco: Never  Vaping Use   Vaping Use: Never used  Substance Use Topics   Alcohol use: Not Currently    Comment: wine 2x a week   Drug use: No     Medication list has been  reviewed and updated.  Current Meds  Medication Sig   BYSTOLIC 5 MG tablet Take 1 tablet by mouth once daily   diazepam (VALIUM) 5 MG tablet Take 1 tablet (5 mg total) by mouth every 6 (six) hours as needed for anxiety.   DULoxetine (CYMBALTA) 30 MG capsule Take 1 capsule (30 mg total) by mouth daily. (Patient taking differently: Take 30 mg by mouth daily as needed.)   DULoxetine (CYMBALTA) 60 MG capsule Take 1 capsule (60 mg total) by mouth daily.   Ferrous Sulfate (IRON PO) Take by mouth.   glucose blood (ONETOUCH ULTRA) test strip Use to test blood sugar twice a day   losartan-hydrochlorothiazide (HYZAAR) 100-12.5 MG tablet Take 1 tablet by mouth daily.   naproxen (NAPROSYN) 500 MG tablet Take 1 tablet (500 mg total) by mouth 2 (two) times daily with a meal.   SUMAtriptan (IMITREX) 100 MG tablet Take 1 tablet (100 mg total) by mouth daily as needed.   SUMAtriptan Succinate 4 MG/0.5ML SOAJ Inject 4 mg into the skin daily as needed.   Vitamin D, Ergocalciferol, 50 MCG (2000 UT) CAPS Take 2,000 Units by mouth daily.   zolpidem (AMBIEN) 10 MG tablet Take 1 tablet (10 mg total) by mouth at bedtime as needed for sleep. (Patient taking differently: Take 5 mg by mouth daily.)    PHQ 2/9 Scores 09/15/2020 05/05/2020 03/29/2020 02/22/2020  PHQ - 2 Score 1 0 0 0  PHQ- 9 Score 6 0 0 -    GAD 7 : Generalized Anxiety Score 09/15/2020 03/29/2020 02/09/2020 07/28/2019  Nervous, Anxious, on Edge 1 0 2 1  Control/stop worrying 0 0 2 0  Worry too much - different things 0 0 2 0  Trouble relaxing 1 0 0 1  Restless 0 0 0 1  Easily annoyed or irritable 1 0 3 0  Afraid - awful might happen 0 0 2 0  Total GAD 7 Score 3 0 11 3  Anxiety Difficulty - - - Not difficult at all    BP Readings from Last 3 Encounters:  09/15/20 130/80  05/05/20 132/74  02/22/20 112/74    Physical Exam Vitals and nursing note reviewed.  Constitutional:      General: She is not in acute distress.    Appearance: Normal appearance.  She is well-developed.  HENT:     Head: Normocephalic and atraumatic.  Neck:     Vascular: No carotid bruit.  Cardiovascular:     Rate and Rhythm: Normal rate and regular rhythm.     Pulses: Normal pulses.  Pulmonary:     Effort: Pulmonary effort is normal. No respiratory distress.     Breath sounds: No wheezing or rhonchi.  Musculoskeletal:     Cervical back: Normal range of motion.     Right lower leg: No edema.     Left lower leg: No edema.  Lymphadenopathy:     Cervical: No cervical adenopathy.  Skin:    General: Skin is warm and dry.     Findings: No rash.  Neurological:  Mental Status: She is alert and oriented to person, place, and time.  Psychiatric:        Mood and Affect: Mood normal.        Behavior: Behavior normal.    Wt Readings from Last 3 Encounters:  09/15/20 180 lb (81.6 kg)  05/05/20 178 lb (80.7 kg)  02/22/20 188 lb 9.6 oz (85.5 kg)    BP 130/80   Pulse 64   Temp 97.9 F (36.6 C) (Oral)   Ht 5' 4.5" (1.638 m)   Wt 180 lb (81.6 kg)   SpO2 98%   BMI 30.42 kg/m   Assessment and Plan: 1. Type II diabetes mellitus with complication, uncontrolled (HCC) Clinically stable by exam and report without s/s of hypoglycemia. DM complicated by hypertension and dyslipidemia. Doing very well with diet and exercise - no medication at this time  - POCT HgB A1C = 6.3 down from 6.7  2. Essential hypertension Clinically stable exam with well controlled BP. Tolerating medications without side effects at this time. Pt to continue current regimen and low sodium diet; benefits of regular exercise as able discussed. - BYSTOLIC 5 MG tablet; Take 1 tablet by mouth once daily  Dispense: 90 tablet; Refill: 3  3. Need for pneumococcal vaccine - Pneumococcal conjugate vaccine 20-valent (Prevnar 20)  4. Iron deficiency She is on a Keto diet and does not consume a variety of meat and no red meat - discussed more variety with fish, chicken and pork.  She is also on  iron supplements and her CBC has been adequate to donate RBCs. Continue iron supplement daily Will obtain labs next visit   Partially dictated using Animal nutritionist. Any errors are unintentional.  Bari Edward, MD [facility]  09/15/2020

## 2020-09-15 NOTE — Telephone Encounter (Signed)
Pt wants to know if Dr Asencion Partridge can give her another 10 day sample supply of  BYSTOLIC 5 MG tablet  He says it will be another 10 days before she can pick up her prescription at the pharmacy. [name]

## 2020-09-15 NOTE — Telephone Encounter (Signed)
Called pt left her know that we have Bystolic 10 MG that she would need to cut the tablet in half. Pt verbalized understanding and will come and pick up the sample.  KP

## 2020-09-18 LAB — HM DIABETES EYE EXAM

## 2020-09-22 ENCOUNTER — Encounter: Payer: Self-pay | Admitting: Internal Medicine

## 2020-09-22 NOTE — Telephone Encounter (Signed)
Patient called in to inform Dr Judithann Graves that if she get any request from any pharmacies especially [facility] please do not send any Rx to them. Any questions or concerns please call Ph# [phone]

## 2020-09-22 NOTE — Telephone Encounter (Signed)
Noted  KP 

## 2020-09-28 ENCOUNTER — Other Ambulatory Visit: Payer: Self-pay

## 2020-09-28 ENCOUNTER — Ambulatory Visit (INDEPENDENT_AMBULATORY_CARE_PROVIDER_SITE_OTHER): Payer: 59 | Admitting: Family Medicine

## 2020-09-28 ENCOUNTER — Ambulatory Visit: Payer: Self-pay | Admitting: *Deleted

## 2020-09-28 ENCOUNTER — Encounter: Payer: Self-pay | Admitting: Family Medicine

## 2020-09-28 VITALS — BP 120/74 | HR 66 | Temp 97.8°F | Ht 64.5 in | Wt 180.0 lb

## 2020-09-28 DIAGNOSIS — R21 Rash and other nonspecific skin eruption: Secondary | ICD-10-CM | POA: Diagnosis not present

## 2020-09-28 MED ORDER — METHYLPREDNISOLONE 4 MG PO TBPK: ORAL_TABLET | ORAL | 0 refills | Status: DC

## 2020-09-28 NOTE — Progress Notes (Signed)
Primary Care / Sports Medicine Office Visit  Patient Information:  Patient ID: CYNTIA STALEY, female DOB: [birth year] Age: 65 y.o. MRN: [id number]   [name] is a pleasant 65 y.o. female presenting with the following:  Chief Complaint  Patient presents with   Rash    Reports she started breaking out in a rash x 2 days ago; started on her bottom and now on her chest, stomach, leg and hand; small red raised bumps; Itching associated; started generic form of her BP medication (Bystolic) about the same time.    Review of Systems pertinent details above   Patient Active Problem List   Diagnosis Date Noted   Rash of entire body 09/28/2020   S/P hysterectomy 02/09/2020   Moderate episode of recurrent major depressive disorder (HCC) 02/09/2020   Neuropathy 07/30/2019   Osteopenia determined by x-ray 04/26/2019   Mixed hyperlipidemia 07/15/2017   Type II diabetes mellitus with complication, uncontrolled (HCC) 04/18/2017   Vitamin D deficiency 04/18/2017   Sleep apnea 02/28/2015   Obesity 02/28/2015   Routine general medical examination at a health care facility 08/04/2013   Hemorrhoids 03/04/2012   Insomnia 03/04/2012   Arthralgia 03/04/2012   Migraine without aura or status migrainosus 03/04/2012   Arthralgia of right temporomandibular joint 03/04/2012   Essential hypertension 11/08/2010   Past Medical History:  Diagnosis Date   Depression    Diabetes mellitus without complication (HCC)    Hypertension    Outpatient Encounter Medications as of 09/28/2020  Medication Sig   BYSTOLIC 5 MG tablet Take 1 tablet by mouth once daily   diazepam (VALIUM) 5 MG tablet Take 1 tablet (5 mg total) by mouth every 6 (six) hours as needed for anxiety.   DULoxetine (CYMBALTA) 30 MG capsule Take 1 capsule (30 mg total) by mouth daily. (Patient taking differently: Take 30 mg by mouth daily as needed.)   DULoxetine (CYMBALTA) 60 MG capsule Take 1 capsule (60 mg total) by mouth daily.    Ferrous Sulfate (IRON PO) Take 1 tablet by mouth daily.   glucose blood (ONETOUCH ULTRA) test strip Use to test blood sugar twice a day   losartan-hydrochlorothiazide (HYZAAR) 100-12.5 MG tablet Take 1 tablet by mouth daily.   naproxen (NAPROSYN) 500 MG tablet Take 1 tablet (500 mg total) by mouth 2 (two) times daily with a meal.   SUMAtriptan (IMITREX) 100 MG tablet Take 1 tablet (100 mg total) by mouth daily as needed.   SUMAtriptan Succinate 4 MG/0.5ML SOAJ Inject 4 mg into the skin daily as needed.   Vitamin D, Ergocalciferol, 50 MCG (2000 UT) CAPS Take 2,000 Units by mouth daily.   zolpidem (AMBIEN) 10 MG tablet Take 1 tablet (10 mg total) by mouth at bedtime as needed for sleep. (Patient taking differently: Take 5 mg by mouth at bedtime as needed for sleep.)   [DISCONTINUED] methylPREDNISolone (MEDROL DOSEPAK) 4 MG TBPK tablet Day 1: Take 6 tablets Day 2: Take 5 tablets Day 3: Take 4 tablets Day 4: Take 3 tablets Day 5: Take 2 tablets Day 6: Take 1 tablet   methylPREDNISolone (MEDROL DOSEPAK) 4 MG TBPK tablet Day 1: Take 6 tablets Day 2: Take 5 tablets Day 3: Take 4 tablets Day 4: Take 3 tablets Day 5: Take 2 tablets Day 6: Take 1 tablet   No facility-administered encounter medications on file as of 09/28/2020.   Past Surgical History:  Procedure Laterality Date   ABDOMINAL HYSTERECTOMY  2008   for  Menorrhagia   BREAST REDUCTION SURGERY     LAPAROSCOPY     REDUCTION MAMMAPLASTY Bilateral 1991    Vitals:   09/28/20 1622  BP: 120/74  Pulse: 66  Temp: 97.8 F (36.6 C)  SpO2: 97%   Vitals:   09/28/20 1622  Weight: 180 lb (81.6 kg)  Height: 5' 4.5" (1.638 m)   Body mass index is 30.42 kg/m.  No results found.   Independent interpretation of notes and tests performed by another provider:   None  Procedures performed:   None  Pertinent History, Exam, Impression, and Recommendations:   Rash of entire body Patient presents with progressive full body rash  noted over the past 2 days, denies any specific exposure history though does have cats and dogs with some animal sleeping in the same bed.  Additionally, she reports recent change in her Bystolic from brand name to generic nebivolol, uncertain if related.  She denies any issues with throat tightness, respiration, vision change, lightheadedness, dizziness, or chest pain.  He does state that 2 of her dogs have had recent urinary tract infections.  Rash began at the left posterior upper hip/gluteal region, has progressed to the back, torso, and now encompasses both halves of the body involving the extremities, and right face.  Mucosal regions and eyes are spared.  Examination reveals nontoxic-appearing individual with scattered mildly erythematous nodular lesions on nonerythematous base.  Oropharynx and nares benign, benign cardiopulmonary examination.  Given the distribution of symptoms concern for allergic reaction related rash, specific causatory agent uncertain.  I did advise course of prednisone though patient is currently not amenable to this, in its place patient will dose scheduled nightly diphenhydramine, a.m. loratadine, and have low threshold to initiate prednisone if symptoms progressively worsen.  If any respiratory compromise noted, she is advised to seek emergent medical attention.  Additionally, she is to hold from [facility] given her reassuring vitals today and contact us on Tuesday when our office has reopened for a status update.  If persistent symptomatology noted, we will revisit oral prednisone, can also consider possible antibiotic/antiviral therapies.   Orders & Medications Meds ordered this encounter  Medications   DISCONTD: methylPREDNISolone (MEDROL DOSEPAK) 4 MG TBPK tablet    Sig: Day 1: Take 6 tablets Day 2: Take 5 tablets Day 3: Take 4 tablets Day 4: Take 3 tablets Day 5: Take 2 tablets Day 6: Take 1 tablet    Dispense:  21 tablet    Refill:  0   methylPREDNISolone  (MEDROL DOSEPAK) 4 MG TBPK tablet    Sig: Day 1: Take 6 tablets Day 2: Take 5 tablets Day 3: Take 4 tablets Day 4: Take 3 tablets Day 5: Take 2 tablets Day 6: Take 1 tablet    Dispense:  21 tablet    Refill:  0   No orders of the defined types were placed in this encounter.    Return if symptoms worsen or fail to improve.     Jerrol Banana, MD   Primary Care Sports Medicine [facility]

## 2020-09-28 NOTE — Assessment & Plan Note (Addendum)
Patient presents with progressive full body rash noted over the past 2 days, denies any specific exposure history though does have cats and dogs with some animal sleeping in the same bed.  Additionally, she reports recent change in her Bystolic from brand name to generic nebivolol, uncertain if related.  She denies any issues with throat tightness, respiration, vision change, lightheadedness, dizziness, or chest pain.  He does state that 2 of her dogs have had recent urinary tract infections.  Rash began at the left posterior upper hip/gluteal region, has progressed to the back, torso, and now encompasses both halves of the body involving the extremities, and right face.  Mucosal regions and eyes are spared.  Examination reveals nontoxic-appearing individual with scattered mildly erythematous nodular lesions on nonerythematous base.  Oropharynx and nares benign, benign cardiopulmonary examination.  Given the distribution of symptoms concern for allergic reaction related rash, specific causatory agent uncertain.  I did advise course of prednisone though patient is currently not amenable to this, in its place patient will dose scheduled nightly diphenhydramine, a.m. loratadine, and have low threshold to initiate prednisone if symptoms progressively worsen.  If any respiratory compromise noted, she is advised to seek emergent medical attention.  Additionally, she is to hold from [facility] given her reassuring vitals today and contact us on Tuesday when our office has reopened for a status update.  If persistent symptomatology noted, we will revisit oral prednisone, can also consider possible antibiotic/antiviral therapies.

## 2020-09-28 NOTE — Patient Instructions (Signed)
-   Dose 50 mg diphenhydramine (Benadryl) nightly - Dose loratadine (Claritin) 10 mg daily - Hold from Bystolic - If any further worsening symptoms particularly including difficulty swallowing, start prednisone course - If any difficulty with breathing or persistent symptomatology noted, seek emergent medical attention - Contact our office next Tuesday 9/6 to provide a status update - Contact for any questions between now and then

## 2020-09-28 NOTE — Telephone Encounter (Signed)
Pt. Reports she started breaking out in a rash 2 days ago. Started on her bottom and now on her chest, stomach, leg and hand. Small red raised bumps. Itchy. Started generic form of her BP medication about the same time. Warm transfer to [facility] in the practice.    Reason for Disposition  SEVERE itching (i.e., interferes with sleep, normal activities or school)  Answer Assessment - Initial Assessment Questions 1. APPEARANCE of RASH: "Describe the rash." (e.g., spots, blisters, raised areas, skin peeling, scaly)     Red raised bumps 2. SIZE: "How big are the spots?" (e.g., tip of pen, eraser, coin; inches, centimeters)     Tip of pen 3. LOCATION: "Where is the rash located?"     Chest , stomach, leg and left hand 4. COLOR: "What color is the rash?" (Note: It is difficult to assess rash color in people with darker-colored skin. When this situation occurs, simply ask the caller to describe what they see.)     Red 5. ONSET: "When did the rash begin?"     Yesterday 6. FEVER: "Do you have a fever?" If Yes, ask: "What is your temperature, how was it measured, and when did it start?"     No 7. ITCHING: "Does the rash itch?" If Yes, ask: "How bad is the itch?" (Scale 1-10; or mild, moderate, severe)     Now - 2 8. CAUSE: "What do you think is causing the rash?"     Unsure 9. MEDICINE FACTORS: "Have you started any new medicines within the last 2 weeks?" (e.g., antibiotics)      BP meds taking generic form 10. OTHER SYMPTOMS: "Do you have any other symptoms?" (e.g., dizziness, headache, sore throat, joint pain)       No 11. PREGNANCY: "Is there any chance you are pregnant?" "When was your last menstrual period?"       No  Protocols used: Rash or Redness - [facility]

## 2020-09-28 NOTE — Telephone Encounter (Signed)
Patient called in to say that she stated with a small area on her torso with itchy bumpy rash on 09/27/20 but that upon waking up this morning the rash have spread to many different areas of her body. Per patient the only change she made was to the generic form of a medication and think this may be the cause but not so sure. Asking for a call back please at  Ph# [phone]   Attempted to call patient- left message to call office.

## 2020-10-07 ENCOUNTER — Encounter: Payer: Self-pay | Admitting: *Deleted

## 2020-10-07 ENCOUNTER — Emergency Department: Payer: 59

## 2020-10-07 ENCOUNTER — Other Ambulatory Visit: Payer: Self-pay

## 2020-10-07 ENCOUNTER — Emergency Department
Admission: EM | Admit: 2020-10-07 | Discharge: 2020-10-08 | Disposition: A | Discharge status: Home / Self Care | Payer: 59 | Attending: Emergency Medicine | Admitting: Emergency Medicine

## 2020-10-07 DIAGNOSIS — I10 Essential (primary) hypertension: Secondary | ICD-10-CM | POA: Diagnosis not present

## 2020-10-07 DIAGNOSIS — Q7191 Unspecified reduction defect of right upper limb: Secondary | ICD-10-CM

## 2020-10-07 DIAGNOSIS — S62101A Fracture of unspecified carpal bone, right wrist, initial encounter for closed fracture: Secondary | ICD-10-CM

## 2020-10-07 DIAGNOSIS — S6291XA Unspecified fracture of right wrist and hand, initial encounter for closed fracture: Secondary | ICD-10-CM | POA: Insufficient documentation

## 2020-10-07 DIAGNOSIS — E119 Type 2 diabetes mellitus without complications: Secondary | ICD-10-CM | POA: Diagnosis not present

## 2020-10-07 DIAGNOSIS — Z79899 Other long term (current) drug therapy: Secondary | ICD-10-CM | POA: Diagnosis not present

## 2020-10-07 DIAGNOSIS — M25531 Pain in right wrist: Secondary | ICD-10-CM | POA: Insufficient documentation

## 2020-10-07 DIAGNOSIS — S6991XA Unspecified injury of right wrist, hand and finger(s), initial encounter: Secondary | ICD-10-CM | POA: Diagnosis present

## 2020-10-07 DIAGNOSIS — W010XXA Fall on same level from slipping, tripping and stumbling without subsequent striking against object, initial encounter: Secondary | ICD-10-CM | POA: Diagnosis not present

## 2020-10-07 MED ORDER — MORPHINE SULFATE (PF) 4 MG/ML IV SOLN
6.0000 mg | Freq: Once | INTRAVENOUS | Status: AC
  Administered 2020-10-07: 6 mg via INTRAVENOUS
  Filled 2020-10-07: qty 2

## 2020-10-07 MED ORDER — ONDANSETRON HCL 4 MG/2ML IJ SOLN
4.0000 mg | Freq: Once | INTRAMUSCULAR | Status: AC
  Administered 2020-10-07: 4 mg via INTRAVENOUS
  Filled 2020-10-07: qty 2

## 2020-10-07 MED ORDER — BUPIVACAINE HCL (PF) 0.5 % IJ SOLN
50.0000 mL | Freq: Once | INTRAMUSCULAR | Status: AC
  Administered 2020-10-07: 30 mL
  Filled 2020-10-07 (×2): qty 60

## 2020-10-07 MED ORDER — OXYCODONE-ACETAMINOPHEN 5-325 MG PO TABS: 1.0000 | ORAL_TABLET | Freq: Four times a day (QID) | ORAL | 0 refills | Status: AC | PRN

## 2020-10-07 NOTE — ED Triage Notes (Signed)
First RN Note: Pt to ED via POV, states slipped on wet pavement and landed on R arm. Pt with splint applied to R forearm by EMS PTA arrived in ED. Pt A&O x4, ambulatory to triage desk. Pt c/o severe pain to R arm at this time.

## 2020-10-07 NOTE — ED Triage Notes (Signed)
Pt ambulatory with steady gait, says that she slipped and fell. She has right wrist pain, swelling. Left knee abrasion and left toes avulsion.

## 2020-10-07 NOTE — ED Provider Notes (Signed)
[facility] Emergency Department Provider Note  ____________________________________________   Event Date/Time   First MD Initiated Contact with Patient 10/07/20 2128     (approximate)  I have reviewed the triage vital signs and the nursing notes.   HISTORY  Chief Complaint Arm Pain   HPI [name] is a 65 y.o. female with a past medical history of HTN, DM and depression who presents for assessment of acute right wrist pain that she experienced when she fell after slipping in the rain immediately prior to arrival.  Patient states she was falling backwards and reached around and fell on her outstretched hand.  She states she also scraped her knee but has been able to bear weight and this time he significant pain at the knee.  She did not hit her head and has no neck pain, chest pain, back pain, abdominal pain or pain in her left wrist or bilateral shoulders, elbows, hips knees or ankles.  Patient is other than scraping her left knee shows I think she injured her self anywhere else.  No other recent falls or injuries.  She is not on any blood thinners.  She is otherwise been in her usual state of health without any recent fevers, chills, cough, nausea, vomiting, diarrhea, dysuria, rash or other acute sick symptoms.         Past Medical History:  Diagnosis Date   Depression    Diabetes mellitus without complication (HCC)    Hypertension     Patient Active Problem List   Diagnosis Date Noted   Rash of entire body 09/28/2020   S/P hysterectomy 02/09/2020   Moderate episode of recurrent major depressive disorder (HCC) 02/09/2020   Neuropathy 07/30/2019   Osteopenia determined by x-ray 04/26/2019   Mixed hyperlipidemia 07/15/2017   Type II diabetes mellitus with complication, uncontrolled (HCC) 04/18/2017   Vitamin D deficiency 04/18/2017   Sleep apnea 02/28/2015   Obesity 02/28/2015   Routine general medical examination at a health care facility  08/04/2013   Hemorrhoids 03/04/2012   Insomnia 03/04/2012   Arthralgia 03/04/2012   Migraine without aura or status migrainosus 03/04/2012   Arthralgia of right temporomandibular joint 03/04/2012   Essential hypertension 11/08/2010    Past Surgical History:  Procedure Laterality Date   ABDOMINAL HYSTERECTOMY  2008   for Menorrhagia   BREAST REDUCTION SURGERY     LAPAROSCOPY     REDUCTION MAMMAPLASTY Bilateral 1991    Prior to Admission medications   Medication Sig Start Date End Date Taking? Authorizing Provider  oxyCODONE-acetaminophen (PERCOCET) 5-325 MG tablet Take 1 tablet by mouth every 6 (six) hours as needed for up to 5 days for severe pain. 10/07/20 10/12/20 Yes Gilles ChiquitoSmith, Edvin Albus P, MD  BYSTOLIC 5 MG tablet Take 1 tablet by mouth once daily 09/15/20   Reubin MilanBerglund, Laura H, MD  diazepam (VALIUM) 5 MG tablet Take 1 tablet (5 mg total) by mouth every 6 (six) hours as needed for anxiety. 02/09/20   Reubin MilanBerglund, Laura H, MD  DULoxetine (CYMBALTA) 30 MG capsule Take 1 capsule (30 mg total) by mouth daily. Patient taking differently: Take 30 mg by mouth daily as needed. 02/09/20   Reubin MilanBerglund, Laura H, MD  DULoxetine (CYMBALTA) 60 MG capsule Take 1 capsule (60 mg total) by mouth daily. 02/09/20   Reubin MilanBerglund, Laura H, MD  Ferrous Sulfate (IRON PO) Take 1 tablet by mouth daily.    [provider]  glucose blood (ONETOUCH ULTRA) test strip Use to test blood  sugar twice a day 02/15/20   Reubin Milan, MD  losartan-hydrochlorothiazide [facility]) 100-12.5 MG tablet Take 1 tablet by mouth daily. 08/07/20   Reubin Milan, MD  methylPREDNISolone (MEDROL DOSEPAK) 4 MG TBPK tablet Day 1: Take 6 tablets Day 2: Take 5 tablets Day 3: Take 4 tablets Day 4: Take 3 tablets Day 5: Take 2 tablets Day 6: Take 1 tablet 09/28/20   Jerrol Banana, MD  naproxen (NAPROSYN) 500 MG tablet Take 1 tablet (500 mg total) by mouth 2 (two) times daily with a meal. 12/02/18   Reubin Milan, MD  SUMAtriptan (IMITREX)  100 MG tablet Take 1 tablet (100 mg total) by mouth daily as needed. 07/28/19   Reubin Milan, MD  SUMAtriptan Succinate 4 MG/0.5ML SOAJ Inject 4 mg into the skin daily as needed. 07/28/19   Reubin Milan, MD  Vitamin D, Ergocalciferol, 50 MCG (2000 UT) CAPS Take 2,000 Units by mouth daily.    [provider]  zolpidem (AMBIEN) 10 MG tablet Take 1 tablet (10 mg total) by mouth at bedtime as needed for sleep. Patient taking differently: Take 5 mg by mouth at bedtime as needed for sleep. 09/04/20   Reubin Milan, MD    Allergies Amlodipine  Family History  Problem Relation Age of Onset   Diabetes Daughter    Hypertension Daughter    Breast cancer Maternal Aunt    Diabetes Maternal Uncle     Social History Social History   Tobacco Use   Smoking status: Never   Smokeless tobacco: Never  Vaping Use   Vaping Use: Never used  Substance Use Topics   Alcohol use: Not Currently    Alcohol/week: 2.0 standard drinks    Types: 2 Glasses of wine per week   Drug use: Never    Review of Systems  Review of Systems  Constitutional:  Negative for chills and fever.  HENT:  Negative for sore throat.   Eyes:  Negative for pain.  Respiratory:  Negative for cough and stridor.   Cardiovascular:  Negative for chest pain.  Gastrointestinal:  Negative for vomiting.  Genitourinary:  Negative for dysuria.  Musculoskeletal:  Positive for joint pain (R wrist) and myalgias (R wrist and forearm).  Skin:  Negative for rash.  Neurological:  Negative for seizures, loss of consciousness and headaches.  Psychiatric/Behavioral:  Negative for suicidal ideas.   All other systems reviewed and are negative.    ____________________________________________   PHYSICAL EXAM:  VITAL SIGNS: ED Triage Vitals [10/07/20 2124]  Enc Vitals Group     BP      Pulse      Resp      Temp      Temp src      SpO2      Weight      Height      Head Circumference      Peak Flow      Pain Score 10      Pain Loc      Pain Edu?      Excl. in GC?    Vitals:   10/07/20 2129  BP: (!) 161/74  Pulse: 67  Resp: 18  Temp: 98.7 F (37.1 C)  SpO2: 100%   Physical Exam Vitals and nursing note reviewed.  Constitutional:      General: She is not in acute distress.    Appearance: She is well-developed.  HENT:     Head: Normocephalic and atraumatic.  Right Ear: External ear normal.     Left Ear: External ear normal.     Nose: Nose normal.  Eyes:     Conjunctiva/sclera: Conjunctivae normal.  Cardiovascular:     Rate and Rhythm: Normal rate and regular rhythm.     Heart sounds: No murmur heard. Pulmonary:     Effort: Pulmonary effort is normal. No respiratory distress.     Breath sounds: Normal breath sounds.  Abdominal:     Palpations: Abdomen is soft.     Tenderness: There is no abdominal tenderness.  Musculoskeletal:     Cervical back: Neck supple.  Skin:    General: Skin is warm and dry.     Capillary Refill: Capillary refill takes less than 2 seconds.  Neurological:     Mental Status: She is alert and oriented to person, place, and time.  Psychiatric:        Mood and Affect: Mood normal.    There is deformity of the right wrist which patient is able to flex and extend.  She is able to move her fingers.  Sensation is intact in the distribution of the radial ulnar and median nerves in the right hand.  2+ radial pulse.  There is no tenderness effusion deformity at the right elbow or shoulder although she is unwilling to try to flex and extend them secondary to pain in the wrist.  She has full strength of the left upper extremity.  She is full function of the bilateral lower extremities.  There is abrasion over the left anterior knee but she has forage motion and strength there is no effusion or deformity.  2+ DP pulses.  There is a very small abrasion on the medial aspect of the left first toe.  No other injuries to the foot.  There is no tenderness step-offs deformities of the  C/T/L-spine.  Cranial nerves II through XII are grossly intact. ____________________________________________   LABS (all labs ordered are listed, but only abnormal results are displayed)  Labs Reviewed - No data to display ____________________________________________  EKG  ____________________________________________  RADIOLOGY  ED MD interpretation: Plan film of the right wrist shows comminuted appearing intra-articular fracture of the distal radius with some dorsal angulation of the distal fragments.  There is also a minimally displaced ulnar styloid fragment.  Official radiology report(s): DG Wrist Complete Right  Result Date: 10/07/2020 CLINICAL DATA:  Right wrist pain. EXAM: RIGHT WRIST - COMPLETE 3+ VIEW COMPARISON:  None. FINDINGS: Comminuted appearing fracture of distal radius with extension of the fracture into the articular surface. There is dorsal angulation of the distal fracture fragment. Minimally displaced fracture of the ulnar styloid. No dislocation. The bones are osteopenic. Soft tissue swelling of the wrist. No radiopaque foreign object or soft tissue gas. IMPRESSION: 1. Comminuted appearing intra-articular fracture of the distal radius with dorsal angulation of the distal fracture fragment. 2. Minimally displaced ulnar styloid fracture. Electronically Signed   By: Elgie Collard M.D.   On: 10/07/2020 21:59    ____________________________________________   PROCEDURES  Procedure(s) performed (including Critical Care):  Reduction of fracture  Date/Time: 10/07/2020 11:00 PM Performed by: Gilles Chiquito, MD Authorized by: Gilles Chiquito, MD  Consent: Verbal consent obtained. Consent given by: patient Patient understanding: patient states understanding of the procedure being performed Patient identity confirmed: verbally with patient and arm band Local anesthesia used: yes Anesthesia: nerve block  Anesthesia: Local anesthesia used: yes Local Anesthetic:  bupivacaine 0.25% without epinephrine Anesthetic total: 5 mL  Sedation: Patient sedated: no  Patient tolerance: patient tolerated the procedure well with no immediate complications     ____________________________________________   INITIAL IMPRESSION / ASSESSMENT AND PLAN / ED COURSE        Patient presents with above-stated history and exam for assessment of significant pain and deformity at the right wrist after FOOSH injury described above.  On arrival she is hypertensive with ostial vital signs.  She does have deformity and decreased strength and range of motion of the right wrist but is otherwise neurovascularly intact in the extremities.  She also has a small abrasion over the left knee and over the medial aspect left toe but no other significant injuries on exam.  Mechanical mechanism is clear described to have a low suspicion for syncope or seizure.  She was given some IV morphine on arrival and disposition performed hematoma block.  I attempted to perform reduction of the distal radial fragment unfortunately to comminuted to maintain in place as seen on postreduction film..  No other significant fractures or dislocations.  Do not believe she requires emergent surgery.  Compartments are soft and there is no features to suggest acute compartment syndrome at this time.  She was placed in a sugar-tong splint and instructed to follow-up with orthopedic service.   Discharged stable condition.  Strict return precautions advised and discussed.       ____________________________________________   FINAL CLINICAL IMPRESSION(S) / ED DIAGNOSES  Final diagnoses:  Closed fracture of right wrist, initial encounter    Medications  morphine 4 MG/ML injection 6 mg (6 mg Intravenous Given 10/07/20 2216)  bupivacaine (MARCAINE) 0.5 % injection 50 mL (30 mLs Infiltration Given 10/07/20 2249)  ondansetron (ZOFRAN) injection 4 mg (4 mg Intravenous Given 10/07/20 2209)     ED Discharge Orders           Ordered    oxyCODONE-acetaminophen (PERCOCET) 5-325 MG tablet  Every 6 hours PRN        10/07/20 2231             Note:  This document was prepared using Dragon voice recognition software and may include unintentional dictation errors.    Gilles Chiquito, MD 10/07/20 2308

## 2020-10-09 ENCOUNTER — Other Ambulatory Visit: Payer: Self-pay | Admitting: Orthopedic Surgery

## 2020-10-11 ENCOUNTER — Other Ambulatory Visit
Admission: RE | Admit: 2020-10-11 | Discharge: 2020-10-11 | Disposition: A | Discharge status: Home / Self Care | Payer: 59 | Source: Ambulatory Visit | Attending: Orthopedic Surgery | Admitting: Orthopedic Surgery

## 2020-10-11 ENCOUNTER — Other Ambulatory Visit: Payer: Self-pay

## 2020-10-11 ENCOUNTER — Encounter
Admission: RE | Admit: 2020-10-11 | Discharge: 2020-10-11 | Disposition: A | Discharge status: Home / Self Care | Payer: 59 | Source: Ambulatory Visit | Attending: Orthopedic Surgery | Admitting: Orthopedic Surgery

## 2020-10-11 DIAGNOSIS — Z01818 Encounter for other preprocedural examination: Secondary | ICD-10-CM | POA: Insufficient documentation

## 2020-10-11 HISTORY — DX: Nausea with vomiting, unspecified: R11.2

## 2020-10-11 HISTORY — DX: Unspecified osteoarthritis, unspecified site: M19.90

## 2020-10-11 HISTORY — DX: Nausea with vomiting, unspecified: Z98.890

## 2020-10-11 HISTORY — DX: Headache, unspecified: R51.9

## 2020-10-11 HISTORY — DX: Other complications of anesthesia, initial encounter: T88.59XA

## 2020-10-11 HISTORY — DX: Gastro-esophageal reflux disease without esophagitis: K21.9

## 2020-10-11 HISTORY — DX: Anxiety disorder, unspecified: F41.9

## 2020-10-11 HISTORY — DX: Anemia, unspecified: D64.9

## 2020-10-11 LAB — BASIC METABOLIC PANEL
Anion gap: 8 (ref 5–15)
BUN: 22 mg/dL (ref 8–23)
CO2: 28 mmol/L (ref 22–32)
Calcium: 8.9 mg/dL (ref 8.9–10.3)
Chloride: 100 mmol/L (ref 98–111)
Creatinine, Ser: 0.64 mg/dL (ref 0.44–1.00)
GFR, Estimated: >60 mL/min (ref >60)
Glucose, Bld: 106 mg/dL — ABNORMAL HIGH (ref 70–99)
Potassium: 3.6 mmol/L (ref 3.5–5.1)
Sodium: 136 mmol/L (ref 135–145)

## 2020-10-11 LAB — CBC
HCT: 38.2 % (ref 36.0–46.0)
Hemoglobin: 12.9 g/dL (ref 12.0–15.0)
MCH: 31 pg (ref 26.0–34.0)
MCHC: 33.8 g/dL (ref 30.0–36.0)
MCV: 91.8 fL (ref 80.0–100.0)
Platelets: 226 10*3/uL (ref 150–400)
RBC: 4.16 MIL/uL (ref 3.87–5.11)
RDW: 13 % (ref 11.5–15.5)
WBC: 6.2 10*3/uL (ref 4.0–10.5)
nRBC: 0.3 % — ABNORMAL HIGH (ref 0.0–0.2)

## 2020-10-11 NOTE — Patient Instructions (Addendum)
Your procedure is scheduled on: 10/12/20  Report to the Registration Desk on the 1st floor of the [facility]. To find out your arrival time, please call [phone] between 1PM - 3PM on: 10/11/20   REMEMBER: Instructions that are not followed completely may result in serious medical risk, up to and including death; or upon the discretion of your surgeon and anesthesiologist your surgery may need to be rescheduled.  Do not eat food after midnight the night before surgery.  No gum chewing, lozengers or hard candies.  You may however, drink CLEAR liquids up to 2 hours before you are scheduled to arrive for your surgery. Do not drink anything within 2 hours of your scheduled arrival time. Type 1 and Type 2 diabetics should only drink water.  TAKE THESE MEDICATIONS THE MORNING OF SURGERY WITH A SIP OF WATER: NONE  One week prior to surgery: Stop Anti-inflammatories (NSAIDS) such as Advil, Aleve, Ibuprofen, Motrin, Naproxen, Naprosyn and Aspirin based products such as Excedrin, Goodys Powder, BC Powder.  Stop ANY OVER THE COUNTER supplements until after surgery.  You may take Tylenol as directed if needed for pain up until the day of surgery.  No Alcohol for 24 hours before or after surgery.  No Smoking including e-cigarettes for 24 hours prior to surgery.  No chewable tobacco products for at least 6 hours prior to surgery.  No nicotine patches on the day of surgery.  Do not use any "recreational" drugs for at least a week prior to your surgery.  Please be advised that the combination of cocaine and anesthesia may have negative outcomes, up to and including death. If you test positive for cocaine, your surgery will be cancelled.  On the morning of surgery brush your teeth with toothpaste and water, you may rinse your mouth with mouthwash if you wish. Do not swallow any toothpaste or mouthwash.  Use CHG Soap or wipes as directed on instruction sheet.  Do not wear jewelry, make-up,  hairpins, clips or nail polish.  Do not wear lotions, powders, or perfumes.   Do not shave body from the neck down 48 hours prior to surgery just in case you cut yourself which could leave a site for infection.  Also, freshly shaved skin may become irritated if using the CHG soap.  Contact lenses, hearing aids and dentures may not be worn into surgery.  Do not bring valuables to the hospital. [facility] is not responsible for any missing/lost belongings or valuables.   Notify your doctor if there is any change in your medical condition (cold, fever, infection).  Wear comfortable clothing (specific to your surgery type) to the hospital.  After surgery, you can help prevent lung complications by doing breathing exercises.  Take deep breaths and cough every 1-2 hours. Your doctor may order a device called an Incentive Spirometer to help you take deep breaths. When coughing or sneezing, hold a pillow firmly against your incision with both hands. This is called "splinting." Doing this helps protect your incision. It also decreases belly discomfort.  If you are being admitted to the hospital overnight, leave your suitcase in the car. After surgery it may be brought to your room.  If you are being discharged the day of surgery, you will not be allowed to drive home. You will need a responsible adult (18 years or older) to drive you home and stay with you that night.   If you are taking public transportation, you will need to have a responsible  adult (18 years or older) with you. Please confirm with your physician that it is acceptable to use public transportation.   Please call the Pre-admissions Testing Dept. at [phone] if you have any questions about these instructions.  Surgery Visitation Policy:  Patients undergoing a surgery or procedure may have one family member or support person with them as long as that person is not COVID-19 positive or experiencing its symptoms.  That  person may remain in the waiting area during the procedure.  Inpatient Visitation:    Visiting hours are 7 a.m. to 8 p.m. Inpatients will be allowed two visitors daily. The visitors may change each day during the patient's stay. No visitors under the age of 46. Any visitor under the age of 59 must be accompanied by an adult. The visitor must pass COVID-19 screenings, use hand sanitizer when entering and exiting the patient's room and wear a mask at all times, including in the patient's room. Patients must also wear a mask when staff or their visitor are in the room. Masking is required regardless of vaccination status.

## 2020-10-12 ENCOUNTER — Ambulatory Visit
Admission: RE | Admit: 2020-10-12 | Discharge: 2020-10-12 | Disposition: A | Discharge status: Home / Self Care | Payer: 59 | Attending: Orthopedic Surgery | Admitting: Orthopedic Surgery

## 2020-10-12 ENCOUNTER — Ambulatory Visit: Payer: 59 | Admitting: Certified Registered Nurse Anesthetist

## 2020-10-12 ENCOUNTER — Encounter
Admission: RE | Disposition: A | Discharge status: Home / Self Care | Payer: Self-pay | Source: Home / Self Care | Attending: Orthopedic Surgery

## 2020-10-12 ENCOUNTER — Ambulatory Visit: Payer: 59

## 2020-10-12 ENCOUNTER — Encounter: Payer: Self-pay | Admitting: Orthopedic Surgery

## 2020-10-12 DIAGNOSIS — W010XXA Fall on same level from slipping, tripping and stumbling without subsequent striking against object, initial encounter: Secondary | ICD-10-CM | POA: Diagnosis not present

## 2020-10-12 DIAGNOSIS — E1140 Type 2 diabetes mellitus with diabetic neuropathy, unspecified: Secondary | ICD-10-CM | POA: Diagnosis not present

## 2020-10-12 DIAGNOSIS — Z820 Family history of epilepsy and other diseases of the nervous system: Secondary | ICD-10-CM | POA: Diagnosis not present

## 2020-10-12 DIAGNOSIS — Z9889 Other specified postprocedural states: Secondary | ICD-10-CM

## 2020-10-12 DIAGNOSIS — Y9389 Activity, other specified: Secondary | ICD-10-CM | POA: Diagnosis not present

## 2020-10-12 DIAGNOSIS — Z6830 Body mass index (BMI) 30.0-30.9, adult: Secondary | ICD-10-CM | POA: Diagnosis not present

## 2020-10-12 DIAGNOSIS — Z888 Allergy status to other drugs, medicaments and biological substances status: Secondary | ICD-10-CM | POA: Diagnosis not present

## 2020-10-12 DIAGNOSIS — Z833 Family history of diabetes mellitus: Secondary | ICD-10-CM | POA: Insufficient documentation

## 2020-10-12 DIAGNOSIS — E669 Obesity, unspecified: Secondary | ICD-10-CM | POA: Diagnosis not present

## 2020-10-12 DIAGNOSIS — Z79899 Other long term (current) drug therapy: Secondary | ICD-10-CM | POA: Diagnosis not present

## 2020-10-12 DIAGNOSIS — Z8249 Family history of ischemic heart disease and other diseases of the circulatory system: Secondary | ICD-10-CM | POA: Diagnosis not present

## 2020-10-12 DIAGNOSIS — M8580 Other specified disorders of bone density and structure, unspecified site: Secondary | ICD-10-CM | POA: Diagnosis not present

## 2020-10-12 DIAGNOSIS — Z419 Encounter for procedure for purposes other than remedying health state, unspecified: Secondary | ICD-10-CM

## 2020-10-12 DIAGNOSIS — S52501A Unspecified fracture of the lower end of right radius, initial encounter for closed fracture: Secondary | ICD-10-CM | POA: Insufficient documentation

## 2020-10-12 DIAGNOSIS — Z8781 Personal history of (healed) traumatic fracture: Secondary | ICD-10-CM

## 2020-10-12 HISTORY — PX: OPEN REDUCTION INTERNAL FIXATION (ORIF) DISTAL RADIAL FRACTURE: SHX5989

## 2020-10-12 LAB — GLUCOSE, CAPILLARY
Glucose-Capillary: 135 mg/dL — ABNORMAL HIGH (ref 70–99)
Glucose-Capillary: 139 mg/dL — ABNORMAL HIGH (ref 70–99)

## 2020-10-12 SURGERY — OPEN REDUCTION INTERNAL FIXATION (ORIF) DISTAL RADIUS FRACTURE
Anesthesia: General | Laterality: Right

## 2020-10-12 MED ORDER — METOCLOPRAMIDE HCL 5 MG/ML IJ SOLN: 5.0000 mg | Freq: Three times a day (TID) | INTRAMUSCULAR | Status: DC | PRN

## 2020-10-12 MED ORDER — DEXAMETHASONE SODIUM PHOSPHATE 10 MG/ML IJ SOLN
INTRAMUSCULAR | Status: DC | PRN
Start: 2020-10-12 — End: 2020-10-12
  Administered 2020-10-12: 8 mg via INTRAVENOUS

## 2020-10-12 MED ORDER — LIDOCAINE HCL (CARDIAC) PF 100 MG/5ML IV SOSY
PREFILLED_SYRINGE | INTRAVENOUS | Status: DC | PRN
  Administered 2020-10-12: 80 mg via INTRAVENOUS

## 2020-10-12 MED ORDER — STERILE WATER FOR INJECTION IJ SOLN
INTRAMUSCULAR | Status: AC
  Filled 2020-10-12: qty 10

## 2020-10-12 MED ORDER — FENTANYL CITRATE (PF) 100 MCG/2ML IJ SOLN
25.0000 ug | INTRAMUSCULAR | Status: DC | PRN
  Administered 2020-10-12 (×3): 25 ug via INTRAVENOUS

## 2020-10-12 MED ORDER — SODIUM CHLORIDE 0.9 % IV SOLN: INTRAVENOUS | Status: DC

## 2020-10-12 MED ORDER — FENTANYL CITRATE (PF) 100 MCG/2ML IJ SOLN
INTRAMUSCULAR | Status: DC | PRN
  Administered 2020-10-12: 50 ug via INTRAVENOUS

## 2020-10-12 MED ORDER — ACETAMINOPHEN 10 MG/ML IV SOLN: 1000.0000 mg | Freq: Once | INTRAVENOUS | Status: DC | PRN

## 2020-10-12 MED ORDER — OXYCODONE HCL 5 MG PO TABS
5.0000 mg | ORAL_TABLET | Freq: Once | ORAL | Status: DC | PRN
Start: 2020-10-12 — End: 2020-10-12

## 2020-10-12 MED ORDER — OXYCODONE HCL 5 MG/5ML PO SOLN
5.0000 mg | Freq: Once | ORAL | Status: DC | PRN
Start: 2020-10-12 — End: 2020-10-12

## 2020-10-12 MED ORDER — PROMETHAZINE HCL 25 MG/ML IJ SOLN
6.2500 mg | INTRAMUSCULAR | Status: DC | PRN
Start: 2020-10-12 — End: 2020-10-12

## 2020-10-12 MED ORDER — BUPIVACAINE HCL (PF) 0.5 % IJ SOLN
INTRAMUSCULAR | Status: DC | PRN
  Administered 2020-10-12: 10 mL

## 2020-10-12 MED ORDER — BUPIVACAINE HCL (PF) 0.5 % IJ SOLN
INTRAMUSCULAR | Status: AC
  Filled 2020-10-12: qty 10

## 2020-10-12 MED ORDER — CEFAZOLIN SODIUM-DEXTROSE 2-4 GM/100ML-% IV SOLN
INTRAVENOUS | Status: AC
  Filled 2020-10-12: qty 100

## 2020-10-12 MED ORDER — ONDANSETRON HCL 4 MG/2ML IJ SOLN: 4.0000 mg | Freq: Four times a day (QID) | INTRAMUSCULAR | Status: DC | PRN

## 2020-10-12 MED ORDER — NEOMYCIN-POLYMYXIN B GU 40-200000 IR SOLN
Status: AC
  Filled 2020-10-12: qty 2

## 2020-10-12 MED ORDER — ONDANSETRON HCL 4 MG PO TABS: 4.0000 mg | ORAL_TABLET | Freq: Four times a day (QID) | ORAL | Status: DC | PRN

## 2020-10-12 MED ORDER — ORAL CARE MOUTH RINSE: 15.0000 mL | Freq: Once | OROMUCOSAL | Status: AC

## 2020-10-12 MED ORDER — CEFAZOLIN SODIUM-DEXTROSE 2-4 GM/100ML-% IV SOLN
2.0000 g | INTRAVENOUS | Status: AC
  Administered 2020-10-12: 2 g via INTRAVENOUS

## 2020-10-12 MED ORDER — GLYCOPYRROLATE 0.2 MG/ML IJ SOLN
INTRAMUSCULAR | Status: DC | PRN
  Administered 2020-10-12: .2 mg via INTRAVENOUS

## 2020-10-12 MED ORDER — FENTANYL CITRATE (PF) 100 MCG/2ML IJ SOLN
INTRAMUSCULAR | Status: AC
  Administered 2020-10-12: 25 ug via INTRAVENOUS
  Filled 2020-10-12: qty 2

## 2020-10-12 MED ORDER — KETOROLAC TROMETHAMINE 30 MG/ML IJ SOLN
INTRAMUSCULAR | Status: DC | PRN
  Administered 2020-10-12: 15 mg via INTRAVENOUS

## 2020-10-12 MED ORDER — METOCLOPRAMIDE HCL 10 MG PO TABS: 5.0000 mg | ORAL_TABLET | Freq: Three times a day (TID) | ORAL | Status: DC | PRN

## 2020-10-12 MED ORDER — PHENYLEPHRINE HCL (PRESSORS) 10 MG/ML IV SOLN
INTRAVENOUS | Status: DC | PRN
  Administered 2020-10-12 (×2): 100 ug via INTRAVENOUS

## 2020-10-12 MED ORDER — ONDANSETRON HCL 4 MG/2ML IJ SOLN
INTRAMUSCULAR | Status: DC | PRN
  Administered 2020-10-12: 4 mg via INTRAVENOUS

## 2020-10-12 MED ORDER — ACETAMINOPHEN 10 MG/ML IV SOLN
INTRAVENOUS | Status: DC | PRN
  Administered 2020-10-12: 1000 mg via INTRAVENOUS

## 2020-10-12 MED ORDER — SODIUM CHLORIDE 0.9 % IR SOLN
Status: DC | PRN
  Administered 2020-10-12: 502 mL

## 2020-10-12 MED ORDER — ACETAMINOPHEN 10 MG/ML IV SOLN
INTRAVENOUS | Status: AC
  Filled 2020-10-12: qty 100

## 2020-10-12 MED ORDER — APREPITANT 40 MG PO CAPS: 40.0000 mg | ORAL_CAPSULE | Freq: Once | ORAL | Status: AC

## 2020-10-12 MED ORDER — LIDOCAINE HCL (PF) 1 % IJ SOLN
INTRAMUSCULAR | Status: DC | PRN
  Administered 2020-10-12: 1 mL via SUBCUTANEOUS

## 2020-10-12 MED ORDER — FAMOTIDINE 20 MG PO TABS
ORAL_TABLET | ORAL | Status: AC
  Administered 2020-10-12: 20 mg via ORAL
  Filled 2020-10-12: qty 1

## 2020-10-12 MED ORDER — APREPITANT 40 MG PO CAPS
ORAL_CAPSULE | ORAL | Status: AC
  Administered 2020-10-12: 40 mg via ORAL
  Filled 2020-10-12: qty 1

## 2020-10-12 MED ORDER — CHLORHEXIDINE GLUCONATE 0.12 % MT SOLN: 15.0000 mL | Freq: Once | OROMUCOSAL | Status: AC

## 2020-10-12 MED ORDER — PROPOFOL 10 MG/ML IV BOLUS
INTRAVENOUS | Status: DC | PRN
  Administered 2020-10-12: 160 mg via INTRAVENOUS

## 2020-10-12 MED ORDER — LIDOCAINE HCL (PF) 1 % IJ SOLN
INTRAMUSCULAR | Status: AC
  Filled 2020-10-12: qty 5

## 2020-10-12 MED ORDER — DROPERIDOL 2.5 MG/ML IJ SOLN
0.6250 mg | Freq: Once | INTRAMUSCULAR | Status: DC | PRN
  Filled 2020-10-12: qty 2

## 2020-10-12 MED ORDER — FAMOTIDINE 20 MG PO TABS: 20.0000 mg | ORAL_TABLET | Freq: Once | ORAL | Status: AC

## 2020-10-12 MED ORDER — FENTANYL CITRATE (PF) 100 MCG/2ML IJ SOLN
INTRAMUSCULAR | Status: AC
  Filled 2020-10-12: qty 2

## 2020-10-12 MED ORDER — PROPOFOL 10 MG/ML IV BOLUS
INTRAVENOUS | Status: AC
  Filled 2020-10-12: qty 40

## 2020-10-12 MED ORDER — CHLORHEXIDINE GLUCONATE 0.12 % MT SOLN
OROMUCOSAL | Status: AC
  Administered 2020-10-12: 15 mL via OROMUCOSAL
  Filled 2020-10-12: qty 15

## 2020-10-12 SURGICAL SUPPLY — 46 items
APL PRP STRL LF DISP 70% ISPRP (MISCELLANEOUS) ×1
BIT DRILL 2 FAST STEP (BIT) ×1 IMPLANT
BIT DRILL 2.5X4 QC (BIT) ×1 IMPLANT
BNDG ELASTIC 3X5.8 VLCR STR LF (GAUZE/BANDAGES/DRESSINGS) ×1 IMPLANT
BNDG ELASTIC 4X5.8 VLCR STR LF (GAUZE/BANDAGES/DRESSINGS) ×1 IMPLANT
CHLORAPREP W/TINT 26 (MISCELLANEOUS) ×2 IMPLANT
CUFF TOURN SGL QUICK 18X4 (TOURNIQUET CUFF) IMPLANT
DRAPE FLUOR MINI C-ARM 54X84 (DRAPES) ×2 IMPLANT
ELECT REM PT RETURN 9FT ADLT (ELECTROSURGICAL) ×2
ELECTRODE REM PT RTRN 9FT ADLT (ELECTROSURGICAL) ×1 IMPLANT
GAUZE 4X4 16PLY ~~LOC~~+RFID DBL (SPONGE) ×2 IMPLANT
GAUZE SPONGE 4X4 12PLY STRL (GAUZE/BANDAGES/DRESSINGS) ×2 IMPLANT
GAUZE XEROFORM 1X8 LF (GAUZE/BANDAGES/DRESSINGS) ×3 IMPLANT
GLOVE SURG SYN 9.0  PF PI (GLOVE) ×2
GLOVE SURG SYN 9.0 PF PI (GLOVE) ×1 IMPLANT
GOWN SRG 2XL LVL 4 RGLN SLV (GOWNS) ×1 IMPLANT
GOWN STRL NON-REIN 2XL LVL4 (GOWNS) ×2
GOWN STRL REUS W/ TWL LRG LVL3 (GOWN DISPOSABLE) ×1 IMPLANT
GOWN STRL REUS W/TWL LRG LVL3 (GOWN DISPOSABLE) ×2
K-WIRE 1.6 (WIRE) ×2
K-WIRE FX5X1.6XNS BN SS (WIRE) ×1
KIT TURNOVER KIT A (KITS) ×2 IMPLANT
KWIRE FX5X1.6XNS BN SS (WIRE) IMPLANT
MANIFOLD NEPTUNE II (INSTRUMENTS) ×2 IMPLANT
NDL FILTER BLUNT 18X1 1/2 (NEEDLE) ×1 IMPLANT
NEEDLE FILTER BLUNT 18X 1/2SAF (NEEDLE) ×1
NEEDLE FILTER BLUNT 18X1 1/2 (NEEDLE) ×1 IMPLANT
NS IRRIG 500ML POUR BTL (IV SOLUTION) ×2 IMPLANT
PACK EXTREMITY ARMC (MISCELLANEOUS) ×2 IMPLANT
PAD CAST CTTN 4X4 STRL (SOFTGOODS) ×2 IMPLANT
PADDING CAST COTTON 4X4 STRL (SOFTGOODS) ×4
PEG SUBCHONDRAL SMOOTH 2.0X14 (Peg) ×1 IMPLANT
PEG SUBCHONDRAL SMOOTH 2.0X18 (Peg) ×1 IMPLANT
PEG SUBCHONDRAL SMOOTH 2.0X20 (Peg) ×3 IMPLANT
PEG SUBCHONDRAL SMOOTH 2.0X22 (Peg) ×2 IMPLANT
PLATE SHORT 21.6X48.9 NRRW RT (Plate) ×1 IMPLANT
SCALPEL PROTECTED #15 DISP (BLADE) ×4 IMPLANT
SCREW CORT 3.5X10 LNG (Screw) ×3 IMPLANT
SLING ARM LRG DEEP (SOFTGOODS) ×1 IMPLANT
SPLINT CAST 1 STEP 3X12 (MISCELLANEOUS) ×2 IMPLANT
SUT ETHILON 4-0 (SUTURE) ×2
SUT ETHILON 4-0 FS2 18XMFL BLK (SUTURE) ×1
SUT VICRYL 3-0 27IN (SUTURE) ×2 IMPLANT
SUTURE ETHLN 4-0 FS2 18XMF BLK (SUTURE) ×1 IMPLANT
SYR 3ML LL SCALE MARK (SYRINGE) ×2 IMPLANT
WATER STERILE IRR 500ML POUR (IV SOLUTION) ×1 IMPLANT

## 2020-10-12 NOTE — Anesthesia Procedure Notes (Signed)
Procedure Name: LMA Insertion Date/Time: 10/12/2020 9:41 AM Performed by: Henrietta Hoover, CRNA Pre-anesthesia Checklist: Patient being monitored, Suction available, Emergency Drugs available and Patient identified Patient Re-evaluated:Patient Re-evaluated prior to induction Oxygen Delivery Method: Circle system utilized Preoxygenation: Pre-oxygenation with 100% oxygen Induction Type: IV induction LMA: LMA inserted LMA Size: 4.0 Number of attempts: 1 Placement Confirmation: positive ETCO2 and breath sounds checked- equal and bilateral Tube secured with: Tape Dental Injury: Teeth and Oropharynx as per pre-operative assessment

## 2020-10-12 NOTE — Op Note (Signed)
10/12/2020  10:36 AM  PATIENT:  [name]  65 y.o. female  PRE-OPERATIVE DIAGNOSIS:  Closed fracture of right wrist, inital encounter  POST-OPERATIVE DIAGNOSIS:  Closed fracture of right wrist, inital encounter  PROCEDURE:  Procedure(s): OPEN REDUCTION INTERNAL FIXATION (ORIF) DISTAL RADIAL FRACTURE (Right)  SURGEON: Leitha Schuller, MD  ASSISTANTS: None  ANESTHESIA:   general  EBL:  Total I/O In: 700 [I.V.:500; IV Piggyback:200] Out: -   BLOOD ADMINISTERED:none  DRAINS: none   LOCAL MEDICATIONS USED:  NONE  SPECIMEN:  No Specimen  DISPOSITION OF SPECIMEN:  N/A  COUNTS:  YES  TOURNIQUET:   Total Tourniquet Time Documented: Upper Arm (Right) - 26 minutes Total: Upper Arm (Right) - 26 minutes   IMPLANTS: [employer] innovations short narrow DVR plate with multiple smooth pegs and screws  DICTATION: .Dragon Dictation patient was brought to the operating room and after adequate anesthesia was obtained the right arm was prepped and draped in usual sterile fashion with a tourniquet applied the upper arm.  After patient identification and timeout procedures were completed volar approach was made centered over the FCR tendon.  The tendon sheath was incised and the FCR retracted radially protect the radial artery and associated veins.  Tourniquet been raised at the start of the case.  The distal radius was then exposed with significant soft tissue stripping of the distal fragment already having occurred.  With a distal first technique the DVR plate was applied pinned in position when is the appropriate position all the smooth pegs were placed drilling measuring and placing the smooth pegs followed by bringing the plate down to the shaft and getting anatomic alignment.  310 mm cortical screws were placed and under fluoroscopy the fracture was stable.  There were 3 distal fragments with anatomic alignment in both AP lateral and oblique views with no penetration of hardware in the  joint.  Tourniquet was let down and the wound thoroughly irrigated.  Wound was closed with 3-0 Vicryl subcutaneously and 4-0 nylon skin sutures.  Xeroform 4 x 4's web roll volar splint and Ace wrap applied.  PLAN OF CARE: Discharge to home after PACU  PATIENT DISPOSITION:  PACU - hemodynamically stable.

## 2020-10-12 NOTE — Anesthesia Postprocedure Evaluation (Signed)
Anesthesia Post Note  Patient: [name]  Procedure(s) Performed: OPEN REDUCTION INTERNAL FIXATION (ORIF) DISTAL RADIAL FRACTURE (Right)  Patient location during evaluation: PACU Anesthesia Type: General Level of consciousness: awake and alert Pain management: pain level controlled Vital Signs Assessment: post-procedure vital signs reviewed and stable Respiratory status: spontaneous breathing, nonlabored ventilation and respiratory function stable Cardiovascular status: blood pressure returned to baseline and stable Postop Assessment: no apparent nausea or vomiting Anesthetic complications: no   No notable events documented.   Last Vitals:  Vitals:   10/12/20 1200 10/12/20 1210  BP: 117/73 135/87  Pulse: 62 72  Resp: 15 20  Temp: (!) 36.3 C (!) 36.1 C  SpO2: 97% 95%    Last Pain:  Vitals:   10/12/20 1210  TempSrc: Temporal  PainSc: 3                  Foye Deer

## 2020-10-12 NOTE — Anesthesia Preprocedure Evaluation (Addendum)
Anesthesia Evaluation  Patient identified by MRN, date of birth, ID band  Reviewed: Allergy & Precautions, NPO status , Patient's Chart, lab work & pertinent test results  History of Anesthesia Complications (+) PONV and history of anesthetic complications  Airway Mallampati: III  TM Distance: >3 FB Neck ROM: Full    Dental no notable dental hx.    Pulmonary sleep apnea (noncompliant with CPAP) ,    Pulmonary exam normal        Cardiovascular Exercise Tolerance: Poor hypertension, Pt. on medications Normal cardiovascular exam     Neuro/Psych  Headaches, PSYCHIATRIC DISORDERS Anxiety Depression    GI/Hepatic negative GI ROS, Neg liver ROS,   Endo/Other  diabetes (Neuropathy), Type 2  Renal/GU negative Renal ROS     Musculoskeletal  (+) Arthritis , Closed fracture of right wrist   Abdominal (+) + obese,   Peds  Hematology negative hematology ROS (+)   Anesthesia Other Findings Arthralgia of right temporomandibular joint Obesity  Reproductive/Obstetrics                            Anesthesia Physical Anesthesia Plan  ASA: 3  Anesthesia Plan: General   Post-op Pain Management:  Regional for Post-op pain   Induction: Intravenous  PONV Risk Score and Plan: Propofol infusion  Airway Management Planned: LMA  Additional Equipment:   Intra-op Plan:   Post-operative Plan: Extubation in OR  Informed Consent: I have reviewed the patients History and Physical, chart, labs and discussed the procedure including the risks, benefits and alternatives for the proposed anesthesia with the patient or authorized representative who has indicated his/her understanding and acceptance.     Dental advisory given  Plan Discussed with: CRNA and Anesthesiologist  Anesthesia Plan Comments:       Anesthesia Quick Evaluation

## 2020-10-12 NOTE — Anesthesia Procedure Notes (Signed)
Anesthesia Regional Block: Supraclavicular block   Pre-Anesthetic Checklist: , timeout performed,  Correct Patient, Correct Site, Correct Laterality,  Correct Procedure, Correct Position, site marked,  Risks and benefits discussed,  Surgical consent,  Pre-op evaluation,  At surgeon's request and post-op pain management  Laterality: Upper and Right  Prep: chloraprep       Needles:  Injection technique: Single-shot  Needle Type: Stimiplex     Needle Length: 4cm  Needle Gauge: 20     Additional Needles:   Procedures:,,,, ultrasound used (permanent image in chart),,    Narrative:  Start time: 10/12/2020 11:40 AM End time: 10/12/2020 11:45 AM Injection made incrementally with aspirations every 20 mL.  Performed by: Personally  Anesthesiologist: Foye Deer, MD  Additional Notes: Patient consented for risk and benefits of nerve block including but not limited to nerve damage, failed block, bleeding and infection.  Patient voiced understanding.  Functioning IV was confirmed and monitors were applied.  Timeout done prior to procedure and prior to any sedation being given to the patient.  Patient confirmed procedure site prior to any sedation given to the patient.  A Stimuplex needle was used. Sterile prep,hand hygiene and sterile gloves were used.  Minimal sedation used for procedure.  No paresthesia endorsed by patient during the procedure.  Negative aspiration and negative test dose prior to incremental administration of local anesthetic. The patient tolerated the procedure well with no immediate complications.

## 2020-10-12 NOTE — Transfer of Care (Signed)
Immediate Anesthesia Transfer of Care Note  Patient: AASTHA DAYLEY  Procedure(s) Performed: OPEN REDUCTION INTERNAL FIXATION (ORIF) DISTAL RADIAL FRACTURE (Right)  Patient Location: PACU  Anesthesia Type:General  Level of Consciousness: sedated, drowsy and patient cooperative  Airway & Oxygen Therapy: Patient Spontanous Breathing and Patient connected to face mask oxygen  Post-op Assessment: Report given to RN and Post -op Vital signs reviewed and stable  Post vital signs: Reviewed and stable  Last Vitals:  Vitals Value Taken Time  BP 109/70 10/12/20 1038  Temp    Pulse 63 10/12/20 1041  Resp 15 10/12/20 1041  SpO2 100 % 10/12/20 1041  Vitals shown include unvalidated device data.  Last Pain:  Vitals:   10/12/20 0830  TempSrc: Oral  PainSc: 1          Complications: No notable events documented.

## 2020-10-12 NOTE — Discharge Instructions (Addendum)
Work on finger motion is much as you can. Keep dressing clean and dry but if the splint feels too tight you can loosen the Ace wrap. Pain medicine as previously directed, call office this if you think you will run out before the weekend is over. Keep the arm elevated and ice to the back of the wrist today and tomorrow.   AMBULATORY SURGERY  DISCHARGE INSTRUCTIONS   The drugs that you were given will stay in your system until tomorrow so for the next 24 hours you should not:  Drive an automobile Make any legal decisions Drink any alcoholic beverage   You may resume regular meals tomorrow.  Today it is better to start with liquids and gradually work up to solid foods.  You may eat anything you prefer, but it is better to start with liquids, then soup and crackers, and gradually work up to solid foods.   Please notify your doctor immediately if you have any unusual bleeding, trouble breathing, redness and pain at the surgery site, drainage, fever, or pain not relieved by medication.    Your post-operative visit with Dr.                                       is: Date:                        Time:    Please call to schedule your post-operative visit.  Additional Instructions:

## 2020-10-12 NOTE — H&P (Signed)
Chief Complaint  Patient presents with   Right Wrist - Pain   Reason for Visit The patient is a 65 year old female that presented for complaints of her right wrist. The patient slipped and fell in the rain landing backwards on an outstretched wrist. She went to the emergency room where x-rays showed a displaced wrist fracture. They attempted reduction with a hematoma block, but were unable to change the angle. She was placed into a sugar-tong splint and sent here for evaluation. She is still having moderate pain. She was given Percocet for breakthrough pain. Patient is here to discuss possible surgery. The patient is not a diabetic.  Medications Current Outpatient Medications  Medication Sig Dispense Refill   BYSTOLIC 5 mg tablet   cholecalciferol (VITAMIN D3) 2,000 unit capsule Take 2,000 Units by mouth once daily   diazePAM (VALIUM) 5 MG tablet   DULoxetine (CYMBALTA) 60 MG DR capsule Take by mouth.   FERROUS SULFATE ORAL Take 1 tablet by mouth once daily   losartan-hydrochlorothiazide (HYZAAR) 100-12.5 mg tablet Take 1 tablet by mouth daily   naproxen (NAPROSYN) 500 MG tablet Take by mouth   oxyCODONE-acetaminophen (PERCOCET) 5-325 mg tablet   SUMAtriptan (IMITREX STATDOSE) 6 mg/0.5 mL pen injector kit   SUMAtriptan (IMITREX) 100 MG tablet Take by mouth.   zolpidem (AMBIEN) 10 mg tablet Take by mouth 1/2 tablet at night   No current facility-administered medications for this visit.   Allergies Amlodipine  Histories Past Medical History: Past Medical History:  Diagnosis Date   Arthralgia of right temporomandibular joint 03/04/2012   Articular disc disorder of temporomandibular joint 09/03/2019   Depression   Disturbance of skin sensation 09/17/2013  Last Assessment & Plan: Symptoms most consistent with peripheral neuropathy. Will check B12 with labs. We discussed possible screen for heavy metals, however she only drinks bottled water at home. Follow up in 4-8 weeks.   Essential  hypertension 11/08/2010  Last Assessment & Plan: BP Readings from Last 3 Encounters: 08/04/13 118/80 07/01/12 120/70 03/04/12 120/88 BP well controlled. We discussed importance of close monitoring while on phentermine. She will check BP daily and email with readings.   Hemorrhoids 03/04/2012  Last Assessment & Plan: Patient with long history of hemorrhoids. Not currently painful or bleeding. She would like a surgical consult for evaluation for removal. Will set up surgical eval.   Hypertension   Insomnia 03/04/2012  Last Assessment & Plan: Continue Ambien   Lateral epicondylitis of right elbow 06/13/2015   Major depressive disorder, single episode 12/12/2010  Last Assessment & Plan: Offered support today. Horrible situation with husband dying from [place] at home. Will continue Cymbalta, increase to 31m daily. Follow up in 4-8 weeks.   Migraine   Mixed hyperlipidemia 07/15/2017  Formatting of this note might be different from the original. The 10-year ASCVD risk score (Mikey BussingDC JBrooke Bonito, et al., 2013) is: 9.6% Values used to calculate the score: Age: 89years Sex: Female Is Non-Hispanic African American: No Diabetic: Yes Tobacco smoker: No Systolic Blood Pressure: 1440mmHg Is BP treated: Yes HDL Cholesterol: 37 mg/dL Total Cholesterol: 198   Moderate episode of recurrent major depressive disorder (CMS-HCC) 02/09/2020   Myofascial muscle pain 09/03/2019   Neuropathy 07/30/2019  Formatting of this note might be different from the original. Tried and failed Gabapentin and Cymbalta.   Obesity 08/04/2013  Last Assessment & Plan: Wt Readings from Last 3 Encounters: 09/17/13 186 lb 4 oz (84.482 kg) 08/04/13 192 lb 8 oz (87.317 kg) 07/01/12 195 lb (  88.451 kg) Body mass index is 31.49 kg/(m^2). Congratulated pt on weight loss. Will continue phentermine for appetite suppression. Follow up in 4-8 weeks.   Osteopenia determined by x-ray 04/26/2019  Formatting of this note might be different from the original. DEXA 03/3019 - rec  Calcium and vitamin D and exercise   Rash of entire body 09/28/2020  Last Assessment & Plan: Formatting of this note might be different from the original. Patient presents with progressive full body rash noted over the past 2 days, denies any specific exposure history though does have cats and dogs with some animal sleeping in the same bed. Additionally, she reports recent change in her Bystolic from brand name to generic nebivolol, uncertain if related. She den   Routine general medical examination at a health care facility 08/04/2013  Last Assessment & Plan: General medical exam including breast exam normal today. PAP and pelvic deferred as PAP normal, HPV neg in 2013. Pt is s/p hysterectomy. Mammogram ordered. Colonoscopy UTD and reviewed. Labs today including CBC, CMP, lipids, TSH, A1c. Immunizations are UTD except for Zostavax. Prefers to hold off on this for now.   S/P hysterectomy 02/09/2020  Formatting of this note might be different from the original. 2008 for menorrhagia   Sleep apnea 02/28/2015  Formatting of this note might be different from the original. Unable to tolerate appliances Last Assessment & Plan: Formatting of this note might be different from the original. Not tolerating CPAP well. Has follow up with sleep specialist. Encouraged her to keep this follow up as perhaps a new mask might be helpful.   Special screening for malignant neoplasms, colon 03/04/2012  Last Assessment & Plan: Will set up screening colonoscopy.   Trigger middle finger of left hand 06/13/2015   Trigger middle finger of right hand 06/13/2015   Type II diabetes mellitus with complication, uncontrolled 04/18/2017  Formatting of this note is different from the original. Lab Results Component Value Date HGBA1C 5.8 (H) 02/12/2017   Vitamin D deficiency 04/18/2017  Formatting of this note might be different from the original. Level 17 01/2017 -- rec 2000 IU daily   Past Surgical History: Past Surgical History:  Procedure  Laterality Date   HYSTERECTOMY   REDUCTION MAMMAPLASTY Bilateral 1994   TUBAL LIGATION   Social History: Social History   Socioeconomic History   Marital status: Widowed   Number of children: 1  Occupational History   Occupation: retired  Tobacco Use   Smoking status: Never Smoker   Smokeless tobacco: Never Used  Scientific laboratory technician Use: Never used  Substance and Sexual Activity   Alcohol use: No   Drug use: No   Sexual activity: Not Currently   Family History: Family History  Problem Relation Age of Onset   Migraines Mother   Heart disease Father   Diabetes type II Daughter   Migraines Daughter   Diabetes type II Maternal Aunt   Diabetes type II Maternal Uncle   Heart disease Maternal Grandmother   Review of Systems A comprehensive 14 point ROS was performed, reviewed, and the pertinent orthopaedic findings are documented in the HPI.  Exam Pulse 68  Ht 163.8 cm (5' 4.5")  Wt 82.8 kg (182 lb 9.6 oz)  BMI 30.86 kg/m   General/Constitutional: NAD, conversant Eyes: Pupils equal and round, extraocular movements intact ENT: atraumatic external nose and ears, moist mucous membranes Respiratory: non-labored breathing, symmetric chest rise, chest sounds clear. Cardiovascular: no visible lower extremity edema, peripheral pulses present, regular rate  and rhythm  Skin: normal skin turgor, warm and dry Neurological: cranial nerves grossly intact, sensation grossly intact Psychological: Appropriate mood and affect; appropriate judgment Musculoskeletal: as detailed below:  General: Well developed, well nourished 65 y.o. female in no apparent distress. Normal affect. Normal communication. Patient answers questions appropriately. The patient has a normal gait. There is no antalgic component. There is no hip lurch.   Heart: Examination of the heart reveals regular, rate, and rhythm. There is no murmur noted on ascultation. There is a normal apical pulse.  Lungs: Lungs are  clear to auscultation. There is no wheeze, rhonchi, or crackles. There is normal expansion of bilateral chest walls.   Right upper extremity: Examination of right wrist and arm reveals the sugar-tong splint in good position. There is mild edema into the fingers. She had no range of motion attempted with the wrist. She has very restricted grip strength on the right. She has neurovascular that essentially is intact.  Xrays: Xrays were reviewed of the right wrist that were taken at [facility] emergency room. The xrays showed a right distal radial impacted dorsally angulated fracture with comminution.  Impression Encounter Diagnoses  Name Primary?   Closed traumatic displaced fracture of distal end of right radius, initial encounter Yes   Type 2 diabetes mellitus without complication, with no history of insulin use (CMS-HCC)   Obesity (BMI 30.0-34.9), unspecified   Plan  1. I discussed the physical exam finding as well as x-ray results and previous treatment. I discussed the nature of her wrist fracture with the patient. I discussed the situation with Dr. Rudene Christians and he reviewed the x-rays. 2. After reviewing the situation from her wrist fracture. It was decided the patient would undergo a right distal radial open reduction internal fixation with volar plating to be done by Dr. Rudene Christians on October 12, 2020. I reviewed the risks, benefits, and alternatives to the surgery. The patient seems understand and wants to set up for that day. 3. The patient will continue in her sugar-tong splint at this time. 4. The patient will continue her Percocet if needed. 5. The patient will follow up after surgery for postoperative care.  This note will serve as the history and physical for surgery scheduled on October 12, 2020 for a right distal radial fracture volar plating with ORIF with Dr. Rudene Christians.   This note was generated in part with voice recognition software and I apologize for any typographical errors  that were not detected and corrected.  Parks Ranger, M.S.    Reviewed  H+P. No changes noted.

## 2020-10-14 ENCOUNTER — Encounter: Payer: Self-pay | Admitting: Family Medicine

## 2020-10-25 ENCOUNTER — Ambulatory Visit (INDEPENDENT_AMBULATORY_CARE_PROVIDER_SITE_OTHER): Payer: 59

## 2020-10-25 DIAGNOSIS — Z23 Encounter for immunization: Secondary | ICD-10-CM

## 2020-11-06 ENCOUNTER — Other Ambulatory Visit: Payer: Self-pay | Admitting: Internal Medicine

## 2020-11-06 DIAGNOSIS — I10 Essential (primary) hypertension: Secondary | ICD-10-CM

## 2020-11-06 DIAGNOSIS — F331 Major depressive disorder, recurrent, moderate: Secondary | ICD-10-CM

## 2020-11-06 DIAGNOSIS — F5101 Primary insomnia: Secondary | ICD-10-CM

## 2020-11-06 NOTE — Telephone Encounter (Signed)
Please Review

## 2020-11-09 ENCOUNTER — Encounter: Payer: Self-pay | Admitting: Occupational Therapy

## 2020-11-09 ENCOUNTER — Ambulatory Visit: Payer: 59 | Attending: Orthopedic Surgery | Admitting: Occupational Therapy

## 2020-11-09 DIAGNOSIS — L905 Scar conditions and fibrosis of skin: Secondary | ICD-10-CM | POA: Insufficient documentation

## 2020-11-09 DIAGNOSIS — M25631 Stiffness of right wrist, not elsewhere classified: Secondary | ICD-10-CM | POA: Diagnosis not present

## 2020-11-09 DIAGNOSIS — M6281 Muscle weakness (generalized): Secondary | ICD-10-CM | POA: Insufficient documentation

## 2020-11-09 DIAGNOSIS — M25641 Stiffness of right hand, not elsewhere classified: Secondary | ICD-10-CM | POA: Diagnosis present

## 2020-11-09 NOTE — Therapy (Signed)
[facility] [address] [place], [address] Phone: [phone]   Fax:  [phone]  Occupational Therapy Evaluation  Patient Details  Name: [name] MRN: [id number] Date of Birth: 27-Apr-1955 Referring Provider (OT): Floyce Stakes   Encounter Date: 11/09/2020   OT End of Session - 11/09/20 1650     Visit Number 1    Number of Visits 8    Date for OT Re-Evaluation 01/04/21    OT Start Time 1530    OT Stop Time 1628    OT Time Calculation (min) 58 min    Activity Tolerance Patient tolerated treatment well    Behavior During Therapy [facility] for tasks assessed/performed             Past Medical History:  Diagnosis Date   Anemia    Anxiety    Arthritis    Complication of anesthesia    Depression    Diabetes mellitus without complication (HCC)    GERD (gastroesophageal reflux disease)    Headache    Hypertension    PONV (postoperative nausea and vomiting)     Past Surgical History:  Procedure Laterality Date   ABDOMINAL HYSTERECTOMY  2008   for Menorrhagia   BREAST REDUCTION SURGERY     COLONOSCOPY     LAPAROSCOPY     OPEN REDUCTION INTERNAL FIXATION (ORIF) DISTAL RADIAL FRACTURE Right 10/12/2020   Procedure: OPEN REDUCTION INTERNAL FIXATION (ORIF) DISTAL RADIAL FRACTURE;  Surgeon: Kennedy Bucker, MD;  Location: [facility] ORS;  Service: Orthopedics;  Laterality: Right;   REDUCTION MAMMAPLASTY Bilateral 1991    There were no vitals filed for this visit.   Subjective Assessment - 11/09/20 1643     Subjective  Had a lot of swelling but coming down -had some wrinkles now -but the middle and ring finger still giving me issues, cannot striaghten middle and make fist -and wrist stiff- some pins and needles in palm but no numbmess    Pertinent History DANIELLY ACKERLEY is a 65 y.o. female  post right distal radius ORIF by Dr. Kennedy Bucker on 10/12/2020. Patient is doing well. Pain is improving  significantly. Swelling is improving. She is able to move her fingers. Wrist feels slightly tight and swollen but range of motion is improving. No warmth erythema or drainage. Sutures removed 10/25/20 and refer to OT    Patient Stated Goals Want to be able to grip and hold , carry , lift objects - taking care of my dog, house works, yard work               [place] OT Assessment - 11/09/20 0001       Assessment   Medical Diagnosis R distal radius fx with ORIF    Referring Provider (OT) Floyce Stakes    Onset Date/Surgical Date 10/12/20    Hand Dominance Left      Precautions   Required Braces or Orthoses --   wrist splint     Home  Environment   Lives With Alone      Prior Function   Vocation Retired    Leisure yard work, Nurse, mental health, house work . read      AROM   Right Wrist Extension 60 Degrees    Right Wrist Flexion 70 Degrees    Right Wrist Radial Deviation 18 Degrees    Right Wrist Ulnar Deviation 30 Degrees    Left Wrist Extension 70 Degrees    Left Wrist Flexion 90 Degrees  Left Wrist Radial Deviation 30 Degrees    Left Wrist Ulnar Deviation 32 Degrees      Right Hand AROM   R Thumb Opposition to Index --   WNL to [facility]   R Index  MCP 0-90 70 Degrees    R Index PIP 0-100 90 Degrees    R Long  MCP 0-90 80 Degrees    R Long PIP 0-100 90 Degrees    R Ring  MCP 0-90 75 Degrees    R Ring PIP 0-100 85 Degrees    R Little  MCP 0-90 80 Degrees    R Little PIP 0-100 85 Degrees                      OT Treatments/Exercises (OP) - 11/09/20 0001       RUE Contrast Bath   Time 8 minutes    Comments prior to review of HEP                    OT Education - 11/09/20 1650     Education Details findings of eval and HEP    Person(s) Educated Patient    Methods Explanation;Demonstration;Tactile cues;Verbal cues;Handout    Comprehension Verbal cues required;Returned demonstration;Verbalized understanding                 OT Long Term Goals - 11/09/20  1654       OT LONG TERM GOAL #1   Title Edema in R hand and wrist decrease for pt to do composite flexion touching palm to hold knife, brush and squeeze washcloth    Baseline edema in hand MC's 70-80 , PIP 's 85-90's - and decrease 3rd PIP extention -and pain and stiffness in 3rd and 4th - pain A1pulley of 3rd    Time 3    Period Weeks    Status New    Target Date 11/30/20      OT LONG TERM GOAL #2   Title Scar tissue adhesion improve for pt to show increase AROM for R wrist to [facility] to start PROM and resistance    Baseline scar adhere - impaired wrist ext 60, flexion 70 , RD 18 , UD 30    Time 4    Period Weeks    Status New    Target Date 12/07/20      OT LONG TERM GOAL #3   Title R grip and prehension strength increase to more than 60% compare to L to feed and take care of dog, push and pull door    Baseline NT 4 wks s/p    Time 8    Period Weeks    Status New    Target Date 01/04/21      OT LONG TERM GOAL #4   Title R wrist strength in all planes to be 5/5 to carry groceries, pull, push door , push up from bed    Baseline NT for strength AROM and AAROM being address    Time 8    Period Weeks    Status New    Target Date 01/04/21                   Plan - 11/09/20 1651     Clinical Impression Statement Pt present with R distal radius fx with ORIF done on 10/15/20 - pt this date 4 wks s/p - show cont increase edema in R hand and wrist - limiting her flexion of digits ,  extention of 3rd and 4th - with tenderness over 3rd A1pulley - pt show decrease wrist AROM in all planes- with some pins and needles in palm -but no numbness -pt ed on AAROM and AROM for digits and wrist - isotoner glove use and contrast - scar adhere at volar wrist - pt ed on scar massage and cica scar pad for night time - pt limited in use of R non dominant hand and wrist in ADLs and IADL's    OT Occupational Profile and History Problem Focused Assessment - Including review of records relating to  presenting problem    Occupational performance deficits (Please refer to evaluation for details): ADL's;IADL's;Play;Social Participation;Leisure;Rest and Sleep    Body Structure / Function / Physical Skills ADL;Strength;Edema;UE functional use;ROM;IADL;Scar mobility;Sensation;Flexibility;Decreased knowledge of precautions    Rehab Potential Good    Clinical Decision Making Limited treatment options, no task modification necessary    Comorbidities Affecting Occupational Performance: None    Modification or Assistance to Complete Evaluation  No modification of tasks or assist necessary to complete eval    OT Frequency 1x / week    OT Duration 8 weeks    OT Treatment/Interventions Self-care/ADL training;Fluidtherapy;Therapeutic activities;Contrast Bath;Therapeutic exercise;Scar mobilization;Passive range of motion;Paraffin;Manual Therapy;Patient/family education    Consulted and Agree with Plan of Care Patient             Patient will benefit from skilled therapeutic intervention in order to improve the following deficits and impairments:   Body Structure / Function / Physical Skills: ADL, Strength, Edema, UE functional use, ROM, IADL, Scar mobility, Sensation, Flexibility, Decreased knowledge of precautions       Visit Diagnosis: Stiffness of right wrist, not elsewhere classified - Plan: Ot plan of care cert/re-cert  Stiffness of right hand, not elsewhere classified - Plan: Ot plan of care cert/re-cert  Scar condition and fibrosis of skin - Plan: Ot plan of care cert/re-cert  Muscle weakness (generalized) - Plan: Ot plan of care cert/re-cert    Problem List Patient Active Problem List   Diagnosis Date Noted   Rash of entire body 09/28/2020   S/P hysterectomy 02/09/2020   Moderate episode of recurrent major depressive disorder (HCC) 02/09/2020   Neuropathy 07/30/2019   Osteopenia determined by x-ray 04/26/2019   Mixed hyperlipidemia 07/15/2017   Type II diabetes mellitus with  complication, uncontrolled 04/18/2017   Vitamin D deficiency 04/18/2017   Sleep apnea 02/28/2015   Obesity 02/28/2015   Routine general medical examination at a health care facility 08/04/2013   Hemorrhoids 03/04/2012   Insomnia 03/04/2012   Arthralgia 03/04/2012   Migraine without aura or status migrainosus 03/04/2012   Arthralgia of right temporomandibular joint 03/04/2012   Essential hypertension 11/08/2010    Oletta Cohn, OTR/L,CLT 11/09/2020, 5:08 PM  [facility] [address], [place], [address] Phone: [phone]   Fax:  [phone]  Name: DETTA MELLIN MRN: [id number] Date of Birth: 05/20/1955

## 2020-11-21 ENCOUNTER — Ambulatory Visit: Payer: 59 | Admitting: Occupational Therapy

## 2021-01-31 ENCOUNTER — Other Ambulatory Visit: Payer: Self-pay | Admitting: Internal Medicine

## 2021-01-31 DIAGNOSIS — I10 Essential (primary) hypertension: Secondary | ICD-10-CM

## 2021-01-31 DIAGNOSIS — F5101 Primary insomnia: Secondary | ICD-10-CM

## 2021-01-31 NOTE — Telephone Encounter (Signed)
Requested Prescriptions  Pending Prescriptions Disp Refills   losartan-hydrochlorothiazide (HYZAAR) 100-12.5 MG tablet [Pharmacy Med Name: LOSARTAN-HCTZ 100-12.5 MG TAB] 90 tablet 0    Sig: Take 1 tablet by mouth daily.     Cardiovascular: ARB + Diuretic Combos Passed - 01/31/2021  9:16 AM      Passed - K in normal range and within 180 days    Potassium  Date Value Ref Range Status  10/11/2020 3.6 3.5 - 5.1 mmol/L Final         Passed - Na in normal range and within 180 days    Sodium  Date Value Ref Range Status  10/11/2020 136 135 - 145 mmol/L Final  05/05/2020 139 134 - 144 mmol/L Final         Passed - Cr in normal range and within 180 days    Creatinine, Ser  Date Value Ref Range Status  10/11/2020 0.64 0.44 - 1.00 mg/dL Final         Passed - Ca in normal range and within 180 days    Calcium  Date Value Ref Range Status  10/11/2020 8.9 8.9 - 10.3 mg/dL Final         Passed - Patient is not pregnant      Passed - Last BP in normal range    BP Readings from Last 1 Encounters:  10/12/20 135/87         Passed - Valid encounter within last 6 months    Recent Outpatient Visits          4 months ago Rash of entire body   [facility] Montel Culver, MD   4 months ago Type II diabetes mellitus with complication, uncontrolled [facility])   [facility] Glean Hess, MD   9 months ago Type II diabetes mellitus with complication, uncontrolled [facility])   [facility] Glean Hess, MD   11 months ago Annual physical exam   [facility] Glean Hess, MD   1 year ago Essential hypertension   [facility] Glean Hess, MD      Future Appointments            In 1 week Army Melia Jesse Sans, MD [facility], [facility]

## 2021-01-31 NOTE — Telephone Encounter (Signed)
Copied from CRM [id number]. Topic: Quick Communication - Rx Refill/Question >> Jan 31, 2021  3:12 PM Jaquita Rector A wrote: Medication: zolpidem (AMBIEN) 10 MG tablet Patient states that insurance will only give her 15 tabs and she want to get all 30 like before please advise  Has the patient contacted their pharmacy? Yes.   (Agent: If no, request that the patient contact the pharmacy for the refill. If patient does not wish to contact the pharmacy document the reason why and proceed with request.) (Agent: If yes, when and what did the pharmacy advise?)  Preferred Pharmacy (with phone number or street name): [facility] - [place], [place] - [address]  Phone:  [phone] Fax:  [phone]    Has the patient been seen for an appointment in the last year OR does the patient have an upcoming appointment? Yes.    Agent: Please be advised that RX refills may take up to 3 business days. We ask that you follow-up with your pharmacy.

## 2021-02-01 MED ORDER — ZOLPIDEM TARTRATE 10 MG PO TABS: 10.0000 mg | ORAL_TABLET | Freq: Every evening | ORAL | 2 refills | Status: DC | PRN

## 2021-02-01 NOTE — Telephone Encounter (Signed)
Please review. Last office visit 09/28/2020.  KP

## 2021-02-01 NOTE — Telephone Encounter (Signed)
Requested medications are due for refill today.  yes  Requested medications are on the active medications list.  yes  Last refill. 11/06/2020  Future visit scheduled.   yes  Notes to clinic.  Medication not delegated.    Requested Prescriptions  Pending Prescriptions Disp Refills   zolpidem (AMBIEN) 10 MG tablet 30 tablet 2    Sig: Take 1 tablet (10 mg total) by mouth at bedtime as needed for sleep.     Not Delegated - Psychiatry:  Anxiolytics/Hypnotics Failed - 01/31/2021  4:25 PM      Failed - This refill cannot be delegated      Failed - Urine Drug Screen completed in last 360 days      Passed - Valid encounter within last 6 months    Recent Outpatient Visits           4 months ago Rash of entire body   [facility] Montel Culver, MD   4 months ago Type II diabetes mellitus with complication, uncontrolled [facility])   [facility] Glean Hess, MD   9 months ago Type II diabetes mellitus with complication, uncontrolled [facility])   [facility] Glean Hess, MD   11 months ago Annual physical exam   [facility] Glean Hess, MD   1 year ago Essential hypertension   [facility] Glean Hess, MD       Future Appointments             In 1 week Army Melia Jesse Sans, MD [facility], [facility]

## 2021-02-12 ENCOUNTER — Encounter: Payer: 59 | Admitting: Internal Medicine

## 2021-03-06 ENCOUNTER — Other Ambulatory Visit: Payer: Self-pay | Admitting: Internal Medicine

## 2021-03-07 ENCOUNTER — Other Ambulatory Visit: Payer: Self-pay

## 2021-03-07 ENCOUNTER — Ambulatory Visit (INDEPENDENT_AMBULATORY_CARE_PROVIDER_SITE_OTHER): Payer: 59 | Admitting: Internal Medicine

## 2021-03-07 ENCOUNTER — Telehealth: Payer: Self-pay

## 2021-03-07 ENCOUNTER — Encounter: Payer: Self-pay | Admitting: Internal Medicine

## 2021-03-07 VITALS — BP 128/72 | HR 74 | Ht 64.5 in | Wt 195.2 lb

## 2021-03-07 DIAGNOSIS — I10 Essential (primary) hypertension: Secondary | ICD-10-CM | POA: Diagnosis not present

## 2021-03-07 DIAGNOSIS — R7303 Prediabetes: Secondary | ICD-10-CM | POA: Diagnosis not present

## 2021-03-07 DIAGNOSIS — Z1231 Encounter for screening mammogram for malignant neoplasm of breast: Secondary | ICD-10-CM

## 2021-03-07 DIAGNOSIS — E782 Mixed hyperlipidemia: Secondary | ICD-10-CM | POA: Diagnosis not present

## 2021-03-07 DIAGNOSIS — Z0000 Encounter for general adult medical examination without abnormal findings: Secondary | ICD-10-CM

## 2021-03-07 DIAGNOSIS — F331 Major depressive disorder, recurrent, moderate: Secondary | ICD-10-CM | POA: Diagnosis not present

## 2021-03-07 DIAGNOSIS — K219 Gastro-esophageal reflux disease without esophagitis: Secondary | ICD-10-CM | POA: Diagnosis not present

## 2021-03-07 DIAGNOSIS — R69 Illness, unspecified: Secondary | ICD-10-CM | POA: Diagnosis not present

## 2021-03-07 DIAGNOSIS — N3000 Acute cystitis without hematuria: Secondary | ICD-10-CM

## 2021-03-07 LAB — POCT URINALYSIS DIPSTICK
Bilirubin, UA: NEGATIVE
Blood, UA: NEGATIVE
Glucose, UA: NEGATIVE
Ketones, UA: NEGATIVE
Nitrite, UA: NEGATIVE
Protein, UA: NEGATIVE
Spec Grav, UA: 1.015 (ref 1.010–1.025)
Urobilinogen, UA: 0.2 E.U./dL
pH, UA: 6 (ref 5.0–8.0)

## 2021-03-07 LAB — POCT UA - MICROSCOPIC ONLY
Casts, Ur, LPF, POC: 0
Epithelial cells, urine per micros: 5
Mucus, UA: 0
RBC, Urine, Miroscopic: 0 (ref 0–2)
WBC, Ur, HPF, POC: 5 (ref 0–5)
Yeast, UA: NEGATIVE

## 2021-03-07 MED ORDER — NITROFURANTOIN MONOHYD MACRO 100 MG PO CAPS: 100.0000 mg | ORAL_CAPSULE | Freq: Two times a day (BID) | ORAL | 0 refills | Status: AC

## 2021-03-07 MED ORDER — LOSARTAN POTASSIUM-HCTZ 100-12.5 MG PO TABS: 1.0000 | ORAL_TABLET | Freq: Every day | ORAL | 1 refills | Status: DC

## 2021-03-07 MED ORDER — NEBIVOLOL HCL 5 MG PO TABS: 5.0000 mg | ORAL_TABLET | Freq: Every day | ORAL | 1 refills | Status: DC

## 2021-03-07 MED ORDER — DULOXETINE HCL 60 MG PO CPEP: 60.0000 mg | ORAL_CAPSULE | Freq: Every day | ORAL | 1 refills | Status: DC

## 2021-03-07 MED ORDER — FAMOTIDINE 20 MG PO TABS: 20.0000 mg | ORAL_TABLET | Freq: Two times a day (BID) | ORAL | 1 refills | Status: DC

## 2021-03-07 NOTE — Progress Notes (Signed)
Date:  03/07/2021   Name:  [name]   DOB:  1955-08-19   MRN:  [id number]   Chief Complaint: Annual Exam (MICRO. UA. Breast Exam.) [name] is a 66 y.o. female who presents today for her Complete Annual Exam. She feels well. She reports exercising - walking the dogs twice daily. She reports she is sleeping poorly. Breast complaints - none.  Mammogram: 03/2020 DEXA: 03/2019 osteopenia Pap smear: discontinued Colonoscopy: 03/2012  Immunization History  Administered Date(s) Administered   Influenza Split 09/21/2014   Influenza,inj,Quad PF,6+ Mos 11/02/2016, 10/28/2017, 11/03/2018, 10/25/2020   Influenza-Unspecified 11/04/2012, 11/04/2016, 11/03/2018, 10/12/2019, 10/25/2020   Moderna Sars-Covid-2 Vaccination 10/12/2019, 11/09/2019   PNEUMOCOCCAL CONJUGATE-20 09/15/2020   Tdap 07/02/2016   Zoster Recombinat (Shingrix) 12/02/2018, 03/08/2019    Hypertension This is a chronic problem. The problem is controlled. Pertinent negatives include no chest pain, headaches, palpitations or shortness of breath. Past treatments include angiotensin blockers and diuretics. The current treatment provides significant improvement. There are no compliance problems.   Depression        This is a chronic problem.The problem is unchanged.  Associated symptoms include no fatigue and no headaches.  Past treatments include SNRIs - Serotonin and norepinephrine reuptake inhibitors. Hyperlipidemia This is a chronic problem. The problem is uncontrolled. There are no known factors aggravating her hyperlipidemia. Pertinent negatives include no chest pain or shortness of breath. She is currently on no antihyperlipidemic treatment.  Diabetes Hypoglycemia symptoms include dizziness. Pertinent negatives for hypoglycemia include no headaches, nervousness/anxiousness or tremors. Pertinent negatives for diabetes include no chest pain, no fatigue, no polydipsia and no polyuria.   Lab Results  Component Value  Date   NA 136 10/11/2020   K 3.6 10/11/2020   CO2 28 10/11/2020   GLUCOSE 106 (H) 10/11/2020   BUN 22 10/11/2020   CREATININE 0.64 10/11/2020   CALCIUM 8.9 10/11/2020   EGFR 94 05/05/2020   GFRNONAA >60 10/11/2020   Lab Results  Component Value Date   CHOL 198 02/09/2020   HDL 37 (L) 02/09/2020   LDLCALC 136 (H) 02/09/2020   LDLDIRECT 161.1 11/08/2010   TRIG 138 02/09/2020   CHOLHDL 5.4 (H) 02/09/2020   Lab Results  Component Value Date   TSH 1.590 02/09/2020   Lab Results  Component Value Date   HGBA1C 6.3 (A) 09/15/2020   Lab Results  Component Value Date   WBC 6.2 10/11/2020   HGB 12.9 10/11/2020   HCT 38.2 10/11/2020   MCV 91.8 10/11/2020   PLT 226 10/11/2020   Lab Results  Component Value Date   ALT 25 05/05/2020   AST 25 05/05/2020   ALKPHOS 137 (H) 05/05/2020   BILITOT 0.3 05/05/2020   Lab Results  Component Value Date   VD25OH 21.5 (L) 02/09/2020     Review of Systems  Constitutional:  Positive for unexpected weight change. Negative for chills, fatigue and fever.  HENT:  Negative for congestion, hearing loss, tinnitus, trouble swallowing and voice change.   Eyes:  Negative for visual disturbance.  Respiratory:  Negative for cough, chest tightness, shortness of breath and wheezing.   Cardiovascular:  Negative for chest pain, palpitations and leg swelling.  Gastrointestinal:  Negative for abdominal pain, constipation, diarrhea and vomiting.       Reflux and gurgling/burning in stomach  Endocrine: Negative for polydipsia and polyuria.  Genitourinary:  Negative for dysuria, frequency, genital sores, vaginal bleeding and vaginal discharge.  Musculoskeletal:  Positive for joint swelling (hands feel tight).  Negative for arthralgias and gait problem.  Skin:  Negative for color change and rash.  Neurological:  Positive for dizziness and numbness (in toes on both feet - feel cold). Negative for tremors, light-headedness and headaches.  Hematological:   Negative for adenopathy. Does not bruise/bleed easily.  Psychiatric/Behavioral:  Positive for depression. Negative for dysphoric mood and sleep disturbance. The patient is not nervous/anxious.    Patient Active Problem List   Diagnosis Date Noted   Rash of entire body 09/28/2020   S/P hysterectomy 02/09/2020   Moderate episode of recurrent major depressive disorder ([place]) 02/09/2020   Neuropathy 07/30/2019   Osteopenia determined by x-ray 04/26/2019   Mixed hyperlipidemia 07/15/2017   Type II diabetes mellitus with complication, uncontrolled 04/18/2017   Vitamin D deficiency 04/18/2017   Sleep apnea 02/28/2015   Obesity 02/28/2015   Routine general medical examination at a health care facility 08/04/2013   Hemorrhoids 03/04/2012   Insomnia 03/04/2012   Arthralgia 03/04/2012   Migraine without aura or status migrainosus 03/04/2012   Arthralgia of right temporomandibular joint 03/04/2012   Essential hypertension 11/08/2010    Allergies  Allergen Reactions   Amlodipine Swelling    And headache    Past Surgical History:  Procedure Laterality Date   ABDOMINAL HYSTERECTOMY  2008   for Menorrhagia   BREAST REDUCTION SURGERY     COLONOSCOPY     LAPAROSCOPY     OPEN REDUCTION INTERNAL FIXATION (ORIF) DISTAL RADIAL FRACTURE Right 10/12/2020   Procedure: OPEN REDUCTION INTERNAL FIXATION (ORIF) DISTAL RADIAL FRACTURE;  Surgeon: Hessie Knows, MD;  Location: [facility] ORS;  Service: Orthopedics;  Laterality: Right;   REDUCTION MAMMAPLASTY Bilateral 1991    Social History   Tobacco Use   Smoking status: Never   Smokeless tobacco: Never  Vaping Use   Vaping Use: Never used  Substance Use Topics   Alcohol use: Not Currently    Alcohol/week: 2.0 standard drinks    Types: 2 Glasses of wine per week   Drug use: Never     Medication list has been reviewed and updated.  Current Meds  Medication Sig   diazepam (VALIUM) 5 MG tablet Take 1 tablet (5 mg total) by mouth every 6 (six)  hours as needed for anxiety.   DULoxetine (CYMBALTA) 30 MG capsule Take 1 capsule (30 mg total) by mouth daily. (Patient taking differently: Take 30 mg by mouth daily as needed (anxiety).)   DULoxetine (CYMBALTA) 60 MG capsule Take 1 capsule (60 mg total) by mouth daily.   glucose blood (ONETOUCH ULTRA) test strip Use to test blood sugar twice a day   losartan-hydrochlorothiazide (HYZAAR) 100-12.5 MG tablet Take 1 tablet by mouth daily.   vitamin B-12 (CYANOCOBALAMIN) 1000 MCG tablet Take 1,000 mcg by mouth daily.   zolpidem (AMBIEN) 10 MG tablet Take 1 tablet (10 mg total) by mouth at bedtime as needed for sleep.    PHQ 2/9 Scores 03/07/2021 09/28/2020 09/15/2020 05/05/2020  PHQ - 2 Score 1 0 1 0  PHQ- 9 Score _0 0    GAD 7 : Generalized Anxiety Score 03/07/2021 09/28/2020 09/15/2020 03/29/2020  Nervous, Anxious, on Edge 0 0 1 0  Control/stop worrying 0 0 0 0  Worry too much - different things 0 0 0 0  Trouble relaxing 1 0 1 0  Restless 0 0 0 0  Easily annoyed or irritable 1 0 1 0  Afraid - awful might happen 0 0 0 0  Total GAD 7 Score 2 0  3 0  Anxiety Difficulty Not difficult at all Not difficult at all - -    BP Readings from Last 3 Encounters:  03/07/21 128/72  10/12/20 135/87  10/08/20 (!) 159/75    Physical Exam Vitals and nursing note reviewed.  Constitutional:      General: She is not in acute distress.    Appearance: She is well-developed.  HENT:     Head: Normocephalic and atraumatic.     Right Ear: Tympanic membrane and ear canal normal.     Left Ear: Tympanic membrane and ear canal normal.     Nose:     Right Sinus: No maxillary sinus tenderness.     Left Sinus: No maxillary sinus tenderness.  Eyes:     General: No scleral icterus.       Right eye: No discharge.        Left eye: No discharge.     Conjunctiva/sclera: Conjunctivae normal.  Neck:     Thyroid: No thyromegaly.     Vascular: No carotid bruit.  Cardiovascular:     Rate and Rhythm: Normal rate and  regular rhythm.     Pulses:          Radial pulses are 2+ on the right side and 2+ on the left side.       Dorsalis pedis pulses are 0 on the right side and 0 on the left side.       Posterior tibial pulses are 1+ on the right side and 1+ on the left side.     Heart sounds: Normal heart sounds.  Pulmonary:     Effort: Pulmonary effort is normal. No respiratory distress.     Breath sounds: No wheezing.  Chest:  Breasts:    Right: No mass, nipple discharge, skin change or tenderness.     Left: No mass, nipple discharge, skin change or tenderness.     Comments: Healed breast reduction scars bilaterally Abdominal:     General: Bowel sounds are normal.     Palpations: Abdomen is soft.     Tenderness: There is abdominal tenderness in the left lower quadrant. There is no right CVA tenderness, left CVA tenderness, guarding or rebound.  Musculoskeletal:     Right hand: Swelling (mild soft tissue swelling s/p 2 surgeries) present.     Left hand: Normal.     Cervical back: Normal range of motion. No erythema.     Right lower leg: No edema.     Left lower leg: No edema.  Lymphadenopathy:     Cervical: No cervical adenopathy.  Skin:    General: Skin is warm and dry.     Findings: No rash.  Neurological:     Mental Status: She is alert and oriented to person, place, and time.     Cranial Nerves: No cranial nerve deficit.     Sensory: Sensory deficit present.     Deep Tendon Reflexes: Reflexes are normal and symmetric.  Psychiatric:        Attention and Perception: Attention normal.        Mood and Affect: Mood normal.    Wt Readings from Last 3 Encounters:  03/07/21 195 lb 3.2 oz (88.5 kg)  10/12/20 180 lb (81.6 kg)  09/28/20 180 lb (81.6 kg)    BP 128/72    Pulse 74    Ht 5' 4.5" (1.638 m)    Wt 195 lb 3.2 oz (88.5 kg)    SpO2 97%    BMI  32.99 kg/m   Assessment and Plan: 1. Annual physical exam Exam is normal except for weight. Encourage regular exercise and appropriate dietary  changes. Limit sodium to reduce hand swelling/elevate if needed  2. Encounter for screening mammogram for breast cancer Schedule at [facility]  3. Essential hypertension Clinically stable exam with well controlled BP. Tolerating medications without side effects at this time. Pt to continue current regimen and low sodium diet; benefits of regular exercise as able discussed. - CBC with Differential/Platelet - Comprehensive metabolic panel - TSH - POCT urinalysis dipstick - losartan-hydrochlorothiazide (HYZAAR) 100-12.5 MG tablet; Take 1 tablet by mouth daily.  Dispense: 90 tablet; Refill: 1  4. Mixed hyperlipidemia Not currently on medication - Lipid panel  5. Prediabetes Likely worsening due to weight gain. Will advise after labs return - Hemoglobin A1c - Microalbumin / creatinine urine ratio - POCT UA - Microscopic Only  6. Moderate episode of recurrent major depressive disorder (HCC) Clinically stable on current regimen with good control of symptoms, No SI or HI. Will continue current therapy. - DULoxetine (CYMBALTA) 60 MG capsule; Take 1 capsule (60 mg total) by mouth daily.  Dispense: 90 capsule; Refill: 1  7. Gastroesophageal reflux disease without esophagitis Symptoms of reflux and dyspepsia are new. No red flag signs such as weight loss, n/v, melena. Some gastric churning and hunger as well. Will begin bid H2 blocker. - famotidine (PEPCID) 20 MG tablet; Take 1 tablet (20 mg total) by mouth 2 (two) times daily.  Dispense: 60 tablet; Refill: 1  8. Acute cystitis without hematuria - nitrofurantoin, macrocrystal-monohydrate, (MACROBID) 100 MG capsule; Take 1 capsule (100 mg total) by mouth 2 (two) times daily for 5 days.  Dispense: 10 capsule; Refill: 0   Partially dictated using Editor, commissioning. Any errors are unintentional.  Halina Maidens, MD [employer]  03/07/2021

## 2021-03-07 NOTE — Telephone Encounter (Signed)
Completed PA for Zolpidem 10 mg daily- 30 for 30 days.  Key: [id number]   PA Case ID: [id number]  Waiting for outcome.

## 2021-03-08 ENCOUNTER — Telehealth: Payer: Self-pay

## 2021-03-08 ENCOUNTER — Other Ambulatory Visit: Payer: Self-pay | Admitting: Internal Medicine

## 2021-03-08 DIAGNOSIS — R7303 Prediabetes: Secondary | ICD-10-CM

## 2021-03-08 LAB — LIPID PANEL
Chol/HDL Ratio: 4.8 ratio — ABNORMAL HIGH (ref 0.0–4.4)
Cholesterol, Total: 241 mg/dL — ABNORMAL HIGH (ref 100–199)
HDL: 50 mg/dL (ref >39)
LDL Chol Calc (NIH): 163 mg/dL — ABNORMAL HIGH (ref 0–99)
Triglycerides: 155 mg/dL — ABNORMAL HIGH (ref 0–149)
VLDL Cholesterol Cal: 28 mg/dL (ref 5–40)

## 2021-03-08 LAB — CBC WITH DIFFERENTIAL/PLATELET
Basophils Absolute: 0 10*3/uL (ref 0.0–0.2)
Basos: 1 %
EOS (ABSOLUTE): 0.1 10*3/uL (ref 0.0–0.4)
Eos: 1 %
Hematocrit: 41.6 % (ref 34.0–46.6)
Hemoglobin: 13.8 g/dL (ref 11.1–15.9)
Immature Grans (Abs): 0 10*3/uL (ref 0.0–0.1)
Immature Granulocytes: 0 %
Lymphocytes Absolute: 1.3 10*3/uL (ref 0.7–3.1)
Lymphs: 20 %
MCH: 29.8 pg (ref 26.6–33.0)
MCHC: 33.2 g/dL (ref 31.5–35.7)
MCV: 90 fL (ref 79–97)
Monocytes Absolute: 0.4 10*3/uL (ref 0.1–0.9)
Monocytes: 7 %
Neutrophils Absolute: 4.8 10*3/uL (ref 1.4–7.0)
Neutrophils: 71 %
Platelets: 298 10*3/uL (ref 150–450)
RBC: 4.63 x10E6/uL (ref 3.77–5.28)
RDW: 11.8 % (ref 11.7–15.4)
WBC: 6.7 10*3/uL (ref 3.4–10.8)

## 2021-03-08 LAB — COMPREHENSIVE METABOLIC PANEL
ALT: 21 IU/L (ref 0–32)
AST: 24 IU/L (ref 0–40)
Albumin/Globulin Ratio: 1.7 (ref 1.2–2.2)
Albumin: 4.5 g/dL (ref 3.8–4.8)
Alkaline Phosphatase: 143 IU/L — ABNORMAL HIGH (ref 44–121)
BUN/Creatinine Ratio: 25 (ref 12–28)
BUN: 18 mg/dL (ref 8–27)
Bilirubin Total: 0.5 mg/dL (ref 0.0–1.2)
CO2: 26 mmol/L (ref 20–29)
Calcium: 9.7 mg/dL (ref 8.7–10.3)
Chloride: 99 mmol/L (ref 96–106)
Creatinine, Ser: 0.73 mg/dL (ref 0.57–1.00)
Globulin, Total: 2.7 g/dL (ref 1.5–4.5)
Glucose: 142 mg/dL — ABNORMAL HIGH (ref 70–99)
Potassium: 4.3 mmol/L (ref 3.5–5.2)
Sodium: 138 mmol/L (ref 134–144)
Total Protein: 7.2 g/dL (ref 6.0–8.5)
eGFR: 92 mL/min/{1.73_m2} (ref >59)

## 2021-03-08 LAB — HEMOGLOBIN A1C
Est. average glucose Bld gHb Est-mCnc: 151 mg/dL
Hgb A1c MFr Bld: 6.9 % — ABNORMAL HIGH (ref 4.8–5.6)

## 2021-03-08 LAB — MICROALBUMIN / CREATININE URINE RATIO
Creatinine, Urine: 152 mg/dL
Microalb/Creat Ratio: 10 mg/g creat (ref 0–29)
Microalbumin, Urine: 15.3 ug/mL

## 2021-03-08 LAB — TSH: TSH: 1.6 u[IU]/mL (ref 0.450–4.500)

## 2021-03-08 MED ORDER — METFORMIN HCL ER 500 MG PO TB24: 500.0000 mg | ORAL_TABLET | Freq: Two times a day (BID) | ORAL | 1 refills | Status: DC

## 2021-03-08 NOTE — Telephone Encounter (Signed)
Copied from [place] [id number]. Topic: General - Other >> Mar 08, 2021  1:34 PM Valere Dross wrote: Reason for CRM: Pharmacy called in on behalf of pt stated the  Zolpidem 10 mg is still no showing as approved, please advise

## 2021-03-08 NOTE — Telephone Encounter (Signed)
Patients PA is approved through 03/07/2024.

## 2021-03-08 NOTE — Telephone Encounter (Signed)
Did not do another PA it says 30 for 30 days approved. The new PA that was sent was for 30 fpr 30 days as well.  KP

## 2021-03-08 NOTE — Progress Notes (Signed)
Informed pt with labs per Dr. Army Melia. Pt verbalized understanding. Pt does not want to take Metfomin. Pt will makes a decision by her next appointment about taking cholesterol medication. Told pt when cholesterol is high it can increase percentage for CAD.  KP

## 2021-03-08 NOTE — Telephone Encounter (Signed)
Spoke to pt let her know that I would send a message to [facility] as well as complete the new PA that was sent over from pharmacy.  KP

## 2021-03-09 ENCOUNTER — Other Ambulatory Visit: Payer: Self-pay

## 2021-03-09 DIAGNOSIS — E119 Type 2 diabetes mellitus without complications: Secondary | ICD-10-CM

## 2021-03-09 MED ORDER — DAPAGLIFLOZIN PROPANEDIOL 10 MG PO TABS: 10.0000 mg | ORAL_TABLET | Freq: Every day | ORAL | 2 refills | Status: DC

## 2021-03-09 NOTE — Progress Notes (Signed)
Called pt left VM to call back. Please read pt Dr. Jaclynn Guarneri message.  [facility] nurse may give results to patient if they return call to clinic, a CRM has been created.  KP

## 2021-03-09 NOTE — Telephone Encounter (Signed)
Spoke to pt left VM seeing if patient will take another medication for her DM. Pt does not want to take metfomin.  [facility] nurse may give results to patient if they return call to clinic, a CRM has been created.  KP

## 2021-03-09 NOTE — Telephone Encounter (Signed)
Patient returned call and was provided with response- she has further questions- call transferred to office

## 2021-03-09 NOTE — Telephone Encounter (Addendum)
Pt returned missed call from [facility] / pt is going into another appt and asked if Mariann Barter can leave a message

## 2021-03-09 NOTE — Telephone Encounter (Signed)
Pt is not out of medication at this time.  KP

## 2021-03-09 NOTE — Telephone Encounter (Signed)
Spoke to pt let her know that we are working on getting medication approved 30 for 30 days. Pt verbalized understanding.  KP

## 2021-03-09 NOTE — Progress Notes (Signed)
Spoke to pt she would like to start Farxiga 10 MG. Will send in medication.  KP

## 2021-03-09 NOTE — Progress Notes (Signed)
Add medication

## 2021-03-12 ENCOUNTER — Telehealth: Payer: Self-pay

## 2021-03-12 ENCOUNTER — Telehealth: Payer: Self-pay | Admitting: Internal Medicine

## 2021-03-12 NOTE — Telephone Encounter (Signed)
Pt stated she was only able to take 2 tablets. Pt stated she is not having any symptoms.   KP

## 2021-03-12 NOTE — Telephone Encounter (Signed)
Approved 03/12/2021-03/12/2022

## 2021-03-12 NOTE — Telephone Encounter (Signed)
Please review.  KP

## 2021-03-12 NOTE — Telephone Encounter (Signed)
Completed PA on [email or web address] for Farxiga 10 mg.   Key: [id number] - PA Case ID: [id number] - Rx #: [id number]  Awaiting outcome.

## 2021-03-12 NOTE — Telephone Encounter (Signed)
Noted  Pt is aware to not take any further medication unless called.  KP

## 2021-03-12 NOTE — Telephone Encounter (Signed)
Patient called stating she had a bad reaction to medication given for her bladder infection. She experienced dizziness, nausea, diarrhea, and headaches. She would like something different called in. She would also like a callback from the nurse please.

## 2021-04-07 ENCOUNTER — Other Ambulatory Visit: Payer: Self-pay | Admitting: Internal Medicine

## 2021-04-07 DIAGNOSIS — F331 Major depressive disorder, recurrent, moderate: Secondary | ICD-10-CM

## 2021-04-09 NOTE — Telephone Encounter (Signed)
Refilled 03/07/2021 #90 1 refill - 6 month supply. ?Requested Prescriptions  ?Pending Prescriptions Disp Refills  ?? DULoxetine (CYMBALTA) 30 MG capsule [Pharmacy Med Name: DULOXETINE HCL DR 30 MG CAP] 90 capsule 0  ?  Sig: Take 1 capsule (30 mg total) by mouth daily.  ?  ? Psychiatry: Antidepressants - SNRI - duloxetine Passed - 04/07/2021  9:52 AM  ?  ?  Passed - Cr in normal range and within 360 days  ?  Creatinine, Ser  ?Date Value Ref Range Status  ?03/07/2021 0.73 0.57 - 1.00 mg/dL Final  ?   ?  ?  Passed - eGFR is 30 or above and within 360 days  ?  GFR calc Af Amer  ?Date Value Ref Range Status  ?02/09/2020 107 >59 mL/min/1.73 Final  ?  Comment:  ?  **In accordance with recommendations from the NKF-ASN Task force,** ?  [facility] is in the process of updating its eGFR calculation to the ?  2021 CKD-EPI creatinine equation that estimates kidney function ?  without a race variable. ?  ? ?GFR, Estimated  ?Date Value Ref Range Status  ?10/11/2020 >60 >60 mL/min Final  ?  Comment:  ?  (NOTE) ?Calculated using the CKD-EPI Creatinine Equation (2021) ?  ? ?GFR  ?Date Value Ref Range Status  ?11/08/2010 124.82 >60.00 mL/min Final  ? ?eGFR  ?Date Value Ref Range Status  ?03/07/2021 92 >59 mL/min/1.73 Final  ?   ?  ?  Passed - Completed PHQ-2 or PHQ-9 in the last 360 days  ?  ?  Passed - Last BP in normal range  ?  BP Readings from Last 1 Encounters:  ?03/07/21 128/72  ?   ?  ?  Passed - Valid encounter within last 6 months  ?  Recent Outpatient Visits   ?      ? 1 month ago Annual physical exam  ? [facility] Glean Hess, MD  ? 6 months ago Rash of entire body  ? [facility] Montel Culver, MD  ? 6 months ago Type II diabetes mellitus with complication, uncontrolled ([place])  ? [facility] Glean Hess, MD  ? 11 months ago Type II diabetes mellitus with complication, uncontrolled ([place])  ? [facility] Glean Hess, MD  ? 1 year ago Annual physical exam  ? [facility] Glean Hess, MD  ?  ?  ?Future Appointments   ?        ? In 2 months Army Melia Jesse Sans, MD [facility], [place]  ?  ? ?  ?  ?  ? ?

## 2021-04-16 ENCOUNTER — Other Ambulatory Visit: Payer: Self-pay

## 2021-04-16 MED ORDER — DULOXETINE HCL 30 MG PO CPEP: 30.0000 mg | ORAL_CAPSULE | Freq: Every day | ORAL | 1 refills | Status: DC

## 2021-04-27 ENCOUNTER — Ambulatory Visit
Admission: RE | Admit: 2021-04-27 | Discharge: 2021-04-27 | Disposition: A | Discharge status: Home / Self Care | Payer: 59 | Source: Ambulatory Visit | Attending: Internal Medicine | Admitting: Internal Medicine

## 2021-04-27 DIAGNOSIS — Z1231 Encounter for screening mammogram for malignant neoplasm of breast: Secondary | ICD-10-CM | POA: Insufficient documentation

## 2021-06-08 ENCOUNTER — Other Ambulatory Visit: Payer: Self-pay | Admitting: Internal Medicine

## 2021-06-08 DIAGNOSIS — F5101 Primary insomnia: Secondary | ICD-10-CM

## 2021-06-08 DIAGNOSIS — E119 Type 2 diabetes mellitus without complications: Secondary | ICD-10-CM

## 2021-06-11 NOTE — Telephone Encounter (Signed)
Requested Prescriptions  ?Pending Prescriptions Disp Refills  ?? FARXIGA 10 MG TABS tablet [Pharmacy Med Name: FARXIGA 10 MG TABLET] 30 tablet 0  ?  Sig: Take 1 tablet (10 mg total) by mouth dailybefore breakfast.  ?  ? Endocrinology:  Diabetes - SGLT2 Inhibitors Passed - 06/08/2021  9:20 AM  ?  ?  Passed - Cr in normal range and within 360 days  ?  Creatinine, Ser  ?Date Value Ref Range Status  ?03/07/2021 0.73 0.57 - 1.00 mg/dL Final  ?   ?  ?  Passed - HBA1C is between 0 and 7.9 and within 180 days  ?  Hemoglobin A1C  ?Date Value Ref Range Status  ?05/05/2020 6.7  Final  ? ?Hgb A1c MFr Bld  ?Date Value Ref Range Status  ?03/07/2021 6.9 (H) 4.8 - 5.6 % Final  ?  Comment:  ?           Prediabetes: 5.7 - 6.4 ?         Diabetes: >6.4 ?         Glycemic control for adults with diabetes: <7.0 ?  ?   ?  ?  Passed - eGFR in normal range and within 360 days  ?  GFR calc Af Amer  ?Date Value Ref Range Status  ?02/09/2020 107 >59 mL/min/1.73 Final  ?  Comment:  ?  **In accordance with recommendations from the NKF-ASN Task force,** ?  [facility] is in the process of updating its eGFR calculation to the ?  2021 CKD-EPI creatinine equation that estimates kidney function ?  without a race variable. ?  ? ?GFR, Estimated  ?Date Value Ref Range Status  ?10/11/2020 >60 >60 mL/min Final  ?  Comment:  ?  (NOTE) ?Calculated using the CKD-EPI Creatinine Equation (2021) ?  ? ?GFR  ?Date Value Ref Range Status  ?11/08/2010 124.82 >60.00 mL/min Final  ? ?eGFR  ?Date Value Ref Range Status  ?03/07/2021 92 >59 mL/min/1.73 Final  ?   ?  ?  Passed - Valid encounter within last 6 months  ?  Recent Outpatient Visits   ?      ? 3 months ago Annual physical exam  ? [facility] Glean Hess, MD  ? 8 months ago Rash of entire body  ? [facility] Montel Culver, MD  ? 8 months ago Type II diabetes mellitus with complication, uncontrolled ([place])  ? [facility] Glean Hess, MD  ? 1 year ago Type II diabetes  mellitus with complication, uncontrolled ([place])  ? [facility] Glean Hess, MD  ? 1 year ago Annual physical exam  ? [facility] Glean Hess, MD  ?  ?  ?Future Appointments   ?        ? In 3 weeks Army Melia Jesse Sans, MD [facility]  ?  ? ?  ?  ?  ?? zolpidem (AMBIEN) 10 MG tablet [Pharmacy Med Name: ZOLPIDEM TARTRATE 10 MG TABLET] 30 tablet   ?  Sig: Take 1 tablet (10 mg total) by mouth at bedtime asneeded for sleep.  ?  ? Not Delegated - Psychiatry:  Anxiolytics/Hypnotics Failed - 06/08/2021  9:20 AM  ?  ?  Failed - This refill cannot be delegated  ?  ?  Failed - Urine Drug Screen completed in last 360 days  ?  ?  Passed - Valid encounter within last 6 months  ?  Recent Outpatient Visits   ?      ?  3 months ago Annual physical exam  ? [facility] Glean Hess, MD  ? 8 months ago Rash of entire body  ? [facility] Montel Culver, MD  ? 8 months ago Type II diabetes mellitus with complication, uncontrolled ([place])  ? [facility] Glean Hess, MD  ? 1 year ago Type II diabetes mellitus with complication, uncontrolled ([place])  ? [facility] Glean Hess, MD  ? 1 year ago Annual physical exam  ? [facility] Glean Hess, MD  ?  ?  ?Future Appointments   ?        ? In 3 weeks Glean Hess, MD [facility] [place], [place]  ?  ? ?  ?  ?  ? ? ?

## 2021-06-11 NOTE — Telephone Encounter (Signed)
Requested medication (s) are due for refill today: yes ? ?Requested medication (s) are on the active medication list: yes ? ?Last refill:  02/21/21 ? ?Future visit scheduled: yes ? ?Notes to clinic:  med not delegated to NT to RF ? ? ?Requested Prescriptions  ?Pending Prescriptions Disp Refills  ? zolpidem (AMBIEN) 10 MG tablet [Pharmacy Med Name: ZOLPIDEM TARTRATE 10 MG TABLET] 30 tablet   ?  Sig: Take 1 tablet (10 mg total) by mouth at bedtime asneeded for sleep.  ?  ? Not Delegated - Psychiatry:  Anxiolytics/Hypnotics Failed - 06/08/2021  9:20 AM  ?  ?  Failed - This refill cannot be delegated  ?  ?  Failed - Urine Drug Screen completed in last 360 days  ?  ?  Passed - Valid encounter within last 6 months  ?  Recent Outpatient Visits   ? ?      ? 3 months ago Annual physical exam  ? [facility] Glean Hess, MD  ? 8 months ago Rash of entire body  ? [facility] Montel Culver, MD  ? 8 months ago Type II diabetes mellitus with complication, uncontrolled ([place])  ? [facility] Glean Hess, MD  ? 1 year ago Type II diabetes mellitus with complication, uncontrolled ([place])  ? [facility] Glean Hess, MD  ? 1 year ago Annual physical exam  ? [facility] Glean Hess, MD  ? ?  ?  ?Future Appointments   ? ?        ? In 3 weeks Glean Hess, MD [facility], [place]  ? ?  ? ? ?  ?  ?  ?Signed Prescriptions Disp Refills  ? FARXIGA 10 MG TABS tablet 30 tablet 0  ?  Sig: Take 1 tablet (10 mg total) by mouth dailybefore breakfast.  ?  ? Endocrinology:  Diabetes - SGLT2 Inhibitors Passed - 06/08/2021  9:20 AM  ?  ?  Passed - Cr in normal range and within 360 days  ?  Creatinine, Ser  ?Date Value Ref Range Status  ?03/07/2021 0.73 0.57 - 1.00 mg/dL Final  ?   ?  ?  Passed - HBA1C is between 0 and 7.9 and within 180 days  ?  Hemoglobin A1C  ?Date Value Ref Range Status  ?05/05/2020 6.7  Final  ? ?Hgb A1c MFr Bld  ?Date Value Ref Range Status   ?03/07/2021 6.9 (H) 4.8 - 5.6 % Final  ?  Comment:  ?           Prediabetes: 5.7 - 6.4 ?         Diabetes: >6.4 ?         Glycemic control for adults with diabetes: <7.0 ?  ?   ?  ?  Passed - eGFR in normal range and within 360 days  ?  GFR calc Af Amer  ?Date Value Ref Range Status  ?02/09/2020 107 >59 mL/min/1.73 Final  ?  Comment:  ?  **In accordance with recommendations from the NKF-ASN Task force,** ?  [facility] is in the process of updating its eGFR calculation to the ?  2021 CKD-EPI creatinine equation that estimates kidney function ?  without a race variable. ?  ? ?GFR, Estimated  ?Date Value Ref Range Status  ?10/11/2020 >60 >60 mL/min Final  ?  Comment:  ?  (NOTE) ?Calculated using the CKD-EPI Creatinine Equation (2021) ?  ? ?GFR  ?Date  Value Ref Range Status  ?11/08/2010 124.82 >60.00 mL/min Final  ? ?eGFR  ?Date Value Ref Range Status  ?03/07/2021 92 >59 mL/min/1.73 Final  ?   ?  ?  Passed - Valid encounter within last 6 months  ?  Recent Outpatient Visits   ? ?      ? 3 months ago Annual physical exam  ? [facility] Glean Hess, MD  ? 8 months ago Rash of entire body  ? [facility] Montel Culver, MD  ? 8 months ago Type II diabetes mellitus with complication, uncontrolled ([place])  ? [facility] Glean Hess, MD  ? 1 year ago Type II diabetes mellitus with complication, uncontrolled ([place])  ? [facility] Glean Hess, MD  ? 1 year ago Annual physical exam  ? [facility] Glean Hess, MD  ? ?  ?  ?Future Appointments   ? ?        ? In 3 weeks Glean Hess, MD [facility], [place]  ? ?  ? ? ?  ?  ?  ? ? ? ? ?

## 2021-06-11 NOTE — Telephone Encounter (Signed)
Please review. Last office visit 03/07/21. ? ?KP

## 2021-07-06 ENCOUNTER — Ambulatory Visit (INDEPENDENT_AMBULATORY_CARE_PROVIDER_SITE_OTHER): Payer: PPO | Admitting: Internal Medicine

## 2021-07-06 ENCOUNTER — Encounter: Payer: Self-pay | Admitting: Internal Medicine

## 2021-07-06 VITALS — BP 136/92 | HR 77 | Ht 64.5 in | Wt 195.8 lb

## 2021-07-06 DIAGNOSIS — E782 Mixed hyperlipidemia: Secondary | ICD-10-CM

## 2021-07-06 DIAGNOSIS — K219 Gastro-esophageal reflux disease without esophagitis: Secondary | ICD-10-CM

## 2021-07-06 DIAGNOSIS — I10 Essential (primary) hypertension: Secondary | ICD-10-CM

## 2021-07-06 DIAGNOSIS — E118 Type 2 diabetes mellitus with unspecified complications: Secondary | ICD-10-CM | POA: Diagnosis not present

## 2021-07-06 DIAGNOSIS — F331 Major depressive disorder, recurrent, moderate: Secondary | ICD-10-CM

## 2021-07-06 MED ORDER — FAMOTIDINE 20 MG PO TABS: 20.0000 mg | ORAL_TABLET | Freq: Two times a day (BID) | ORAL | 1 refills | Status: DC

## 2021-07-06 MED ORDER — DULOXETINE HCL 60 MG PO CPEP: 120.0000 mg | ORAL_CAPSULE | Freq: Every day | ORAL | 0 refills | Status: DC

## 2021-07-06 NOTE — Progress Notes (Signed)
Date:  07/06/2021   Name:  [name]   DOB:  [birth year]   MRN:  [id number]   Chief Complaint: Diabetes  Diabetes She presents for her follow-up diabetic visit. She has type 2 diabetes mellitus. The initial diagnosis of diabetes was made 4 months ago. Her disease course has been improving. Hypoglycemia symptoms include nervousness/anxiousness. Pertinent negatives for hypoglycemia include no headaches or tremors. Associated symptoms include fatigue. Pertinent negatives for diabetes include no chest pain, no polydipsia and no polyuria. Current diabetic treatment includes oral agent (monotherapy) (farxiga started last visit). Her weight is increasing steadily. She is following a generally unhealthy diet. When asked about meal planning, she reported none. An ACE inhibitor/angiotensin II receptor blocker is being taken.  Hypertension This is a chronic problem. The problem is controlled. Pertinent negatives include no chest pain, headaches, palpitations or shortness of breath. Past treatments include angiotensin blockers, diuretics and beta blockers. The current treatment provides significant improvement.  Hyperlipidemia This is a chronic problem. The problem is uncontrolled. Pertinent negatives include no chest pain or shortness of breath.  Depression        This is a chronic problem.  The problem has been gradually worsening since onset.  Associated symptoms include fatigue, hopelessness, insomnia, irritable, decreased interest and sad.  Associated symptoms include no appetite change, no headaches and no suicidal ideas.  Past treatments include SNRIs - Serotonin and norepinephrine reuptake inhibitors (cymbalta 60 mg).   Lab Results  Component Value Date   NA 138 03/07/2021   K 4.3 03/07/2021   CO2 26 03/07/2021   GLUCOSE 142 (H) 03/07/2021   BUN 18 03/07/2021   CREATININE 0.73 03/07/2021   CALCIUM 9.7 03/07/2021   EGFR 92 03/07/2021   GFRNONAA >60 10/11/2020   Lab Results   Component Value Date   CHOL 241 (H) 03/07/2021   HDL 50 03/07/2021   LDLCALC 163 (H) 03/07/2021   LDLDIRECT 161.1 11/08/2010   TRIG 155 (H) 03/07/2021   CHOLHDL 4.8 (H) 03/07/2021   Lab Results  Component Value Date   TSH 1.600 03/07/2021   Lab Results  Component Value Date   HGBA1C 6.9 (H) 03/07/2021   Lab Results  Component Value Date   WBC 6.7 03/07/2021   HGB 13.8 03/07/2021   HCT 41.6 03/07/2021   MCV 90 03/07/2021   PLT 298 03/07/2021   Lab Results  Component Value Date   ALT 21 03/07/2021   AST 24 03/07/2021   ALKPHOS 143 (H) 03/07/2021   BILITOT 0.5 03/07/2021   Lab Results  Component Value Date   VD25OH 21.5 (L) 02/09/2020     Review of Systems  Constitutional:  Positive for fatigue. Negative for appetite change, fever and unexpected weight change.  HENT:  Negative for tinnitus and trouble swallowing.   Eyes:  Negative for visual disturbance.  Respiratory:  Negative for cough, chest tightness and shortness of breath.   Cardiovascular:  Negative for chest pain, palpitations and leg swelling.  Gastrointestinal:  Negative for abdominal pain.  Endocrine: Negative for polydipsia and polyuria.  Genitourinary:  Negative for dysuria and hematuria.  Musculoskeletal:  Negative for arthralgias.  Neurological:  Negative for tremors, numbness and headaches.  Psychiatric/Behavioral:  Positive for depression, dysphoric mood and sleep disturbance. Negative for suicidal ideas. The patient is nervous/anxious and has insomnia.     Patient Active Problem List   Diagnosis Date Noted   Rash of entire body 09/28/2020   S/P hysterectomy 02/09/2020   Moderate episode  of recurrent major depressive disorder ([place]) 02/09/2020   Neuropathy 07/30/2019   Osteopenia determined by x-ray 04/26/2019   Mixed hyperlipidemia 07/15/2017   Type II diabetes mellitus with complication ([place]) 32/12/2480   Vitamin D deficiency 04/18/2017   Sleep apnea 02/28/2015   Obesity 02/28/2015    Routine general medical examination at a health care facility 08/04/2013   Hemorrhoids 03/04/2012   Insomnia 03/04/2012   Arthralgia 03/04/2012   Migraine without aura or status migrainosus 03/04/2012   Arthralgia of right temporomandibular joint 03/04/2012   Essential hypertension 11/08/2010    Allergies  Allergen Reactions   Amlodipine Swelling    And headache    Past Surgical History:  Procedure Laterality Date   ABDOMINAL HYSTERECTOMY  2008   for Menorrhagia   BREAST REDUCTION SURGERY     COLONOSCOPY     LAPAROSCOPY     OPEN REDUCTION INTERNAL FIXATION (ORIF) DISTAL RADIAL FRACTURE Right 10/12/2020   Procedure: OPEN REDUCTION INTERNAL FIXATION (ORIF) DISTAL RADIAL FRACTURE;  Surgeon: Hessie Knows, MD;  Location: [facility] ORS;  Service: Orthopedics;  Laterality: Right;   REDUCTION MAMMAPLASTY Bilateral 1991    Social History   Tobacco Use   Smoking status: Never   Smokeless tobacco: Never  Vaping Use   Vaping Use: Never used  Substance Use Topics   Alcohol use: Not Currently    Alcohol/week: 2.0 standard drinks of alcohol    Types: 2 Glasses of wine per week   Drug use: Never     Medication list has been reviewed and updated.  Current Meds  Medication Sig   cholecalciferol (VITAMIN D3) 25 MCG (1000 UNIT) tablet Take 2,000 Units by mouth daily.   diazepam (VALIUM) 5 MG tablet Take 1 tablet (5 mg total) by mouth every 6 (six) hours as needed for anxiety.   DULoxetine (CYMBALTA) 30 MG capsule Take 1 capsule (30 mg total) by mouth daily.   FARXIGA 10 MG TABS tablet Take 1 tablet (10 mg total) by mouth dailybefore breakfast.   glucose blood (ONETOUCH ULTRA) test strip Use to test blood sugar twice a day   losartan-hydrochlorothiazide (HYZAAR) 100-12.5 MG tablet Take 1 tablet by mouth daily.   nebivolol (BYSTOLIC) 5 MG tablet Take 1 tablet (5 mg total) by mouth daily.   zolpidem (AMBIEN) 10 MG tablet Take 1 tablet (10 mg total) by mouth at bedtime asneeded for sleep.    [DISCONTINUED] DULoxetine (CYMBALTA) 60 MG capsule Take 60 mg by mouth daily.   [DISCONTINUED] famotidine (PEPCID) 20 MG tablet Take 1 tablet (20 mg total) by mouth 2 (two) times daily.       07/06/2021   10:29 AM 03/07/2021   10:37 AM 09/28/2020    4:35 PM 09/15/2020   10:29 AM  GAD 7 : Generalized Anxiety Score  Nervous, Anxious, on Edge 2 0 0 1  Control/stop worrying 1 0 0 0  Worry too much - different things 2 0 0 0  Trouble relaxing 2 1 0 1  Restless 0 0 0 0  Easily annoyed or irritable 3 1 0 1  Afraid - awful might happen 1 0 0 0  Total GAD 7 Score 11 2 0 3  Anxiety Difficulty Very difficult Not difficult at all Not difficult at all        07/06/2021   10:29 AM  Depression screen PHQ 2/9  Decreased Interest 3  Down, Depressed, Hopeless 3  PHQ - 2 Score 6  Altered sleeping 3  Tired, decreased energy 3  Change in appetite 3  Feeling bad or failure about yourself  3  Trouble concentrating 2  Moving slowly or fidgety/restless 2  Suicidal thoughts 0  PHQ-9 Score 22  Difficult doing work/chores Very difficult    BP Readings from Last 3 Encounters:  07/06/21 (!) 136/92  03/07/21 128/72  10/12/20 135/87    Physical Exam Vitals and nursing note reviewed.  Constitutional:      General: She is irritable. She is not in acute distress.    Appearance: Normal appearance. She is well-developed.  HENT:     Head: Normocephalic and atraumatic.  Cardiovascular:     Rate and Rhythm: Normal rate and regular rhythm.     Pulses: Normal pulses.     Heart sounds: No murmur heard. Pulmonary:     Effort: Pulmonary effort is normal. No respiratory distress.     Breath sounds: No wheezing or rhonchi.  Musculoskeletal:     Cervical back: Normal range of motion.     Right lower leg: No edema.     Left lower leg: No edema.  Lymphadenopathy:     Cervical: No cervical adenopathy.  Skin:    General: Skin is warm and dry.     Capillary Refill: Capillary refill takes less than 2 seconds.      Findings: No rash.  Neurological:     Mental Status: She is alert and oriented to person, place, and time.  Psychiatric:        Attention and Perception: Attention normal.        Mood and Affect: Mood is depressed. Affect is tearful.        Speech: Speech normal.        Behavior: Behavior normal.        Thought Content: Thought content does not include suicidal ideation. Thought content does not include suicidal plan.     Wt Readings from Last 3 Encounters:  07/06/21 195 lb 12.8 oz (88.8 kg)  03/07/21 195 lb 3.2 oz (88.5 kg)  10/12/20 180 lb (81.6 kg)    BP (!) 136/92 (BP Location: Right Arm, Cuff Size: Large)   Pulse 77   Ht 5' 4.5" (1.638 m)   Wt 195 lb 12.8 oz (88.8 kg)   SpO2 95%   BMI 33.09 kg/m   Assessment and Plan: 1. Type II diabetes mellitus with complication ([place]) Significant weight gain since starting Farxiga - she feels hungry all the time. No yeast infection symptoms. No chest pain.  Has taken metformin in the past and this also caused weight gain. Will check labs - samples of Ryblesus given - will advise on medication when labs return. - Basic metabolic panel - Hemoglobin A1c - Microalbumin / creatinine urine ratio  2. Essential hypertension BP elevated today due to anxiety/depression Continue current regimen and recheck next visit  3. Mixed hyperlipidemia Will need to start statin but will discuss next visit  4. Moderate episode of recurrent major depressive disorder ([place]) Not doing well on cymbalta 60 mg daily. Will increase to 120 mg and re-evaluate in one month. - DULoxetine (CYMBALTA) 60 MG capsule; Take 2 capsules (120 mg total) by mouth daily.  Dispense: 180 capsule; Refill: 0  5. Gastroesophageal reflux disease without esophagitis Controlled on PRN Pepcid - famotidine (PEPCID) 20 MG tablet; Take 1 tablet (20 mg total) by mouth 2 (two) times daily.  Dispense: 180 tablet; Refill: 1   Partially dictated using Editor, commissioning. Any errors  are unintentional.  Halina Maidens, MD [place]  [facility]  07/06/2021

## 2021-07-08 LAB — BASIC METABOLIC PANEL
BUN/Creatinine Ratio: 21 (ref 12–28)
BUN: 13 mg/dL (ref 8–27)
CO2: 24 mmol/L (ref 20–29)
Calcium: 10.1 mg/dL (ref 8.7–10.3)
Chloride: 99 mmol/L (ref 96–106)
Creatinine, Ser: 0.62 mg/dL (ref 0.57–1.00)
Glucose: 124 mg/dL — ABNORMAL HIGH (ref 70–99)
Potassium: 4.6 mmol/L (ref 3.5–5.2)
Sodium: 140 mmol/L (ref 134–144)
eGFR: 99 mL/min/{1.73_m2} (ref >59)

## 2021-07-08 LAB — HEMOGLOBIN A1C
Est. average glucose Bld gHb Est-mCnc: 157 mg/dL
Hgb A1c MFr Bld: 7.1 % — ABNORMAL HIGH (ref 4.8–5.6)

## 2021-07-08 LAB — MICROALBUMIN / CREATININE URINE RATIO
Creatinine, Urine: 46 mg/dL
Microalb/Creat Ratio: <7 mg/g creat (ref 0–29)
Microalbumin, Urine: <3 ug/mL

## 2021-07-09 ENCOUNTER — Encounter: Payer: Self-pay | Admitting: Internal Medicine

## 2021-08-02 ENCOUNTER — Other Ambulatory Visit: Payer: Self-pay | Admitting: Internal Medicine

## 2021-08-02 DIAGNOSIS — F5101 Primary insomnia: Secondary | ICD-10-CM

## 2021-08-02 NOTE — Telephone Encounter (Signed)
Please advise 

## 2021-08-02 NOTE — Telephone Encounter (Signed)
Requested medication (s) are due for refill today - yes  Requested medication (s) are on the active medication list -yes  Future visit scheduled -yes  Last refill: 06/11/21 #30  Notes to clinic: non delegated Rx  Requested Prescriptions  Pending Prescriptions Disp Refills   zolpidem (AMBIEN) 10 MG tablet [Pharmacy Med Name: ZOLPIDEM TARTRATE 10 MG TABLET] 30 tablet 0    Sig: Take 1 tablet (10 mg total) by mouth at bedtime as needed for sleep.     Not Delegated - Psychiatry:  Anxiolytics/Hypnotics Failed - 08/02/2021 10:30 AM      Failed - This refill cannot be delegated      Failed - Urine Drug Screen completed in last 360 days      Passed - Valid encounter within last 6 months    Recent Outpatient Visits           3 weeks ago Type II diabetes mellitus with complication [facility])   [facility] Reubin Milan, MD   4 months ago Annual physical exam   [facility] Reubin Milan, MD   10 months ago Rash of entire body   [facility] Jerrol Banana, MD   10 months ago Type II diabetes mellitus with complication, uncontrolled [facility])   [facility] Reubin Milan, MD   1 year ago Type II diabetes mellitus with complication, uncontrolled [facility])   [facility] Reubin Milan, MD       Future Appointments             In 1 week Reubin Milan, MD [facility], [facility]               Requested Prescriptions  Pending Prescriptions Disp Refills   zolpidem (AMBIEN) 10 MG tablet [Pharmacy Med Name: ZOLPIDEM TARTRATE 10 MG TABLET] 30 tablet 0    Sig: Take 1 tablet (10 mg total) by mouth at bedtime as needed for sleep.     Not Delegated - Psychiatry:  Anxiolytics/Hypnotics Failed - 08/02/2021 10:30 AM      Failed - This refill cannot be delegated      Failed - Urine Drug Screen completed in last 360 days      Passed - Valid encounter within last 6 months    Recent Outpatient Visits           3 weeks ago Type II  diabetes mellitus with complication [facility])   [facility] Reubin Milan, MD   4 months ago Annual physical exam   [facility] Reubin Milan, MD   10 months ago Rash of entire body   [facility] Jerrol Banana, MD   10 months ago Type II diabetes mellitus with complication, uncontrolled [facility])   [facility] Reubin Milan, MD   1 year ago Type II diabetes mellitus with complication, uncontrolled [facility])   [facility] Reubin Milan, MD       Future Appointments             In 1 week Judithann Graves Nyoka Cowden, MD [facility], [facility]

## 2021-08-15 ENCOUNTER — Telehealth: Payer: Self-pay

## 2021-08-15 ENCOUNTER — Ambulatory Visit (INDEPENDENT_AMBULATORY_CARE_PROVIDER_SITE_OTHER): Payer: PPO | Admitting: Internal Medicine

## 2021-08-15 ENCOUNTER — Encounter: Payer: Self-pay | Admitting: Internal Medicine

## 2021-08-15 VITALS — BP 122/62 | HR 76 | Ht 64.5 in | Wt 201.6 lb

## 2021-08-15 DIAGNOSIS — F331 Major depressive disorder, recurrent, moderate: Secondary | ICD-10-CM

## 2021-08-15 DIAGNOSIS — E118 Type 2 diabetes mellitus with unspecified complications: Secondary | ICD-10-CM | POA: Diagnosis not present

## 2021-08-15 MED ORDER — RYBELSUS 7 MG PO TABS: 7.0000 mg | ORAL_TABLET | Freq: Every day | ORAL | 0 refills | Status: DC

## 2021-08-15 MED ORDER — FLUCONAZOLE 100 MG PO TABS: 100.0000 mg | ORAL_TABLET | Freq: Every day | ORAL | 0 refills | Status: DC

## 2021-08-15 NOTE — Telephone Encounter (Signed)
Completed PA on Rybelsus 7 mg on [email or web address].  (Key: [id number]) Rx #: [id number]

## 2021-08-15 NOTE — Progress Notes (Signed)
Date:  08/15/2021   Name:  [name]   DOB:  1955-09-21   MRN:  [id number]   Chief Complaint: Depression  Depression        This is a chronic problem.  The problem has been gradually improving since onset.  Associated symptoms include no fatigue, no appetite change and no headaches.  Past treatments include SNRIs - Serotonin and norepinephrine reuptake inhibitors (dose of Cymbalta increased last visit to 60 mg bid).  Compliance with treatment is good.  Previous treatment provided significant relief. Diabetes She presents for her follow-up diabetic visit. She has type 2 diabetes mellitus. Her disease course has been stable. Hypoglycemia symptoms include nervousness/anxiousness. Pertinent negatives for hypoglycemia include no dizziness or headaches. Pertinent negatives for diabetes include no chest pain, no fatigue and no weakness. Current diabetic treatments: farxiga and started Rybelsus. She is compliant with treatment all of the time. There is no change in her home blood glucose trend.  Only on Rybelsus 3 mg.  She is concerned about cost.  She would be willing to try Ozempic if it was more affordable.  Lab Results  Component Value Date   NA 140 07/06/2021   K 4.6 07/06/2021   CO2 24 07/06/2021   GLUCOSE 124 (H) 07/06/2021   BUN 13 07/06/2021   CREATININE 0.62 07/06/2021   CALCIUM 10.1 07/06/2021   EGFR 99 07/06/2021   GFRNONAA >60 10/11/2020   Lab Results  Component Value Date   CHOL 241 (H) 03/07/2021   HDL 50 03/07/2021   LDLCALC 163 (H) 03/07/2021   LDLDIRECT 161.1 11/08/2010   TRIG 155 (H) 03/07/2021   CHOLHDL 4.8 (H) 03/07/2021   Lab Results  Component Value Date   TSH 1.600 03/07/2021   Lab Results  Component Value Date   HGBA1C 7.1 (H) 07/06/2021   Lab Results  Component Value Date   WBC 6.7 03/07/2021   HGB 13.8 03/07/2021   HCT 41.6 03/07/2021   MCV 90 03/07/2021   PLT 298 03/07/2021   Lab Results  Component Value Date   ALT 21 03/07/2021    AST 24 03/07/2021   ALKPHOS 143 (H) 03/07/2021   BILITOT 0.5 03/07/2021   Lab Results  Component Value Date   VD25OH 21.5 (L) 02/09/2020     Review of Systems  Constitutional:  Negative for appetite change, fatigue and unexpected weight change.  HENT:  Negative for nosebleeds.   Eyes:  Negative for visual disturbance.  Respiratory:  Negative for cough, chest tightness, shortness of breath and wheezing.   Cardiovascular:  Negative for chest pain, palpitations and leg swelling.  Gastrointestinal:  Negative for abdominal pain, constipation, diarrhea and nausea.  Neurological:  Negative for dizziness, weakness, light-headedness and headaches.  Psychiatric/Behavioral:  Positive for depression and dysphoric mood (much improved). Negative for sleep disturbance. The patient is nervous/anxious.     Patient Active Problem List   Diagnosis Date Noted   Rash of entire body 09/28/2020   S/P hysterectomy 02/09/2020   Moderate episode of recurrent major depressive disorder ([place]) 02/09/2020   Neuropathy 07/30/2019   Osteopenia determined by x-ray 04/26/2019   Mixed hyperlipidemia 07/15/2017   Type II diabetes mellitus with complication ([place]) 45/03/8880   Vitamin D deficiency 04/18/2017   Sleep apnea 02/28/2015   Obesity 02/28/2015   Routine general medical examination at a health care facility 08/04/2013   Hemorrhoids 03/04/2012   Insomnia 03/04/2012   Arthralgia 03/04/2012   Migraine without aura or status migrainosus 03/04/2012  Arthralgia of right temporomandibular joint 03/04/2012   Essential hypertension 11/08/2010    Allergies  Allergen Reactions   Amlodipine Swelling    And headache    Past Surgical History:  Procedure Laterality Date   ABDOMINAL HYSTERECTOMY  2008   for Menorrhagia   BREAST REDUCTION SURGERY     COLONOSCOPY     LAPAROSCOPY     OPEN REDUCTION INTERNAL FIXATION (ORIF) DISTAL RADIAL FRACTURE Right 10/12/2020   Procedure: OPEN REDUCTION INTERNAL FIXATION  (ORIF) DISTAL RADIAL FRACTURE;  Surgeon: Hessie Knows, MD;  Location: [facility] ORS;  Service: Orthopedics;  Laterality: Right;   REDUCTION MAMMAPLASTY Bilateral 1991    Social History   Tobacco Use   Smoking status: Never   Smokeless tobacco: Never  Vaping Use   Vaping Use: Never used  Substance Use Topics   Alcohol use: Not Currently    Alcohol/week: 2.0 standard drinks of alcohol    Types: 2 Glasses of wine per week   Drug use: Never     Medication list has been reviewed and updated.  Current Meds  Medication Sig   cholecalciferol (VITAMIN D3) 25 MCG (1000 UNIT) tablet Take 2,000 Units by mouth daily.   diazepam (VALIUM) 5 MG tablet Take 1 tablet (5 mg total) by mouth every 6 (six) hours as needed for anxiety.   DULoxetine (CYMBALTA) 30 MG capsule Take 1 capsule (30 mg total) by mouth daily.   DULoxetine (CYMBALTA) 60 MG capsule Take 2 capsules (120 mg total) by mouth daily.   famotidine (PEPCID) 20 MG tablet Take 1 tablet (20 mg total) by mouth 2 (two) times daily.   glucose blood (ONETOUCH ULTRA) test strip Use to test blood sugar twice a day   losartan-hydrochlorothiazide (HYZAAR) 100-12.5 MG tablet Take 1 tablet by mouth daily.   nebivolol (BYSTOLIC) 5 MG tablet Take 1 tablet (5 mg total) by mouth daily.   Semaglutide (RYBELSUS) 3 MG TABS Take by mouth.   vitamin B-12 (CYANOCOBALAMIN) 1000 MCG tablet Take 1,000 mcg by mouth daily.   zolpidem (AMBIEN) 10 MG tablet Take 1 tablet (10 mg total) by mouth at bedtime as needed for sleep.       08/15/2021    2:47 PM 07/06/2021   10:29 AM 03/07/2021   10:37 AM 09/28/2020    4:35 PM  GAD 7 : Generalized Anxiety Score  Nervous, Anxious, on Edge 0 2 0 0  Control/stop worrying 1 1 0 0  Worry too much - different things 0 2 0 0  Trouble relaxing '1 2 1 ' 0  Restless 0 0 0 0  Easily annoyed or irritable '2 3 1 ' 0  Afraid - awful might happen 0 1 0 0  Total GAD 7 Score '4 11 2 ' 0  Anxiety Difficulty Somewhat difficult Very difficult Not  difficult at all Not difficult at all       08/15/2021    2:47 PM 07/06/2021   10:29 AM 03/07/2021   10:36 AM  Depression screen PHQ 2/9  Decreased Interest '2 3 1  ' Down, Depressed, Hopeless 3 3 0  PHQ - 2 Score '5 6 1  ' Altered sleeping '1 3 3  ' Tired, decreased energy '3 3 2  ' Change in appetite '1 3 2  ' Feeling bad or failure about yourself  1 3 0  Trouble concentrating '2 2 1  ' Moving slowly or fidgety/restless 0 2 0  Suicidal thoughts 0 0 0  PHQ-9 Score '13 22 9  ' Difficult doing work/chores Very difficult Very difficult  Somewhat difficult    BP Readings from Last 3 Encounters:  08/15/21 122/62  07/06/21 (!) 136/92  03/07/21 128/72    Physical Exam Vitals and nursing note reviewed.  Constitutional:      General: She is not in acute distress.    Appearance: She is well-developed.  HENT:     Head: Normocephalic and atraumatic.  Cardiovascular:     Rate and Rhythm: Normal rate and regular rhythm.  Pulmonary:     Effort: Pulmonary effort is normal. No respiratory distress.     Breath sounds: No wheezing or rhonchi.  Skin:    General: Skin is warm and dry.     Capillary Refill: Capillary refill takes less than 2 seconds.     Findings: No rash.  Neurological:     Mental Status: She is alert and oriented to person, place, and time.  Psychiatric:        Attention and Perception: Attention normal.        Mood and Affect: Mood normal.        Behavior: Behavior normal.     Wt Readings from Last 3 Encounters:  08/15/21 201 lb 9.6 oz (91.4 kg)  07/06/21 195 lb 12.8 oz (88.8 kg)  03/07/21 195 lb 3.2 oz (88.5 kg)    BP 122/62   Pulse 76   Ht 5' 4.5" (1.638 m)   Wt 201 lb 9.6 oz (91.4 kg)   SpO2 95%   BMI 34.07 kg/m   Assessment and Plan: 1. Type II diabetes mellitus with complication (HCC) Continue Farxiga; intolerance of metformin No side effects to Rybelsus 3 mg - will increase to 7 mg and do PA Samples of Rybelsus 3 mg - take 2 tabs daily until Rx approved. -  Semaglutide (RYBELSUS) 7 MG TABS; Take 7 mg by mouth daily.  Dispense: 30 tablet; Refill: 0  2. Moderate episode of recurrent major depressive disorder (HCC) Clinically improved on current regimen with good control of symptoms, No SI or HI. Will continue current therapy with Cymbalta 60 mg bid.   Partially dictated using Editor, commissioning. Any errors are unintentional.  Halina Maidens, MD [facility]  08/15/2021

## 2021-08-16 NOTE — Telephone Encounter (Signed)
Approved  08/15/21-01/27/22  KP  Pt informed and verbalized understanding.

## 2021-08-28 ENCOUNTER — Ambulatory Visit: Payer: Self-pay

## 2021-08-28 NOTE — Telephone Encounter (Signed)
Called pt tried to schedule an appt pt declined at this time. Told pt to drink plenty of fluids, get rest and alternate ibuprofen and tylenol for symptoms. Pt stated she feel better after calling and talking to someone from the [facility]. Told pt if she feel worse to call us back to be seen. Told pt if she has and SOB or wheezing to go to the ER. Pt verbalized understanding.  KP

## 2021-08-28 NOTE — Telephone Encounter (Signed)
Chief Complaint: dizziness Symptoms: BP elevated (155/100) rechecked during call (143/94), elevated BS 147.Covid exposure; weakness and headache Frequency: Saturday day of sx onset Pertinent Negatives: Patient denies chest pain, difficulty breathing, numbness or tingling to face arms or legs- stating is dropping items more frequently Disposition: [] ED /[] Urgent Care (no appt availability in office) / [] Appointment(In office/virtual)/ []  [facility]/ [] Home Care/ [] Refused Recommended Disposition /[] [facility]/ [x]  Follow-up with PCP Additional Notes: No available appts- will forward to office for review; advised pt to do covid test Reason for Disposition  [1] Systolic BP  >= 160 OR Diastolic >= 100 AND [2] cardiac (e.g., breathing difficulty, chest pain) or neurologic symptoms (e.g., new-onset blurred or double vision, unsteady gait)    Dizziness, mild vertigo  Answer Assessment - Initial Assessment Questions 1. BLOOD PRESSURE: "What is the blood pressure?" "Did you take at least two measurements 5 minutes apart?"     167/88    155/100 2. ONSET: "When did you take your blood pressure?"     This am and during call 3. HOW: "How did you take your blood pressure?" (e.g., automatic home BP monitor, visiting nurse)     Wrist BP monitor 4. HISTORY: "Do you have a history of high blood pressure?"     yes 5. MEDICINES: "Are you taking any medicines for blood pressure?" "Have you missed any doses recently?"     Yes- no 6. OTHER SYMPTOMS: "Do you have any symptoms?" (e.g., blurred vision, chest pain, difficulty breathing, headache, weakness)     Weakness, headache (mild)  Answer Assessment - Initial Assessment Questions 1. DESCRIPTION: "Describe your dizziness."     Head feels "weird" feels like in a daze feels fuzzy - droppingobjects several times 2. LIGHTHEADED: "Do you feel lightheaded?" (e.g., somewhat faint, woozy, weak upon standing)     yes 3. VERTIGO: "Do you feel  like either you or the room is spinning or tilting?" (i.e. vertigo)     no 4. SEVERITY: "How bad is it?"  "Do you feel like you are going to faint?" "Can you stand and walk?"   - MILD: Feels slightly dizzy, but walking normally.   - MODERATE: Feels unsteady when walking, but not falling; interferes with normal activities (e.g., school, work).   - SEVERE: Unable to walk without falling, or requires assistance to walk without falling; feels like passing out now.      mild 5. ONSET:  "When did the dizziness begin?"     Sunday 6. AGGRAVATING FACTORS: "Does anything make it worse?" (e.g., standing, change in head position)     no 7. HEART RATE: "Can you tell me your heart rate?" "How many beats in 15 seconds?"  (Note: not all patients can do this)       HR: 155/100 HR 75 8. CAUSE: "What do you think is causing the dizziness?"     Possible covid +, BP and BS higher 9. RECURRENT SYMPTOM: "Have you had dizziness before?" If Yes, ask: "When was the last time?" "What happened that time?"     vertigo 10. OTHER SYMPTOMS: "Do you have any other symptoms?" (e.g., fever, chest pain, vomiting, diarrhea, bleeding)       Runny nose, coughing , sneezing, sore throat, Higher BS and BP  Answer Assessment - Initial Assessment Questions 1. BLOOD PRESSURE: "What is your blood pressure?" "Did you take at least two measurements 5 minutes apart?"     16 7/88 this am; 155/100 during call 2. ONSET: "When did  you take your blood pressure?"     This am and during  3. HOW: "How did you take your blood pressure?" (e.g., automatic home BP monitor, visiting nurse)     wrist 4. HISTORY: "Do you have a history of high blood pressure?" "Were you diagnosed with preeclampsia during this or previous pregnancies?"     Yes  5. MEDICINES: "Are you taking any medicines for blood pressure?" "Have you missed any doses recently?"     Yes  6. PREGNANCY: "How many weeks pregnant are you?" "How many babies are your carrying?" (e.g.,  single baby, twins)     *No Answer* 7. EDD: "What date are you expecting to deliver?"     *No Answer* 8. OTHER SYMPTOMS: "Do you have any other symptoms?" (e.g., abdomen pain, chest pain, difficulty breathing, hand or face swelling, headache, vision changes)     *No Answer*  Protocols used: Dizziness - Lightheadedness-A-AH, Pregnancy - High Blood Pressure-A-AH, Blood Pressure - High-A-AH

## 2021-09-02 ENCOUNTER — Encounter: Payer: Self-pay | Admitting: Internal Medicine

## 2021-09-03 ENCOUNTER — Other Ambulatory Visit: Payer: Self-pay

## 2021-09-03 ENCOUNTER — Other Ambulatory Visit: Payer: Self-pay | Admitting: Internal Medicine

## 2021-09-03 DIAGNOSIS — I10 Essential (primary) hypertension: Secondary | ICD-10-CM

## 2021-09-03 DIAGNOSIS — E119 Type 2 diabetes mellitus without complications: Secondary | ICD-10-CM

## 2021-09-10 DIAGNOSIS — M65342 Trigger finger, left ring finger: Secondary | ICD-10-CM | POA: Diagnosis not present

## 2021-09-12 LAB — HM DIABETES EYE EXAM

## 2021-10-03 DIAGNOSIS — E119 Type 2 diabetes mellitus without complications: Secondary | ICD-10-CM | POA: Diagnosis not present

## 2021-10-03 DIAGNOSIS — M79672 Pain in left foot: Secondary | ICD-10-CM | POA: Diagnosis not present

## 2021-10-03 DIAGNOSIS — M19172 Post-traumatic osteoarthritis, left ankle and foot: Secondary | ICD-10-CM | POA: Diagnosis not present

## 2021-10-04 ENCOUNTER — Other Ambulatory Visit: Payer: Self-pay | Admitting: Internal Medicine

## 2021-10-04 ENCOUNTER — Other Ambulatory Visit: Payer: Self-pay

## 2021-10-04 DIAGNOSIS — F5101 Primary insomnia: Secondary | ICD-10-CM

## 2021-10-04 DIAGNOSIS — F331 Major depressive disorder, recurrent, moderate: Secondary | ICD-10-CM

## 2021-10-04 DIAGNOSIS — I10 Essential (primary) hypertension: Secondary | ICD-10-CM

## 2021-10-04 MED ORDER — DULOXETINE HCL 60 MG PO CPEP: 120.0000 mg | ORAL_CAPSULE | Freq: Every day | ORAL | 0 refills | Status: DC

## 2021-10-04 MED ORDER — NEBIVOLOL HCL 5 MG PO TABS: 5.0000 mg | ORAL_TABLET | Freq: Every day | ORAL | 1 refills | Status: DC

## 2021-10-10 DIAGNOSIS — M65342 Trigger finger, left ring finger: Secondary | ICD-10-CM | POA: Diagnosis not present

## 2021-10-12 ENCOUNTER — Encounter: Payer: Self-pay | Admitting: Internal Medicine

## 2021-10-16 ENCOUNTER — Encounter: Payer: Self-pay | Admitting: Family Medicine

## 2021-10-16 ENCOUNTER — Ambulatory Visit
Admission: RE | Admit: 2021-10-16 | Discharge: 2021-10-16 | Disposition: A | Discharge status: Home / Self Care | Payer: PPO | Source: Ambulatory Visit | Attending: Family Medicine | Admitting: Family Medicine

## 2021-10-16 ENCOUNTER — Ambulatory Visit: Payer: PPO | Admitting: Internal Medicine

## 2021-10-16 ENCOUNTER — Ambulatory Visit (INDEPENDENT_AMBULATORY_CARE_PROVIDER_SITE_OTHER): Payer: PPO | Admitting: Family Medicine

## 2021-10-16 ENCOUNTER — Ambulatory Visit
Admission: RE | Admit: 2021-10-16 | Discharge: 2021-10-16 | Disposition: A | Discharge status: Home / Self Care | Payer: PPO | Attending: Family Medicine | Admitting: Family Medicine

## 2021-10-16 VITALS — BP 124/84 | HR 72

## 2021-10-16 DIAGNOSIS — S63280A Dislocation of proximal interphalangeal joint of right index finger, initial encounter: Secondary | ICD-10-CM | POA: Diagnosis not present

## 2021-10-16 DIAGNOSIS — M79644 Pain in right finger(s): Secondary | ICD-10-CM | POA: Diagnosis not present

## 2021-10-16 DIAGNOSIS — S63289A Dislocation of proximal interphalangeal joint of unspecified finger, initial encounter: Secondary | ICD-10-CM | POA: Insufficient documentation

## 2021-10-16 NOTE — Patient Instructions (Addendum)
-   Obtain x-rays - Continue splint usage x1 week - Utilize ice, Tylenol for symptom control - Perform simple activity as tolerated - We will contact you with x-ray results and next steps

## 2021-10-16 NOTE — Progress Notes (Signed)
     Primary Care / Sports Medicine Office Visit  Patient Information:  Patient ID: [name], female DOB: 07-17-1955 Age: 66 y.o. MRN: [id number]   [name] is a pleasant 66 y.o. female presenting with the following:  Chief Complaint  Patient presents with   Hand Pain    Right pointer finger, was walking dog and fell and finger popped    Vitals:   10/16/21 1054  BP: 124/84  Pulse: 72  SpO2: 98%   There were no vitals filed for this visit. There is no height or weight on file to calculate BMI.  No results found.   Independent interpretation of notes and tests performed by another provider:   None  Procedures performed:   Procedure:  Closed reduction of right second digit Skin prepped in a sterile fashion. Ethyl chloride for topical local analgesia.  Medication: 5 mL lidocaine 1% without epinephrine utilized for digit nerve block Gentle traction with mobilization of MCP and PIP performed with appreciated reduction noted at the 2nd PIP Following procedure neurovascular status was confirmed, she was able to demonstrate active ROM throughout the digit Volar AlumaFoam splint applied to second digit Completed without difficulty and tolerated well. Advised to contact for fevers/chills, erythema, induration, drainage, or persistent bleeding.   Pertinent History, Exam, Impression, and Recommendations:   Problem List Items Addressed This Visit       Musculoskeletal and Integument   Closed dislocation finger, proximal interphalangeal joint, traumatic - Primary    Left-hand-dominant patient presenting with acute and traumatic right second digit dislocation that occurred earlier today.  She describes tripping over her daughter's dog, unknown certain on about exact mechanism of fall but feels that she had her hands/finger outstretched.  Had pain and deformation of her right second digit, inability to flex/extend.  She denies any paresthesias, no treatments as she  presented directly here after the injury.  Examination reveals deformation of the second digit of the right finger, minimal swelling, no ecchymosis, sensation is intact with focal pain about the second MCP and PIP, her finger is in a slightly flexed MCP position with hyperextension at the PIP, maintained alignment at the DIP.  No vascular compromise noted.  We discussed treatment strategies and did proceed with closed reduction after digital nerve block, placed into an AlumaFoam splint and was advised to proceed for x-rays to rule out osseous involvement.  If positive, pending involvement, continue/modify splinting and return for follow-up/new x-rays.  If no osseous involvement noted, splint x1 week followed by home-based exercises.      Relevant Orders   DG Hand Complete Right   DG Finger Index Right   Other Visit Diagnoses     Finger pain, right            Orders & Medications No orders of the defined types were placed in this encounter.  Orders Placed This Encounter  Procedures   DG Hand Complete Right   DG Finger Index Right     No follow-ups on file.     Montel Culver, MD   Primary Care Sports [facility] [place]

## 2021-10-16 NOTE — Assessment & Plan Note (Signed)
Left-hand-dominant patient presenting with acute and traumatic right second digit dislocation that occurred earlier today.  She describes tripping over her daughter's dog, unknown certain on about exact mechanism of fall but feels that she had her hands/finger outstretched.  Had pain and deformation of her right second digit, inability to flex/extend.  She denies any paresthesias, no treatments as she presented directly here after the injury.  Examination reveals deformation of the second digit of the right finger, minimal swelling, no ecchymosis, sensation is intact with focal pain about the second MCP and PIP, her finger is in a slightly flexed MCP position with hyperextension at the PIP, maintained alignment at the DIP.  No vascular compromise noted.  We discussed treatment strategies and did proceed with closed reduction after digital nerve block, placed into an AlumaFoam splint and was advised to proceed for x-rays to rule out osseous involvement.  If positive, pending involvement, continue/modify splinting and return for follow-up/new x-rays.  If no osseous involvement noted, splint x1 week followed by home-based exercises.

## 2021-10-26 ENCOUNTER — Ambulatory Visit (INDEPENDENT_AMBULATORY_CARE_PROVIDER_SITE_OTHER): Payer: PPO | Admitting: Internal Medicine

## 2021-10-26 ENCOUNTER — Encounter: Payer: Self-pay | Admitting: Internal Medicine

## 2021-10-26 VITALS — BP 104/78 | HR 77 | Ht 64.5 in | Wt 192.0 lb

## 2021-10-26 DIAGNOSIS — I10 Essential (primary) hypertension: Secondary | ICD-10-CM

## 2021-10-26 DIAGNOSIS — Z1231 Encounter for screening mammogram for malignant neoplasm of breast: Secondary | ICD-10-CM | POA: Diagnosis not present

## 2021-10-26 DIAGNOSIS — Z23 Encounter for immunization: Secondary | ICD-10-CM

## 2021-10-26 DIAGNOSIS — E118 Type 2 diabetes mellitus with unspecified complications: Secondary | ICD-10-CM

## 2021-10-26 DIAGNOSIS — E782 Mixed hyperlipidemia: Secondary | ICD-10-CM | POA: Diagnosis not present

## 2021-10-26 LAB — POCT GLYCOSYLATED HEMOGLOBIN (HGB A1C): Hemoglobin A1C: 6.9 % — AB (ref 4.0–5.6)

## 2021-10-26 MED ORDER — LOSARTAN POTASSIUM-HCTZ 100-12.5 MG PO TABS: 1.0000 | ORAL_TABLET | Freq: Every day | ORAL | 1 refills | Status: DC

## 2021-10-26 MED ORDER — DAPAGLIFLOZIN PROPANEDIOL 10 MG PO TABS: 10.0000 mg | ORAL_TABLET | Freq: Every day | ORAL | 1 refills | Status: DC

## 2021-10-26 NOTE — Progress Notes (Signed)
Date:  10/26/2021   Name:  [name]   DOB:  07/12/1955   MRN:  [id number]   Chief Complaint: Diabetes and Hypertension  Diabetes She presents for her follow-up diabetic visit. She has type 2 diabetes mellitus. Pertinent negatives for hypoglycemia include no headaches or tremors. Pertinent negatives for diabetes include no chest pain, no fatigue, no polydipsia and no polyuria. Symptoms are stable. Current diabetic treatment includes oral agent (monotherapy) (Farxiga; decided not to take Rybelsus after it was approved). She is following a generally healthy diet. Her home blood glucose trend is decreasing steadily. Her breakfast blood glucose is taken between 6-7 am. Her breakfast blood glucose range is generally 140-180 mg/dl. An ACE inhibitor/angiotensin II receptor blocker is being taken.    Lab Results  Component Value Date   NA 140 07/06/2021   K 4.6 07/06/2021   CO2 24 07/06/2021   GLUCOSE 124 (H) 07/06/2021   BUN 13 07/06/2021   CREATININE 0.62 07/06/2021   CALCIUM 10.1 07/06/2021   EGFR 99 07/06/2021   GFRNONAA >60 10/11/2020   Lab Results  Component Value Date   CHOL 241 (H) 03/07/2021   HDL 50 03/07/2021   LDLCALC 163 (H) 03/07/2021   LDLDIRECT 161.1 11/08/2010   TRIG 155 (H) 03/07/2021   CHOLHDL 4.8 (H) 03/07/2021   Lab Results  Component Value Date   TSH 1.600 03/07/2021   Lab Results  Component Value Date   HGBA1C 7.1 (H) 07/06/2021   Lab Results  Component Value Date   WBC 6.7 03/07/2021   HGB 13.8 03/07/2021   HCT 41.6 03/07/2021   MCV 90 03/07/2021   PLT 298 03/07/2021   Lab Results  Component Value Date   ALT 21 03/07/2021   AST 24 03/07/2021   ALKPHOS 143 (H) 03/07/2021   BILITOT 0.5 03/07/2021   Lab Results  Component Value Date   VD25OH 21.5 (L) 02/09/2020     Review of Systems  Constitutional:  Negative for appetite change, fatigue, fever and unexpected weight change.  HENT:  Negative for tinnitus and trouble swallowing.    Eyes:  Negative for visual disturbance.  Respiratory:  Negative for cough, chest tightness and shortness of breath.   Cardiovascular:  Negative for chest pain, palpitations and leg swelling.  Gastrointestinal:  Negative for abdominal pain.  Endocrine: Negative for polydipsia and polyuria.  Genitourinary:  Negative for dysuria and hematuria.  Musculoskeletal:  Negative for arthralgias.  Neurological:  Negative for tremors, numbness and headaches.  Psychiatric/Behavioral:  Negative for dysphoric mood.     Patient Active Problem List   Diagnosis Date Noted   Closed dislocation finger, proximal interphalangeal joint, traumatic 10/16/2021   Rash of entire body 09/28/2020   S/P hysterectomy 02/09/2020   Moderate episode of recurrent major depressive disorder ([place]) 02/09/2020   Neuropathy 07/30/2019   Osteopenia determined by x-ray 04/26/2019   Mixed hyperlipidemia 07/15/2017   Type II diabetes mellitus with complication ([place]) 68/11/5724   Vitamin D deficiency 04/18/2017   Sleep apnea 02/28/2015   Obesity 02/28/2015   Routine general medical examination at a health care facility 08/04/2013   Hemorrhoids 03/04/2012   Insomnia 03/04/2012   Arthralgia 03/04/2012   Migraine without aura or status migrainosus 03/04/2012   Arthralgia of right temporomandibular joint 03/04/2012   Essential hypertension 11/08/2010    Allergies  Allergen Reactions   Amlodipine Swelling    And headache    Past Surgical History:  Procedure Laterality Date   ABDOMINAL HYSTERECTOMY  2008   for Menorrhagia   BREAST REDUCTION SURGERY     COLONOSCOPY     LAPAROSCOPY     OPEN REDUCTION INTERNAL FIXATION (ORIF) DISTAL RADIAL FRACTURE Right 10/12/2020   Procedure: OPEN REDUCTION INTERNAL FIXATION (ORIF) DISTAL RADIAL FRACTURE;  Surgeon: Hessie Knows, MD;  Location: [facility] ORS;  Service: Orthopedics;  Laterality: Right;   REDUCTION MAMMAPLASTY Bilateral 1991    Social History   Tobacco Use   Smoking  status: Never   Smokeless tobacco: Never  Vaping Use   Vaping Use: Never used  Substance Use Topics   Alcohol use: Not Currently    Alcohol/week: 2.0 standard drinks of alcohol    Types: 2 Glasses of wine per week   Drug use: Never     Medication list has been reviewed and updated.  Current Meds  Medication Sig   cholecalciferol (VITAMIN D3) 25 MCG (1000 UNIT) tablet Take 2,000 Units by mouth daily.   dapagliflozin propanediol (FARXIGA) 10 MG TABS tablet Take 1 tablet (10 mg total) by mouth daily before breakfast.   diazepam (VALIUM) 5 MG tablet Take 1 tablet (5 mg total) by mouth every 6 (six) hours as needed for anxiety.   DULoxetine (CYMBALTA) 60 MG capsule Take 2 capsules (120 mg total) by mouth daily.   famotidine (PEPCID) 20 MG tablet Take 1 tablet (20 mg total) by mouth 2 (two) times daily.   glucose blood (ONETOUCH ULTRA) test strip Use to test blood sugar twice a day   losartan-hydrochlorothiazide (HYZAAR) 100-12.5 MG tablet Take 1 tablet by mouth daily.   nebivolol (BYSTOLIC) 5 MG tablet Take 1 tablet (5 mg total) by mouth daily.   vitamin B-12 (CYANOCOBALAMIN) 1000 MCG tablet Take 1,000 mcg by mouth daily.   zolpidem (AMBIEN) 10 MG tablet Take 1 tablet (10 mg total) by mouth at bedtime as needed for sleep.       08/15/2021    2:47 PM 07/06/2021   10:29 AM 03/07/2021   10:37 AM 09/28/2020    4:35 PM  GAD 7 : Generalized Anxiety Score  Nervous, Anxious, on Edge 0 2 0 0  Control/stop worrying 1 1 0 0  Worry too much - different things 0 2 0 0  Trouble relaxing _0 0  Restless 0 0 0 0  Easily annoyed or irritable _1 0  Afraid - awful might happen 0 1 0 0  Total GAD 7 Score _2 0  Anxiety Difficulty Somewhat difficult Very difficult Not difficult at all Not difficult at all       08/15/2021    2:47 PM 07/06/2021   10:29 AM 03/07/2021   10:36 AM  Depression screen PHQ 2/9  Decreased Interest _3 Down, Depressed, Hopeless 3 3 0  PHQ - 2 Score _4 Altered  sleeping _5 Tired, decreased energy _6 Change in appetite _7 Feeling bad or failure about yourself  1 3 0  Trouble concentrating _8 Moving slowly or fidgety/restless 0 2 0  Suicidal thoughts 0 0 0  PHQ-9 Score _9 Difficult doing work/chores Very difficult Very difficult Somewhat difficult    BP Readings from Last 3 Encounters:  10/26/21 104/78  10/16/21 124/84  08/15/21 122/62    Physical Exam Vitals and nursing note reviewed.  Constitutional:      General: She is not in acute distress.    Appearance:  She is well-developed.  HENT:     Head: Normocephalic and atraumatic.  Neck:     Vascular: No carotid bruit.  Cardiovascular:     Rate and Rhythm: Normal rate and regular rhythm.     Pulses: Normal pulses.  Pulmonary:     Effort: Pulmonary effort is normal. No respiratory distress.     Breath sounds: No wheezing or rhonchi.  Musculoskeletal:     Cervical back: Normal range of motion.     Right lower leg: No edema.     Left lower leg: No edema.  Lymphadenopathy:     Cervical: No cervical adenopathy.  Skin:    General: Skin is warm and dry.     Capillary Refill: Capillary refill takes less than 2 seconds.     Findings: No rash.  Neurological:     General: No focal deficit present.     Mental Status: She is alert and oriented to person, place, and time.  Psychiatric:        Mood and Affect: Mood normal.        Behavior: Behavior normal.     Wt Readings from Last 3 Encounters:  10/26/21 192 lb (87.1 kg)  08/15/21 201 lb 9.6 oz (91.4 kg)  07/06/21 195 lb 12.8 oz (88.8 kg)    BP 104/78   Pulse 77   Ht 5' 4.5" (1.638 m)   Wt 192 lb (87.1 kg)   SpO2 96%   BMI 32.45 kg/m   Assessment and Plan: 1. Type II diabetes mellitus with complication (HCC) Clinically stable by exam and report without s/s of hypoglycemia. DM complicated by hypertension and dyslipidemia. Tolerating medications well without side effects or other concerns. - POCT  glycosylated hemoglobin (Hb A1C)= 6.9 - dapagliflozin propanediol (FARXIGA) 10 MG TABS tablet; Take 1 tablet (10 mg total) by mouth daily before breakfast.  Dispense: 90 tablet; Refill: 1  2. Mixed hyperlipidemia Still resistant to statins - will discuss next visit after repeat lipid panel  3. Essential hypertension Clinically stable exam with well controlled BP. Tolerating medications without side effects at this time. Pt to continue current regimen and low sodium diet; benefits of regular exercise as able discussed. - losartan-hydrochlorothiazide (HYZAAR) 100-12.5 MG tablet; Take 1 tablet by mouth daily.  Dispense: 90 tablet; Refill: 1  4. Encounter for screening mammogram for breast cancer Schedule at [facility] - MM 3D SCREEN BREAST BILATERAL   Partially dictated using Editor, commissioning. Any errors are unintentional.  Halina Maidens, MD [facility]  10/26/2021

## 2021-11-08 DIAGNOSIS — D649 Anemia, unspecified: Secondary | ICD-10-CM | POA: Insufficient documentation

## 2021-11-13 ENCOUNTER — Other Ambulatory Visit: Payer: Self-pay | Admitting: Internal Medicine

## 2021-11-13 ENCOUNTER — Encounter: Payer: Self-pay | Admitting: Internal Medicine

## 2021-11-13 DIAGNOSIS — B3731 Acute candidiasis of vulva and vagina: Secondary | ICD-10-CM

## 2021-11-13 MED ORDER — FLUCONAZOLE 100 MG PO TABS: 100.0000 mg | ORAL_TABLET | Freq: Every day | ORAL | 0 refills | Status: DC

## 2021-11-15 ENCOUNTER — Telehealth: Payer: Self-pay

## 2021-11-15 NOTE — Telephone Encounter (Signed)
Spoke with patient on the phone and reminded her to schedule her mammogram as soon as she is able. She verbalized understanding.  - Hollyanne Schloesser

## 2021-12-14 ENCOUNTER — Encounter: Payer: Self-pay | Admitting: Internal Medicine

## 2022-01-01 ENCOUNTER — Other Ambulatory Visit: Payer: Self-pay | Admitting: Internal Medicine

## 2022-01-01 ENCOUNTER — Other Ambulatory Visit: Payer: Self-pay

## 2022-01-01 ENCOUNTER — Encounter: Payer: Self-pay | Admitting: Internal Medicine

## 2022-01-01 DIAGNOSIS — E118 Type 2 diabetes mellitus with unspecified complications: Secondary | ICD-10-CM

## 2022-01-01 DIAGNOSIS — F331 Major depressive disorder, recurrent, moderate: Secondary | ICD-10-CM

## 2022-01-01 DIAGNOSIS — F5101 Primary insomnia: Secondary | ICD-10-CM

## 2022-01-01 MED ORDER — DULOXETINE HCL 60 MG PO CPEP: 120.0000 mg | ORAL_CAPSULE | Freq: Every day | ORAL | 0 refills | Status: DC

## 2022-01-01 MED ORDER — DAPAGLIFLOZIN PROPANEDIOL 10 MG PO TABS: 10.0000 mg | ORAL_TABLET | Freq: Every day | ORAL | 0 refills | Status: DC

## 2022-01-01 MED ORDER — DULOXETINE HCL 60 MG PO CPEP: 180.0000 mg | ORAL_CAPSULE | Freq: Every day | ORAL | 0 refills | Status: DC

## 2022-02-06 ENCOUNTER — Other Ambulatory Visit: Payer: Self-pay | Admitting: Internal Medicine

## 2022-02-06 ENCOUNTER — Encounter: Payer: Self-pay | Admitting: Internal Medicine

## 2022-02-06 MED ORDER — FLUCONAZOLE 100 MG PO TABS: 100.0000 mg | ORAL_TABLET | Freq: Every day | ORAL | 0 refills | Status: AC

## 2022-03-13 ENCOUNTER — Encounter: Payer: PPO | Admitting: Internal Medicine

## 2022-04-03 ENCOUNTER — Ambulatory Visit (INDEPENDENT_AMBULATORY_CARE_PROVIDER_SITE_OTHER): Payer: PPO | Admitting: Physician Assistant

## 2022-04-03 ENCOUNTER — Encounter: Payer: Self-pay | Admitting: Physician Assistant

## 2022-04-03 VITALS — BP 122/70 | HR 74 | Temp 98.2°F | Ht 64.5 in | Wt 195.0 lb

## 2022-04-03 DIAGNOSIS — J069 Acute upper respiratory infection, unspecified: Secondary | ICD-10-CM | POA: Diagnosis not present

## 2022-04-03 LAB — POC COVID19 BINAXNOW: SARS Coronavirus 2 Ag: NEGATIVE

## 2022-04-03 MED ORDER — PROMETHAZINE-DM 6.25-15 MG/5ML PO SYRP: 5.0000 mL | ORAL_SOLUTION | Freq: Four times a day (QID) | ORAL | 0 refills | Status: AC | PRN

## 2022-04-03 MED ORDER — BENZONATATE 200 MG PO CAPS: 200.0000 mg | ORAL_CAPSULE | Freq: Two times a day (BID) | ORAL | 0 refills | Status: DC | PRN

## 2022-04-03 NOTE — Progress Notes (Signed)
Date:  04/03/2022   Name:  OMARA GILHOOLEY   DOB:  [birth year]   MRN:  [id number]   Chief Complaint: No chief complaint on file.  Cough This is a new problem. Episode onset: X 2 days. The problem has been gradually worsening. The problem occurs every few minutes. The cough is Non-productive. Associated symptoms include ear congestion, headaches, nasal congestion, postnasal drip and rhinorrhea. Pertinent negatives include no shortness of breath or wheezing. Associated symptoms comments: Mucous from nose yellow, fatigue . She has tried OTC cough suppressant for the symptoms. The treatment provided no relief.   Nadin is a very pleasant 67 year old female new to me today for evaluation of acute URI symptoms as above for the past 2 days.  She states she had similar symptoms about 6 weeks ago but fully recovered, with exception of some persistent mild fatigue.  She is using DayQuil, NyQuil, Mucinex at home with mild relief of symptoms but still struggling with sleep.  Says in the past she has been prescribed guaifenesin syrup for cough, unspecified whether it was with or without codeine, which she describes as "that nasty stuff".  Overall, states "I do not feel horrible, but I just thought I should be checked out"  Lab Results  Component Value Date   NA 140 07/06/2021   K 4.6 07/06/2021   CO2 24 07/06/2021   GLUCOSE 124 (H) 07/06/2021   BUN 13 07/06/2021   CREATININE 0.62 07/06/2021   CALCIUM 10.1 07/06/2021   EGFR 99 07/06/2021   GFRNONAA >60 10/11/2020   Lab Results  Component Value Date   CHOL 241 (H) 03/07/2021   HDL 50 03/07/2021   LDLCALC 163 (H) 03/07/2021   LDLDIRECT 161.1 11/08/2010   TRIG 155 (H) 03/07/2021   CHOLHDL 4.8 (H) 03/07/2021   Lab Results  Component Value Date   TSH 1.600 03/07/2021   Lab Results  Component Value Date   HGBA1C 6.9 (A) 10/26/2021   Lab Results  Component Value Date   WBC 6.7 03/07/2021   HGB 13.8 03/07/2021   HCT 41.6 03/07/2021   MCV  90 03/07/2021   PLT 298 03/07/2021   Lab Results  Component Value Date   ALT 21 03/07/2021   AST 24 03/07/2021   ALKPHOS 143 (H) 03/07/2021   BILITOT 0.5 03/07/2021   Lab Results  Component Value Date   VD25OH 21.5 (L) 02/09/2020     Review of Systems  HENT:  Positive for postnasal drip and rhinorrhea.   Respiratory:  Positive for cough. Negative for chest tightness, shortness of breath and wheezing.   Neurological:  Positive for headaches.    Patient Active Problem List   Diagnosis Date Noted   S/P hysterectomy 02/09/2020   Moderate episode of recurrent major depressive disorder ([place]) 02/09/2020   Neuropathy 07/30/2019   Osteopenia determined by x-ray 04/26/2019   Mixed hyperlipidemia 07/15/2017   Type II diabetes mellitus with complication ([place]) [id number]   Vitamin D deficiency 04/18/2017   Sleep apnea 02/28/2015   Obesity 02/28/2015   Hemorrhoids 03/04/2012   Insomnia 03/04/2012   Arthralgia 03/04/2012   Migraine without aura or status migrainosus 03/04/2012   Arthralgia of right temporomandibular joint 03/04/2012   Essential hypertension 11/08/2010    Allergies  Allergen Reactions   Amlodipine Swelling    And headache    Past Surgical History:  Procedure Laterality Date   ABDOMINAL HYSTERECTOMY  2008   for Menorrhagia   BREAST REDUCTION SURGERY  COLONOSCOPY     LAPAROSCOPY     OPEN REDUCTION INTERNAL FIXATION (ORIF) DISTAL RADIAL FRACTURE Right 10/12/2020   Procedure: OPEN REDUCTION INTERNAL FIXATION (ORIF) DISTAL RADIAL FRACTURE;  Surgeon: Hessie Knows, MD;  Location: [facility] ORS;  Service: Orthopedics;  Laterality: Right;   REDUCTION MAMMAPLASTY Bilateral 1991    Social History   Tobacco Use   Smoking status: Never   Smokeless tobacco: Never  Vaping Use   Vaping Use: Never used  Substance Use Topics   Alcohol use: Not Currently    Alcohol/week: 2.0 standard drinks of alcohol    Types: 2 Glasses of wine per week   Drug use: Never      Medication list has been reviewed and updated.  No outpatient medications have been marked as taking for the 04/03/22 encounter (Appointment) with Jeannene Patella, PA.       10/26/2021    2:05 PM 08/15/2021    2:47 PM 07/06/2021   10:29 AM 03/07/2021   10:37 AM  GAD 7 : Generalized Anxiety Score  Nervous, Anxious, on Edge 2 0 2 0  Control/stop worrying '1 1 1 '$ 0  Worry too much - different things 1 0 2 0  Trouble relaxing '1 1 2 1  '$ Restless 1 0 0 0  Easily annoyed or irritable 0 '2 3 1  '$ Afraid - awful might happen 0 0 1 0  Total GAD 7 Score '6 4 11 2  '$ Anxiety Difficulty Somewhat difficult Somewhat difficult Very difficult Not difficult at all       10/26/2021    2:05 PM 08/15/2021    2:47 PM 07/06/2021   10:29 AM  Depression screen PHQ 2/9  Decreased Interest 0 2 3  Down, Depressed, Hopeless '1 3 3  '$ PHQ - 2 Score '1 5 6  '$ Altered sleeping '3 1 3  '$ Tired, decreased energy '2 3 3  '$ Change in appetite '2 1 3  '$ Feeling bad or failure about yourself  '1 1 3  '$ Trouble concentrating '1 2 2  '$ Moving slowly or fidgety/restless 0 0 2  Suicidal thoughts 0 0 0  PHQ-9 Score '10 13 22  '$ Difficult doing work/chores Somewhat difficult Very difficult Very difficult    BP Readings from Last 3 Encounters:  10/26/21 104/78  10/16/21 124/84  08/15/21 122/62    Physical Exam Constitutional:      Appearance: Normal appearance.  HENT:     Right Ear: Tympanic membrane normal.     Left Ear: Tympanic membrane normal.     Nose: Nose normal.     Mouth/Throat:     Mouth: Mucous membranes are moist.     Pharynx: No posterior oropharyngeal erythema.  Cardiovascular:     Rate and Rhythm: Normal rate and regular rhythm.  Pulmonary:     Effort: Pulmonary effort is normal.     Breath sounds: Normal breath sounds.     Wt Readings from Last 3 Encounters:  10/26/21 192 lb (87.1 kg)  08/15/21 201 lb 9.6 oz (91.4 kg)  07/06/21 195 lb 12.8 oz (88.8 kg)    There were no vitals taken for this  visit.  Assessment and Plan:  Problem List Items Addressed This Visit   None Visit Diagnoses     Acute URI    -  Primary   Negative COVID test today.  Will treat symptomatically   Relevant Medications   benzonatate (TESSALON) 200 MG capsule   promethazine-dextromethorphan (PROMETHAZINE-DM) 6.25-15 MG/5ML syrup   Other Relevant Orders  POC COVID-19 (Completed)       Likely viral etiology. Discussed self-limited nature of viral illnesses and advised conservative measures including rest, fluids, and OTC cough/cold medications. Contact precautions advised to limit spread. Encouraged mask wearing and good hand hygiene especially before meals. Call if acutely worsening symptoms or if no improvement in 5 days  Partially dictated using Editor, commissioning. Any errors are unintentional.  Lupita Leash, Hershal Coria, [facility]  04/03/2022

## 2022-04-06 ENCOUNTER — Encounter: Payer: Self-pay | Admitting: Internal Medicine

## 2022-04-08 ENCOUNTER — Telehealth: Payer: Self-pay | Admitting: Internal Medicine

## 2022-04-08 ENCOUNTER — Encounter: Payer: Self-pay | Admitting: Internal Medicine

## 2022-04-08 ENCOUNTER — Ambulatory Visit (INDEPENDENT_AMBULATORY_CARE_PROVIDER_SITE_OTHER): Payer: PPO | Admitting: Internal Medicine

## 2022-04-08 VITALS — BP 175/96 | HR 89 | Ht 64.5 in | Wt 194.0 lb

## 2022-04-08 DIAGNOSIS — B3731 Acute candidiasis of vulva and vagina: Secondary | ICD-10-CM | POA: Diagnosis not present

## 2022-04-08 DIAGNOSIS — Z7984 Long term (current) use of oral hypoglycemic drugs: Secondary | ICD-10-CM | POA: Diagnosis not present

## 2022-04-08 DIAGNOSIS — I10 Essential (primary) hypertension: Secondary | ICD-10-CM | POA: Diagnosis not present

## 2022-04-08 DIAGNOSIS — F331 Major depressive disorder, recurrent, moderate: Secondary | ICD-10-CM

## 2022-04-08 DIAGNOSIS — J069 Acute upper respiratory infection, unspecified: Secondary | ICD-10-CM | POA: Diagnosis not present

## 2022-04-08 DIAGNOSIS — E118 Type 2 diabetes mellitus with unspecified complications: Secondary | ICD-10-CM

## 2022-04-08 MED ORDER — CLINDAMYCIN HCL 300 MG PO CAPS: 300.0000 mg | ORAL_CAPSULE | Freq: Three times a day (TID) | ORAL | 0 refills | Status: DC

## 2022-04-08 MED ORDER — FLUCONAZOLE 100 MG PO TABS: 100.0000 mg | ORAL_TABLET | Freq: Every day | ORAL | 0 refills | Status: AC

## 2022-04-08 NOTE — Telephone Encounter (Signed)
Left voice mail for follow up with dr berglund for depression and DM.

## 2022-04-08 NOTE — Assessment & Plan Note (Signed)
BS well controlled recently on Farxiga but with recurrent yeast infections Will continue Farxiga for now; treat yeast infection Consider alternative medications such as Januvia, Amaryl etc at next visit

## 2022-04-08 NOTE — Assessment & Plan Note (Signed)
BP very high today but patient is also very tearful BP improved some after sitting. Will continue current medications - losartan/hct and bystolic until next visit Consider stopping ARB if cough persists

## 2022-04-08 NOTE — Assessment & Plan Note (Addendum)
Depressive symptoms much worse with prolonged URI She is not actively suicidal. Will continue current medications and re-evaluate in 10 days after treatment

## 2022-04-08 NOTE — Progress Notes (Signed)
Date:  04/08/2022   Name:  [name]   DOB:  [birth year]   MRN:  [id number]   Chief Complaint: Diabetes, Hypertension, and Depression  Diabetes She presents for her follow-up diabetic visit. She has type 2 diabetes mellitus. Her disease course has been worsening. Hypoglycemia symptoms include headaches and nervousness/anxiousness. Pertinent negatives for hypoglycemia include no dizziness. Associated symptoms include fatigue. Pertinent negatives for diabetes include no chest pain. Current diabetic treatment includes oral agent (monotherapy) (farxiga causes recurrent yeast infections). She is compliant with treatment all of the time.  Hypertension This is a chronic problem. Progression since onset: very high this week. Associated symptoms include headaches and shortness of breath. Pertinent negatives include no chest pain.  Depression        This is a chronic problem.  The problem has been gradually worsening since onset.  Associated symptoms include fatigue and headaches.  Associated symptoms include no suicidal ideas. Cough This is a chronic problem. The current episode started more than 1 month ago. The problem has been unchanged. The problem occurs every few minutes. The cough is Productive of sputum (mostly clear). Associated symptoms include headaches, nasal congestion and shortness of breath. Pertinent negatives include no chest pain, chills, fever or wheezing. She has tried OTC cough suppressant and prescription cough suppressant for the symptoms. The treatment provided no relief.    Lab Results  Component Value Date   NA 140 07/06/2021   K 4.6 07/06/2021   CO2 24 07/06/2021   GLUCOSE 124 (H) 07/06/2021   BUN 13 07/06/2021   CREATININE 0.62 07/06/2021   CALCIUM 10.1 07/06/2021   EGFR 99 07/06/2021   GFRNONAA >60 10/11/2020   Lab Results  Component Value Date   CHOL 241 (H) 03/07/2021   HDL 50 03/07/2021   LDLCALC 163 (H) 03/07/2021   LDLDIRECT 161.1 11/08/2010    TRIG 155 (H) 03/07/2021   CHOLHDL 4.8 (H) 03/07/2021   Lab Results  Component Value Date   TSH 1.600 03/07/2021   Lab Results  Component Value Date   HGBA1C 6.9 (A) 10/26/2021   Lab Results  Component Value Date   WBC 6.7 03/07/2021   HGB 13.8 03/07/2021   HCT 41.6 03/07/2021   MCV 90 03/07/2021   PLT 298 03/07/2021   Lab Results  Component Value Date   ALT 21 03/07/2021   AST 24 03/07/2021   ALKPHOS 143 (H) 03/07/2021   BILITOT 0.5 03/07/2021   Lab Results  Component Value Date   VD25OH 21.5 (L) 02/09/2020     Review of Systems  Constitutional:  Positive for fatigue. Negative for chills, fever and unexpected weight change.  HENT:  Positive for dental problem and sinus pressure.   Respiratory:  Positive for cough and shortness of breath. Negative for chest tightness and wheezing.   Cardiovascular:  Negative for chest pain and leg swelling.  Gastrointestinal:  Negative for constipation and diarrhea.  Neurological:  Positive for headaches. Negative for dizziness and numbness.  Psychiatric/Behavioral:  Positive for depression, dysphoric mood and sleep disturbance. Negative for self-injury and suicidal ideas. The patient is nervous/anxious.     Patient Active Problem List   Diagnosis Date Noted   S/P hysterectomy 02/09/2020   Moderate episode of recurrent major depressive disorder ([place]) 02/09/2020   Neuropathy 07/30/2019   Osteopenia determined by x-ray 04/26/2019   Mixed hyperlipidemia 07/15/2017   Type II diabetes mellitus with complication ([place]) XX123456   Vitamin D deficiency 04/18/2017   Sleep apnea 02/28/2015  Obesity 02/28/2015   Hemorrhoids 03/04/2012   Insomnia 03/04/2012   Arthralgia 03/04/2012   Migraine without aura or status migrainosus 03/04/2012   Arthralgia of right temporomandibular joint 03/04/2012   Essential hypertension 11/08/2010    Allergies  Allergen Reactions   Amlodipine Swelling    And headache    Past Surgical History:   Procedure Laterality Date   ABDOMINAL HYSTERECTOMY  2008   for Menorrhagia   BREAST REDUCTION SURGERY     COLONOSCOPY     LAPAROSCOPY     OPEN REDUCTION INTERNAL FIXATION (ORIF) DISTAL RADIAL FRACTURE Right 10/12/2020   Procedure: OPEN REDUCTION INTERNAL FIXATION (ORIF) DISTAL RADIAL FRACTURE;  Surgeon: Hessie Knows, MD;  Location: [facility] ORS;  Service: Orthopedics;  Laterality: Right;   REDUCTION MAMMAPLASTY Bilateral 1991    Social History   Tobacco Use   Smoking status: Never   Smokeless tobacco: Never  Vaping Use   Vaping Use: Never used  Substance Use Topics   Alcohol use: Not Currently    Alcohol/week: 2.0 standard drinks of alcohol    Types: 2 Glasses of wine per week   Drug use: Never     Medication list has been reviewed and updated.  Current Meds  Medication Sig   Ascorbic Acid (VITA-C PO) Take by mouth.   cholecalciferol (VITAMIN D3) 25 MCG (1000 UNIT) tablet Take 2,000 Units by mouth daily.   CINNAMON PO Take by mouth.   clindamycin (CLEOCIN) 300 MG capsule Take 1 capsule (300 mg total) by mouth 3 (three) times daily for 10 days.   dapagliflozin propanediol (FARXIGA) 10 MG TABS tablet Take 1 tablet (10 mg total) by mouth daily before breakfast.   diazepam (VALIUM) 5 MG tablet Take 1 tablet (5 mg total) by mouth every 6 (six) hours as needed for anxiety.   DULoxetine (CYMBALTA) 60 MG capsule Take 2 capsules (120 mg total) by mouth daily.   ferrous sulfate 324 MG TBEC Take 324 mg by mouth daily with breakfast.   glucose blood (ONETOUCH ULTRA) test strip Use to test blood sugar twice a day   losartan-hydrochlorothiazide (HYZAAR) 100-12.5 MG tablet Take 1 tablet by mouth daily.   Multiple Vitamins-Minerals (ZINC PO) Take by mouth.   nebivolol (BYSTOLIC) 5 MG tablet Take 1 tablet (5 mg total) by mouth daily.   promethazine-dextromethorphan (PROMETHAZINE-DM) 6.25-15 MG/5ML syrup Take 5 mLs by mouth 4 (four) times daily as needed for up to 9 days for cough.   vitamin  B-12 (CYANOCOBALAMIN) 1000 MCG tablet Take 1,000 mcg by mouth daily.   zolpidem (AMBIEN) 10 MG tablet Take 1 tablet (10 mg total) by mouth at bedtime as needed for sleep.   [DISCONTINUED] benzonatate (TESSALON) 200 MG capsule Take 1 capsule (200 mg total) by mouth 2 (two) times daily as needed for cough.       04/08/2022   10:27 AM 10/26/2021    2:05 PM 08/15/2021    2:47 PM 07/06/2021   10:29 AM  GAD 7 : Generalized Anxiety Score  Nervous, Anxious, on Edge 2 2 0 2  Control/stop worrying '2 1 1 1  '$ Worry too much - different things 2 1 0 2  Trouble relaxing '2 1 1 2  '$ Restless 0 1 0 0  Easily annoyed or irritable 2 0 2 3  Afraid - awful might happen 1 0 0 1  Total GAD 7 Score '11 6 4 11  '$ Anxiety Difficulty Extremely difficult Somewhat difficult Somewhat difficult Very difficult  04/08/2022   10:26 AM 10/26/2021    2:05 PM 08/15/2021    2:47 PM  Depression screen PHQ 2/9  Decreased Interest 3 0 2  Down, Depressed, Hopeless '3 1 3  '$ PHQ - 2 Score '6 1 5  '$ Altered sleeping '3 3 1  '$ Tired, decreased energy '3 2 3  '$ Change in appetite '3 2 1  '$ Feeling bad or failure about yourself  '3 1 1  '$ Trouble concentrating '3 1 2  '$ Moving slowly or fidgety/restless 3 0 0  Suicidal thoughts 1 0 0  PHQ-9 Score '25 10 13  '$ Difficult doing work/chores Extremely dIfficult Somewhat difficult Very difficult    BP Readings from Last 3 Encounters:  04/08/22 (!) 175/96  04/03/22 122/70  10/26/21 104/78    Physical Exam Vitals and nursing note reviewed.  Constitutional:      General: She is not in acute distress.    Appearance: She is well-developed. She is ill-appearing.  HENT:     Head: Normocephalic and atraumatic.     Right Ear: Tympanic membrane is retracted. Tympanic membrane is not erythematous.     Left Ear: Tympanic membrane is retracted. Tympanic membrane is not erythematous.     Nose:     Right Sinus: Maxillary sinus tenderness present. No frontal sinus tenderness.     Left Sinus: Maxillary sinus  tenderness present. No frontal sinus tenderness.     Mouth/Throat:     Dentition: Abnormal dentition.  Cardiovascular:     Rate and Rhythm: Normal rate and regular rhythm.  Pulmonary:     Effort: Pulmonary effort is normal. No respiratory distress.     Breath sounds: No wheezing or rhonchi.  Musculoskeletal:     Cervical back: Normal range of motion.  Lymphadenopathy:     Cervical: No cervical adenopathy.  Skin:    General: Skin is warm and dry.     Findings: No rash.  Neurological:     Mental Status: She is alert and oriented to person, place, and time.  Psychiatric:        Attention and Perception: Attention normal.        Mood and Affect: Mood normal. Affect is tearful.        Speech: Speech normal.        Behavior: Behavior normal.        Thought Content: Thought content does not include suicidal ideation. Thought content does not include suicidal plan.        Cognition and Memory: Cognition normal.        Judgment: Judgment normal.     Wt Readings from Last 3 Encounters:  04/08/22 194 lb (88 kg)  04/03/22 195 lb (88.5 kg)  10/26/21 192 lb (87.1 kg)    BP (!) 175/96 (BP Location: Left Arm, Cuff Size: Large)   Pulse 89   Ht 5' 4.5" (1.638 m)   Wt 194 lb (88 kg)   SpO2 98%   BMI 32.79 kg/m   Assessment and Plan: Problem List Items Addressed This Visit       Cardiovascular and Mediastinum   Essential hypertension (Chronic)    BP very high today but patient is also very tearful BP improved some after sitting. Will continue current medications - losartan/hct and bystolic until next visit Consider stopping ARB if cough persists        Endocrine   Type II diabetes mellitus with complication (HCC) - Primary (Chronic)    BS well controlled recently on Farxiga but with recurrent yeast  infections Will continue Farxiga for now; treat yeast infection Consider alternative medications such as Januvia, Amaryl etc at next visit        Other   Moderate episode of  recurrent major depressive disorder ([place]) (Chronic)    Depressive symptoms much worse with prolonged URI She is not actively suicidal. Will continue current medications and re-evaluate in 10 days after treatment      Other Visit Diagnoses     Upper respiratory tract infection, unspecified type       with dental infection contributing follow up in 9 days   Relevant Medications   clindamycin (CLEOCIN) 300 MG capsule   fluconazole (DIFLUCAN) 100 MG tablet   Yeast vaginitis       Relevant Medications   clindamycin (CLEOCIN) 300 MG capsule   fluconazole (DIFLUCAN) 100 MG tablet        Partially dictated using Editor, [employer]. Any errors are unintentional.  Halina Maidens, MD [facility]  04/08/2022

## 2022-04-12 ENCOUNTER — Telehealth: Payer: Self-pay | Admitting: Internal Medicine

## 2022-04-12 NOTE — Telephone Encounter (Signed)
Elkin to schedule their annual wellness visit. Call back at later date: 04/15/22  Sherol Dade; [facility] Direct Dial: [phone]

## 2022-04-18 ENCOUNTER — Ambulatory Visit (INDEPENDENT_AMBULATORY_CARE_PROVIDER_SITE_OTHER): Payer: PPO | Admitting: Internal Medicine

## 2022-04-18 ENCOUNTER — Encounter: Payer: Self-pay | Admitting: Internal Medicine

## 2022-04-18 VITALS — BP 122/72 | HR 81 | Ht 64.5 in | Wt 193.4 lb

## 2022-04-18 DIAGNOSIS — F5101 Primary insomnia: Secondary | ICD-10-CM | POA: Diagnosis not present

## 2022-04-18 DIAGNOSIS — F331 Major depressive disorder, recurrent, moderate: Secondary | ICD-10-CM

## 2022-04-18 DIAGNOSIS — E118 Type 2 diabetes mellitus with unspecified complications: Secondary | ICD-10-CM

## 2022-04-18 DIAGNOSIS — E782 Mixed hyperlipidemia: Secondary | ICD-10-CM

## 2022-04-18 LAB — POCT GLYCOSYLATED HEMOGLOBIN (HGB A1C): Hemoglobin A1C: 7.8 % — AB (ref 4.0–5.6)

## 2022-04-18 MED ORDER — ZOLPIDEM TARTRATE 10 MG PO TABS: 10.0000 mg | ORAL_TABLET | Freq: Every evening | ORAL | 5 refills | Status: DC | PRN

## 2022-04-18 MED ORDER — DAPAGLIFLOZIN PROPANEDIOL 10 MG PO TABS: 10.0000 mg | ORAL_TABLET | Freq: Every day | ORAL | 3 refills | Status: DC

## 2022-04-18 MED ORDER — PRAVASTATIN SODIUM 10 MG PO TABS: 10.0000 mg | ORAL_TABLET | ORAL | 1 refills | Status: DC

## 2022-04-18 NOTE — Assessment & Plan Note (Addendum)
Elevated 10 yr risk along with DM and HTN Has been repeatedly resistant to statin therapy  Will try pravchol 10 mg biw

## 2022-04-18 NOTE — Assessment & Plan Note (Signed)
Doing well on Ambien without side effects or evidence of misuse Risk outweighs benefit at this time

## 2022-04-18 NOTE — Assessment & Plan Note (Addendum)
Clinically stable on current regimen with improved control of symptoms, without SI or HI. On Cymbalta daily - continue current therapy

## 2022-04-18 NOTE — Progress Notes (Signed)
Date:  04/18/2022   Name:  [name]   DOB:  Feb 04, 1955   MRN:  [id number]   Chief Complaint: Diabetes  Diabetes She presents for her follow-up diabetic visit. She has type 2 diabetes mellitus. Her disease course has been stable. Pertinent negatives for hypoglycemia include no headaches or tremors. Pertinent negatives for diabetes include no chest pain, no fatigue, no polydipsia and no polyuria. Current diabetic treatment includes oral agent (monotherapy). She is compliant with treatment all of the time.  Depression        This is a chronic problem.The problem is unchanged.  Associated symptoms include no fatigue, no appetite change and no headaches.  Past treatments include SNRIs - Serotonin and norepinephrine reuptake inhibitors (cymbalta). URI  This is a new problem. The current episode started more than 1 month ago. There has been no fever. Pertinent negatives include no abdominal pain, chest pain, coughing, dysuria or headaches. Treatments tried: clindamycin course recently completed.  Hyperlipidemia This is a chronic problem. The problem is uncontrolled. Recent lipid tests were reviewed and are high. Pertinent negatives include no chest pain or shortness of breath.    Lab Results  Component Value Date   NA 140 07/06/2021   K 4.6 07/06/2021   CO2 24 07/06/2021   GLUCOSE 124 (H) 07/06/2021   BUN 13 07/06/2021   CREATININE 0.62 07/06/2021   CALCIUM 10.1 07/06/2021   EGFR 99 07/06/2021   GFRNONAA >60 10/11/2020   Lab Results  Component Value Date   CHOL 241 (H) 03/07/2021   HDL 50 03/07/2021   LDLCALC 163 (H) 03/07/2021   LDLDIRECT 161.1 11/08/2010   TRIG 155 (H) 03/07/2021   CHOLHDL 4.8 (H) 03/07/2021   Lab Results  Component Value Date   TSH 1.600 03/07/2021   Lab Results  Component Value Date   HGBA1C 7.8 (A) 04/18/2022   Lab Results  Component Value Date   WBC 6.7 03/07/2021   HGB 13.8 03/07/2021   HCT 41.6 03/07/2021   MCV 90 03/07/2021   PLT 298  03/07/2021   Lab Results  Component Value Date   ALT 21 03/07/2021   AST 24 03/07/2021   ALKPHOS 143 (H) 03/07/2021   BILITOT 0.5 03/07/2021   Lab Results  Component Value Date   VD25OH 21.5 (L) 02/09/2020     Review of Systems  Constitutional:  Negative for appetite change, fatigue, fever and unexpected weight change.  HENT:  Negative for tinnitus and trouble swallowing.   Eyes:  Negative for visual disturbance.  Respiratory:  Negative for cough, chest tightness and shortness of breath.   Cardiovascular:  Negative for chest pain, palpitations and leg swelling.  Gastrointestinal:  Negative for abdominal pain.  Endocrine: Negative for polydipsia and polyuria.  Genitourinary:  Negative for dysuria and hematuria.  Musculoskeletal:  Negative for arthralgias.  Neurological:  Negative for tremors, numbness and headaches.  Psychiatric/Behavioral:  Positive for depression. Negative for dysphoric mood.     Patient Active Problem List   Diagnosis Date Noted   Anemia 11/08/2021   S/P hysterectomy 02/09/2020   Moderate episode of recurrent major depressive disorder ([place]) 02/09/2020   Neuropathy 07/30/2019   Osteopenia determined by x-ray 04/26/2019   Mixed hyperlipidemia 07/15/2017   Type II diabetes mellitus with complication ([place]) XX123456   Vitamin D deficiency 04/18/2017   Sleep apnea 02/28/2015   Obesity 02/28/2015   Hemorrhoids 03/04/2012   Insomnia 03/04/2012   Arthralgia 03/04/2012   Migraine without aura or status migrainosus  03/04/2012   Arthralgia of right temporomandibular joint 03/04/2012   Essential hypertension 11/08/2010    Allergies  Allergen Reactions   Amlodipine Swelling    And headache    Past Surgical History:  Procedure Laterality Date   ABDOMINAL HYSTERECTOMY  2008   for Menorrhagia   BREAST REDUCTION SURGERY     COLONOSCOPY     LAPAROSCOPY     OPEN REDUCTION INTERNAL FIXATION (ORIF) DISTAL RADIAL FRACTURE Right 10/12/2020   Procedure: OPEN  REDUCTION INTERNAL FIXATION (ORIF) DISTAL RADIAL FRACTURE;  Surgeon: Hessie Knows, MD;  Location: [facility] ORS;  Service: Orthopedics;  Laterality: Right;   REDUCTION MAMMAPLASTY Bilateral 1991    Social History   Tobacco Use   Smoking status: Never   Smokeless tobacco: Never  Vaping Use   Vaping Use: Never used  Substance Use Topics   Alcohol use: Not Currently    Alcohol/week: 2.0 standard drinks of alcohol    Types: 2 Glasses of wine per week   Drug use: Never     Medication list has been reviewed and updated.  Current Meds  Medication Sig   Ascorbic Acid (VITA-C PO) Take by mouth.   cholecalciferol (VITAMIN D3) 25 MCG (1000 UNIT) tablet Take 2,000 Units by mouth daily.   CINNAMON PO Take by mouth.   diazepam (VALIUM) 5 MG tablet Take 1 tablet (5 mg total) by mouth every 6 (six) hours as needed for anxiety.   DULoxetine (CYMBALTA) 60 MG capsule Take 2 capsules (120 mg total) by mouth daily.   ferrous sulfate 324 MG TBEC Take 324 mg by mouth daily with breakfast.   glucose blood (ONETOUCH ULTRA) test strip Use to test blood sugar twice a day   losartan-hydrochlorothiazide (HYZAAR) 100-12.5 MG tablet Take 1 tablet by mouth daily.   Multiple Vitamins-Minerals (ZINC PO) Take by mouth.   nebivolol (BYSTOLIC) 5 MG tablet Take 1 tablet (5 mg total) by mouth daily.   pravastatin (PRAVACHOL) 10 MG tablet Take 1 tablet (10 mg total) by mouth 2 (two) times a week.   vitamin B-12 (CYANOCOBALAMIN) 1000 MCG tablet Take 1,000 mcg by mouth daily.   [DISCONTINUED] dapagliflozin propanediol (FARXIGA) 10 MG TABS tablet Take 1 tablet (10 mg total) by mouth daily before breakfast.   [DISCONTINUED] zolpidem (AMBIEN) 10 MG tablet Take 1 tablet (10 mg total) by mouth at bedtime as needed for sleep.       04/18/2022    9:06 AM 04/08/2022   10:27 AM 10/26/2021    2:05 PM 08/15/2021    2:47 PM  GAD 7 : Generalized Anxiety Score  Nervous, Anxious, on Edge 1 2 2  0  Control/stop worrying 1 2 1 1   Worry  too much - different things 0 2 1 0  Trouble relaxing 1 2 1 1   Restless 0 0 1 0  Easily annoyed or irritable 1 2 0 2  Afraid - awful might happen 0 1 0 0  Total GAD 7 Score 4 11 6 4   Anxiety Difficulty Somewhat difficult Extremely difficult Somewhat difficult Somewhat difficult       04/18/2022    9:05 AM 04/08/2022   10:26 AM 10/26/2021    2:05 PM  Depression screen PHQ 2/9  Decreased Interest 1 3 0  Down, Depressed, Hopeless 1 3 1   PHQ - 2 Score 2 6 1   Altered sleeping 2 3 3   Tired, decreased energy 3 3 2   Change in appetite 2 3 2   Feeling bad or failure about yourself  1 3 1   Trouble concentrating 1 3 1   Moving slowly or fidgety/restless 0 3 0  Suicidal thoughts 0 1 0  PHQ-9 Score 11 25 10   Difficult doing work/chores Very difficult Extremely dIfficult Somewhat difficult    BP Readings from Last 3 Encounters:  04/18/22 122/72  04/08/22 (!) 175/96  04/03/22 122/70    Physical Exam Vitals and nursing note reviewed.  Constitutional:      General: She is not in acute distress.    Appearance: She is well-developed.  HENT:     Head: Normocephalic and atraumatic.     Right Ear: Tympanic membrane normal.     Left Ear: Tympanic membrane normal.     Nose:     Right Sinus: No maxillary sinus tenderness or frontal sinus tenderness.     Left Sinus: No maxillary sinus tenderness or frontal sinus tenderness.  Cardiovascular:     Rate and Rhythm: Normal rate and regular rhythm.  Pulmonary:     Effort: Pulmonary effort is normal. No respiratory distress.     Breath sounds: No wheezing or rhonchi.  Musculoskeletal:     Cervical back: Normal range of motion.  Skin:    General: Skin is warm and dry.     Findings: No rash.  Neurological:     Mental Status: She is alert and oriented to person, place, and time.  Psychiatric:        Mood and Affect: Mood normal.        Behavior: Behavior normal.     Wt Readings from Last 3 Encounters:  04/18/22 193 lb 6.4 oz (87.7 kg)   04/08/22 194 lb (88 kg)  04/03/22 195 lb (88.5 kg)    BP 122/72   Pulse 81   Ht 5' 4.5" (1.638 m)   Wt 193 lb 6.4 oz (87.7 kg)   SpO2 97%   BMI 32.68 kg/m   Assessment and Plan:  Problem List Items Addressed This Visit       Endocrine   Type II diabetes mellitus with complication (HCC) - Primary (Chronic)    Clinically stable without s/s of hypoglycemia. Tolerating farxiga well without side effects or other concerns. Lab Results  Component Value Date   HGBA1C 6.9 (A) 10/26/2021  A1C up to 7.8 today Will add Ozempic samples       Relevant Medications   pravastatin (PRAVACHOL) 10 MG tablet   dapagliflozin propanediol (FARXIGA) 10 MG TABS tablet   Other Relevant Orders   POCT glycosylated hemoglobin (Hb A1C) (Completed)     Other   Insomnia    Doing well on Ambien without side effects or evidence of misuse Risk outweighs benefit at this time      Relevant Medications   zolpidem (AMBIEN) 10 MG tablet   Mixed hyperlipidemia (Chronic)    Elevated 10 yr risk along with DM and HTN Has been repeatedly resistant to statin therapy  Will try pravchol 10 mg biw      Relevant Medications   pravastatin (PRAVACHOL) 10 MG tablet   Moderate episode of recurrent major depressive disorder (HCC) (Chronic)    Clinically stable on current regimen with improved control of symptoms, without SI or HI. On Cymbalta daily - continue current therapy         Return for Needs to schedule MAW with Lorrie.   Partially dictated using [place], any errors are not intentional.  Glean Hess, MD [place], [place]

## 2022-04-18 NOTE — Assessment & Plan Note (Addendum)
Clinically stable without s/s of hypoglycemia. Tolerating farxiga well without side effects or other concerns. Lab Results  Component Value Date   HGBA1C 6.9 (A) 10/26/2021  A1C up to 7.8 today Will add Ozempic samples

## 2022-05-02 ENCOUNTER — Ambulatory Visit
Admission: RE | Admit: 2022-05-02 | Discharge: 2022-05-02 | Disposition: A | Discharge status: Home / Self Care | Payer: PPO | Source: Ambulatory Visit | Attending: Internal Medicine | Admitting: Internal Medicine

## 2022-05-02 DIAGNOSIS — Z1231 Encounter for screening mammogram for malignant neoplasm of breast: Secondary | ICD-10-CM | POA: Insufficient documentation

## 2022-05-30 ENCOUNTER — Ambulatory Visit (INDEPENDENT_AMBULATORY_CARE_PROVIDER_SITE_OTHER): Payer: PPO

## 2022-05-30 VITALS — Ht 64.5 in | Wt 193.0 lb

## 2022-05-30 DIAGNOSIS — Z0000 Encounter for general adult medical examination without abnormal findings: Secondary | ICD-10-CM | POA: Diagnosis not present

## 2022-05-30 DIAGNOSIS — Z1211 Encounter for screening for malignant neoplasm of colon: Secondary | ICD-10-CM

## 2022-05-30 NOTE — Progress Notes (Signed)
I connected with  [name] on 05/30/22 by a audio enabled telemedicine application and verified that I am speaking with the correct person using two identifiers.  Patient Location: Home  Provider Location: Office/Clinic  I discussed the limitations of evaluation and management by telemedicine. The patient expressed understanding and agreed to proceed.  Subjective:   [name] is a 67 y.o. female who presents for an Initial Medicare Annual Wellness Visit.  Review of Systems     Cardiac Risk Factors include: advanced age (>24men, >62 women);diabetes mellitus;hypertension;sedentary lifestyle     Objective:    Today's Vitals   05/30/22 0937  PainSc: 0-No pain   There is no height or weight on file to calculate BMI.     05/30/2022    9:44 AM 10/12/2020    8:08 AM 10/11/2020   10:35 AM 10/07/2020    9:25 PM 02/22/2020   10:58 AM 05/20/2018    3:54 PM 02/09/2016    1:58 PM  Advanced Directives  Does Patient Have a Medical Advance Directive? No No No No Yes No Yes  Type of Agricultural consultant;Living will  Living will  Does patient want to make changes to medical advance directive?     No - Patient declined    Would patient like information on creating a medical advance directive? No - Patient declined No - Patient declined  Yes (ED - Information included in AVS)       Current Medications (verified) Outpatient Encounter Medications as of 05/30/2022  Medication Sig   Ascorbic Acid (VITA-C PO) Take by mouth.   cholecalciferol (VITAMIN D3) 25 MCG (1000 UNIT) tablet Take 2,000 Units by mouth daily.   dapagliflozin propanediol (FARXIGA) 10 MG TABS tablet Take 1 tablet (10 mg total) by mouth daily before breakfast.   diazepam (VALIUM) 5 MG tablet Take 1 tablet (5 mg total) by mouth every 6 (six) hours as needed for anxiety.   glucose blood (ONETOUCH ULTRA) test strip Use to test blood sugar twice a day   losartan-hydrochlorothiazide (HYZAAR)  100-12.5 MG tablet Take 1 tablet by mouth daily.   nebivolol (BYSTOLIC) 5 MG tablet Take 1 tablet (5 mg total) by mouth daily.   pravastatin (PRAVACHOL) 10 MG tablet Take 1 tablet (10 mg total) by mouth 2 (two) times a week.   zolpidem (AMBIEN) 10 MG tablet Take 1 tablet (10 mg total) by mouth at bedtime as needed for sleep.   CINNAMON PO Take by mouth. (Patient not taking: Reported on 05/30/2022)   DULoxetine (CYMBALTA) 60 MG capsule Take 2 capsules (120 mg total) by mouth daily.   ferrous sulfate 324 MG TBEC Take 324 mg by mouth daily with breakfast. (Patient not taking: Reported on 05/30/2022)   Multiple Vitamins-Minerals (ZINC PO) Take by mouth. (Patient not taking: Reported on 05/30/2022)   vitamin B-12 (CYANOCOBALAMIN) 1000 MCG tablet Take 1,000 mcg by mouth daily. (Patient not taking: Reported on 05/30/2022)   No facility-administered encounter medications on file as of 05/30/2022.    Allergies (verified) Amlodipine   History: Past Medical History:  Diagnosis Date   Anemia    Anxiety    Arthritis    Complication of anesthesia    Depression    Diabetes mellitus without complication (HCC)    GERD (gastroesophageal reflux disease)    Headache    Hypertension    PONV (postoperative nausea and vomiting)    Past Surgical History:  Procedure Laterality Date  ABDOMINAL HYSTERECTOMY  2008   for Menorrhagia   BREAST REDUCTION SURGERY     COLONOSCOPY     LAPAROSCOPY     OPEN REDUCTION INTERNAL FIXATION (ORIF) DISTAL RADIAL FRACTURE Right 10/12/2020   Procedure: OPEN REDUCTION INTERNAL FIXATION (ORIF) DISTAL RADIAL FRACTURE;  Surgeon: Kennedy Bucker, MD;  Location: [facility] ORS;  Service: Orthopedics;  Laterality: Right;   REDUCTION MAMMAPLASTY Bilateral 1991   Family History  Problem Relation Age of Onset   Diabetes Daughter    Hypertension Daughter    Breast cancer Maternal Aunt    Diabetes Maternal Uncle    Social History   Socioeconomic History   Marital status: Widowed    Spouse  name: Not on file   Number of children: 1   Years of education: Not on file   Highest education level: Not on file  Occupational History   Not on file  Tobacco Use   Smoking status: Never   Smokeless tobacco: Never  Vaping Use   Vaping Use: Never used  Substance and Sexual Activity   Alcohol use: Not Currently    Alcohol/week: 2.0 standard drinks of alcohol    Types: 2 Glasses of wine per week   Drug use: Never   Sexual activity: Not Currently    Partners: Male    Birth control/protection: Surgical  Other Topics Concern   Not on file  Social History Narrative   ** Merged History Encounter **       Social Determinants of Health   Financial Resource Strain: Low Risk  (05/30/2022)   Overall Financial Resource Strain (CARDIA)    Difficulty of Paying Living Expenses: Not very hard  Food Insecurity: No Food Insecurity (05/30/2022)   Hunger Vital Sign    Worried About Running Out of Food in the Last Year: Never true    Ran Out of Food in the Last Year: Never true  Transportation Needs: No Transportation Needs (05/30/2022)   PRAPARE - Administrator, Civil Service (Medical): No    Lack of Transportation (Non-Medical): No  Physical Activity: Insufficiently Active (05/30/2022)   Exercise Vital Sign    Days of Exercise per Week: 3 days    Minutes of Exercise per Session: 30 min  Stress: No Stress Concern Present (05/30/2022)   [employer] of Occupational Health - Occupational Stress Questionnaire    Feeling of Stress : Only a little  Social Connections: Socially Isolated (05/30/2022)   Social Connection and Isolation Panel [NHANES]    Frequency of Communication with Friends and Family: Twice a week    Frequency of Social Gatherings with Friends and Family: Twice a week    Attends Religious Services: Never    Database administrator or Organizations: No    Attends Banker Meetings: Never    Marital Status: Widowed    Tobacco Counseling Counseling given:  Not Answered   Clinical Intake:  Pre-visit preparation completed: Yes  Pain : No/denies pain Pain Score: 0-No pain     Nutritional Risks: None Diabetes: Yes CBG done?: No Did pt. bring in CBG monitor from home?: No  How often do you need to have someone help you when you read instructions, pamphlets, or other written materials from your doctor or pharmacy?: 1 - Never  Diabetic?yes Nutrition Risk Assessment:  Has the patient had any N/V/D within the last 2 months?  Yes  Does the patient have any non-healing wounds?  No  Has the patient had any unintentional weight loss  or weight gain?  No   Diabetes:  Is the patient diabetic?  Yes  If diabetic, was a CBG obtained today?  No  Did the patient bring in their glucometer from home?  No  How often do you monitor your CBG's? Once per day.   Financial Strains and Diabetes Management:  Are you having any financial strains with the device, your supplies or your medication? No .  Does the patient want to be seen by Chronic Care Management for management of their diabetes?  No  Would the patient like to be referred to a Nutritionist or for Diabetic Management?  No   Diabetic Exams:  Diabetic Eye Exam: Completed 09/12/21. Marland Kitchen Pt has been advised about the importance in completing this exam.  Diabetic Foot Exam: Completed 03/07/21. Pt has been advised about the importance in completing this exam.   Interpreter Needed?: No  Information entered by :: Kennedy Bucker, LPN   Activities of Daily Living    05/30/2022    9:45 AM  In your present state of health, do you have any difficulty performing the following activities:  Hearing? 0  Vision? 0  Difficulty concentrating or making decisions? 0  Walking or climbing stairs? 0  Dressing or bathing? 0  Doing errands, shopping? 0  Preparing Food and eating ? N  Using the Toilet? N  In the past six months, have you accidently leaked urine? N  Do you have problems with loss of bowel  control? N  Managing your Medications? N  Managing your Finances? N  Housekeeping or managing your Housekeeping? N    Patient Care Team: Reubin Milan, MD as PCP - General (Internal Medicine) Shane Crutch, MD as Consulting Physician (Pulmonary Disease) Troxler, Blanchie Serve (Inactive) as Attending Physician (Podiatry) [facility] as Attending Physician (Ophthalmology)  Indicate any recent Medical Services you may have received from other than [facility] providers in the past year (date may be approximate).     Assessment:   This is a routine wellness examination for Opal.  Hearing/Vision screen Hearing Screening - Comments:: No aids Vision Screening - Comments:: Wears glasses- [facility]   Dietary issues and exercise activities discussed: Current Exercise Habits: Home exercise routine, Type of exercise: walking, Time (Minutes): 30, Frequency (Times/Week): 3, Weekly Exercise (Minutes/Week): 90, Intensity: Mild   Goals Addressed             This Visit's Progress    DIET - EAT MORE FRUITS AND VEGETABLES         Depression Screen    05/30/2022    9:42 AM 04/18/2022    9:05 AM 04/08/2022   10:26 AM 10/26/2021    2:05 PM 08/15/2021    2:47 PM 07/06/2021   10:29 AM 03/07/2021   10:36 AM  PHQ 2/9 Scores  PHQ - 2 Score 2 2 6 1 5 6 1   PHQ- 9 Score 4 11 25 10 13 22 9     Fall Risk    05/30/2022    9:45 AM 04/18/2022    9:06 AM 04/08/2022   10:28 AM 10/26/2021    2:05 PM 08/15/2021    2:47 PM  Fall Risk   Falls in the past year? 1 1 1 1  0  Number falls in past yr: 0 0 0 1 0  Injury with Fall? 1 1 1  0 0  Risk for fall due to : History of fall(s) No Fall Risks No Fall Risks No Fall Risks No Fall Risks  Follow  up Falls evaluation completed;Falls prevention discussed Falls evaluation completed Falls evaluation completed Falls evaluation completed Falls evaluation completed    FALL RISK PREVENTION PERTAINING TO THE HOME:  Any stairs in or around the home? Yes  If so,  are there any without handrails? No  Home free of loose throw rugs in walkways, pet beds, electrical cords, etc? Yes  Adequate lighting in your home to reduce risk of falls? Yes   ASSISTIVE DEVICES UTILIZED TO PREVENT FALLS:  Life alert? No  Use of a cane, walker or w/c? No  Grab bars in the bathroom? No  Shower chair or bench in shower? No  Elevated toilet seat or a handicapped toilet? Yes   Cognitive Function:        05/30/2022    9:51 AM  6CIT Screen  What Year? 0 points  What month? 0 points  What time? 0 points  Count back from 20 0 points  Months in reverse 0 points  Repeat phrase 2 points  Total Score 2 points    Immunizations Immunization History  Administered Date(s) Administered   Fluad Quad(high Dose 65+) 10/26/2021   Influenza Split 09/21/2014   Influenza,inj,Quad PF,6+ Mos 11/02/2016, 10/28/2017, 11/03/2018, 10/25/2020   Influenza-Unspecified 11/04/2012, 11/04/2016, 11/03/2018, 10/12/2019, 10/25/2020   Moderna Sars-Covid-2 Vaccination 10/12/2019, 11/09/2019   PNEUMOCOCCAL CONJUGATE-20 09/15/2020   Tdap 07/02/2016   Zoster Recombinat (Shingrix) 12/02/2018, 03/08/2019    TDAP status: Up to date  Flu Vaccine status: Up to date  Pneumococcal vaccine status: Up to date  Covid-19 vaccine status: Completed vaccines  Qualifies for Shingles Vaccine? Yes   Zostavax completed No   Shingrix Completed?: Yes  Screening Tests Health Maintenance  Topic Date Due   COVID-19 Vaccine (3 - 2023-24 season) 09/28/2021   FOOT EXAM  03/07/2022   COLONOSCOPY (Pts 45-49yrs Insurance coverage will need to be confirmed)  04/22/2022   Diabetic kidney evaluation - eGFR measurement  07/07/2022   Diabetic kidney evaluation - Urine ACR  07/07/2022   INFLUENZA VACCINE  08/29/2022   OPHTHALMOLOGY EXAM  09/13/2022   HEMOGLOBIN A1C  10/19/2022   MAMMOGRAM  05/02/2023   Medicare Annual Wellness (AWV)  05/30/2023   DTaP/Tdap/Td (2 - Td or Tdap) 07/03/2026   Pneumonia Vaccine 13+  Years old  Completed   DEXA SCAN  Completed   Zoster Vaccines- Shingrix  Completed   Hepatitis C Screening  Addressed   HPV VACCINES  Aged Out    Health Maintenance  Health Maintenance Due  Topic Date Due   COVID-19 Vaccine (3 - 2023-24 season) 09/28/2021   FOOT EXAM  03/07/2022   COLONOSCOPY (Pts 45-1yrs Insurance coverage will need to be confirmed)  04/22/2022    Colorectal cancer screening: Type of screening: Colonoscopy. Completed 04/21/12. Repeat every 10 years  Mammogram status: Completed 05/02/22. Repeat every year  Bone Density status: Completed 04/26/19. Results reflect: Bone density results: OSTEOPENIA. Repeat every 5 years.  Lung Cancer Screening: (Low Dose CT Chest recommended if Age 14-80 years, 30 pack-year currently smoking OR have quit w/in 15years.) does not qualify.   Additional Screening:  Hepatitis C Screening: does qualify; Completed 07/12/12  Vision Screening: Recommended annual ophthalmology exams for early detection of glaucoma and other disorders of the eye. Is the patient up to date with their annual eye exam?  Yes  Who is the provider or what is the name of the office in which the patient attends annual eye exams? [facility]  If pt is not established with a provider,  would they like to be referred to a provider to establish care? No .   Dental Screening: Recommended annual dental exams for proper oral hygiene  Community Resource Referral / Chronic Care Management: CRR required this visit?  No   CCM required this visit?  No      Plan:     I have personally reviewed and noted the following in the patient's chart:   Medical and social history Use of alcohol, tobacco or illicit drugs  Current medications and supplements including opioid prescriptions. Patient is not currently taking opioid prescriptions. Functional ability and status Nutritional status Physical activity Advanced directives List of other physicians Hospitalizations,  surgeries, and ER visits in previous 12 months Vitals Screenings to include cognitive, depression, and falls Referrals and appointments  In addition, I have reviewed and discussed with patient certain preventive protocols, quality metrics, and best practice recommendations. A written personalized care plan for preventive services as well as general preventive health recommendations were provided to patient.     Hal Hope, LPN   02/02/1094   Nurse Notes: none

## 2022-05-30 NOTE — Patient Instructions (Signed)
[name] , Thank you for taking time to come for your Medicare Wellness Visit. I appreciate your ongoing commitment to your health goals. Please review the following plan we discussed and let me know if I can assist you in the future.   These are the goals we discussed:  Goals      DIET - EAT MORE FRUITS AND VEGETABLES        This is a list of the screening recommended for you and due dates:  Health Maintenance  Topic Date Due   COVID-19 Vaccine (3 - 2023-24 season) 09/28/2021   Complete foot exam   03/07/2022   Colon Cancer Screening  04/22/2022   Yearly kidney function blood test for diabetes  07/07/2022   Yearly kidney health urinalysis for diabetes  07/07/2022   Flu Shot  08/29/2022   Eye exam for diabetics  09/13/2022   Hemoglobin A1C  10/19/2022   Mammogram  05/02/2023   Medicare Annual Wellness Visit  05/30/2023   DTaP/Tdap/Td vaccine (2 - Td or Tdap) 07/03/2026   Pneumonia Vaccine  Completed   DEXA scan (bone density measurement)  Completed   Zoster (Shingles) Vaccine  Completed   Hepatitis C Screening: USPSTF Recommendation to screen - Ages 66-79 yo.  Addressed   HPV Vaccine  Aged Out    Advanced directives: no  Conditions/risks identified: none  Next appointment: Follow up in one year for your annual wellness visit 06/05/23 @ 9:00 am by phone   Preventive Care 65 Years and Older, Female Preventive care refers to lifestyle choices and visits with your health care provider that can promote health and wellness. What does preventive care include? A yearly physical exam. This is also called an annual well check. Dental exams once or twice a year. Routine eye exams. Ask your health care provider how often you should have your eyes checked. Personal lifestyle choices, including: Daily care of your teeth and gums. Regular physical activity. Eating a healthy diet. Avoiding tobacco and drug use. Limiting alcohol use. Practicing safe sex. Taking low-dose aspirin  every day. Taking vitamin and mineral supplements as recommended by your health care provider. What happens during an annual well check? The services and screenings done by your health care provider during your annual well check will depend on your age, overall health, lifestyle risk factors, and family history of disease. Counseling  Your health care provider may ask you questions about your: Alcohol use. Tobacco use. Drug use. Emotional well-being. Home and relationship well-being. Sexual activity. Eating habits. History of falls. Memory and ability to understand (cognition). Work and work Astronomer. Reproductive health. Screening  You may have the following tests or measurements: Height, weight, and BMI. Blood pressure. Lipid and cholesterol levels. These may be checked every 5 years, or more frequently if you are over 47 years old. Skin check. Lung cancer screening. You may have this screening every year starting at age 5 if you have a 30-pack-year history of smoking and currently smoke or have quit within the past 15 years. Fecal occult blood test (FOBT) of the stool. You may have this test every year starting at age 23. Flexible sigmoidoscopy or colonoscopy. You may have a sigmoidoscopy every 5 years or a colonoscopy every 10 years starting at age 48. Hepatitis C blood test. Hepatitis B blood test. Sexually transmitted disease (STD) testing. Diabetes screening. This is done by checking your blood sugar (glucose) after you have not eaten for a while (fasting). You may have this done every  1-3 years. Bone density scan. This is done to screen for osteoporosis. You may have this done starting at age 35. Mammogram. This may be done every 1-2 years. Talk to your health care provider about how often you should have regular mammograms. Talk with your health care provider about your test results, treatment options, and if necessary, the need for more tests. Vaccines  Your health  care provider may recommend certain vaccines, such as: Influenza vaccine. This is recommended every year. Tetanus, diphtheria, and acellular pertussis (Tdap, Td) vaccine. You may need a Td booster every 10 years. Zoster vaccine. You may need this after age 40. Pneumococcal 13-valent conjugate (PCV13) vaccine. One dose is recommended after age 70. Pneumococcal polysaccharide (PPSV23) vaccine. One dose is recommended after age 74. Talk to your health care provider about which screenings and vaccines you need and how often you need them. This information is not intended to replace advice given to you by your health care provider. Make sure you discuss any questions you have with your health care provider. Document Released: 02/10/2015 Document Revised: 10/04/2015 Document Reviewed: 11/15/2014 Elsevier Interactive Patient Education  2017 [place] Prevention in the Home Falls can cause injuries. They can happen to people of all ages. There are many things you can do to make your home safe and to help prevent falls. What can I do on the outside of my home? Regularly fix the edges of walkways and driveways and fix any cracks. Remove anything that might make you trip as you walk through a door, such as a raised step or threshold. Trim any bushes or trees on the path to your home. Use bright outdoor lighting. Clear any walking paths of anything that might make someone trip, such as rocks or tools. Regularly check to see if handrails are loose or broken. Make sure that both sides of any steps have handrails. Any raised decks and porches should have guardrails on the edges. Have any leaves, snow, or ice cleared regularly. Use sand or salt on walking paths during winter. Clean up any spills in your garage right away. This includes oil or grease spills. What can I do in the bathroom? Use night lights. Install grab bars by the toilet and in the tub and shower. Do not use towel bars as grab  bars. Use non-skid mats or decals in the tub or shower. If you need to sit down in the shower, use a plastic, non-slip stool. Keep the floor dry. Clean up any water that spills on the floor as soon as it happens. Remove soap buildup in the tub or shower regularly. Attach bath mats securely with double-sided non-slip rug tape. Do not have throw rugs and other things on the floor that can make you trip. What can I do in the bedroom? Use night lights. Make sure that you have a light by your bed that is easy to reach. Do not use any sheets or blankets that are too big for your bed. They should not hang down onto the floor. Have a firm chair that has side arms. You can use this for support while you get dressed. Do not have throw rugs and other things on the floor that can make you trip. What can I do in the kitchen? Clean up any spills right away. Avoid walking on wet floors. Keep items that you use a lot in easy-to-reach places. If you need to reach something above you, use a strong step stool that has a  grab bar. Keep electrical cords out of the way. Do not use floor polish or wax that makes floors slippery. If you must use wax, use non-skid floor wax. Do not have throw rugs and other things on the floor that can make you trip. What can I do with my stairs? Do not leave any items on the stairs. Make sure that there are handrails on both sides of the stairs and use them. Fix handrails that are broken or loose. Make sure that handrails are as long as the stairways. Check any carpeting to make sure that it is firmly attached to the stairs. Fix any carpet that is loose or worn. Avoid having throw rugs at the top or bottom of the stairs. If you do have throw rugs, attach them to the floor with carpet tape. Make sure that you have a light switch at the top of the stairs and the bottom of the stairs. If you do not have them, ask someone to add them for you. What else can I do to help prevent  falls? Wear shoes that: Do not have high heels. Have rubber bottoms. Are comfortable and fit you well. Are closed at the toe. Do not wear sandals. If you use a stepladder: Make sure that it is fully opened. Do not climb a closed stepladder. Make sure that both sides of the stepladder are locked into place. Ask someone to hold it for you, if possible. Clearly mark and make sure that you can see: Any grab bars or handrails. First and last steps. Where the edge of each step is. Use tools that help you move around (mobility aids) if they are needed. These include: Canes. Walkers. Scooters. Crutches. Turn on the lights when you go into a dark area. Replace any light bulbs as soon as they burn out. Set up your furniture so you have a clear path. Avoid moving your furniture around. If any of your floors are uneven, fix them. If there are any pets around you, be aware of where they are. Review your medicines with your doctor. Some medicines can make you feel dizzy. This can increase your chance of falling. Ask your doctor what other things that you can do to help prevent falls. This information is not intended to replace advice given to you by your health care provider. Make sure you discuss any questions you have with your health care provider. Document Released: 11/10/2008 Document Revised: 06/22/2015 Document Reviewed: 02/18/2014 Elsevier Interactive Patient Education  2017 [employer].

## 2022-06-03 ENCOUNTER — Other Ambulatory Visit: Payer: Self-pay | Admitting: Internal Medicine

## 2022-06-03 DIAGNOSIS — I10 Essential (primary) hypertension: Secondary | ICD-10-CM

## 2022-06-03 NOTE — Telephone Encounter (Signed)
Pt says is at pharmacy waiting to pick up and is out of med losartan. I let her knpow of 48-72 hr turn around

## 2022-06-04 ENCOUNTER — Telehealth: Payer: Self-pay

## 2022-06-04 ENCOUNTER — Other Ambulatory Visit: Payer: Self-pay | Admitting: *Deleted

## 2022-06-04 ENCOUNTER — Telehealth: Payer: Self-pay | Admitting: *Deleted

## 2022-06-04 DIAGNOSIS — Z1211 Encounter for screening for malignant neoplasm of colon: Secondary | ICD-10-CM

## 2022-06-04 NOTE — Telephone Encounter (Signed)
Gastroenterology Pre-Procedure Review  Request Date: 06/20/2022 Requesting Physician: Dr. Tobi Bastos  PATIENT REVIEW QUESTIONS: The patient responded to the following health history questions as indicated:    1. Are you having any GI issues? no 2. Do you have a personal history of Polyps? no 3. Do you have a family history of Colon Cancer or Polyps? no 4. Diabetes Mellitus? yes (taking Farxiga) 5. Joint replacements in the past 12 months?no 6. Major health problems in the past 3 months?no 7. Any artificial heart valves, MVP, or defibrillator?no    MEDICATIONS & ALLERGIES:    Patient reports the following regarding taking any anticoagulation/antiplatelet therapy:   Plavix, Coumadin, Eliquis, Xarelto, Lovenox, Pradaxa, Brilinta, or Effient? no Aspirin? no  Patient confirms/reports the following medications:  Current Outpatient Medications  Medication Sig Dispense Refill   Ascorbic Acid (VITA-C PO) Take by mouth.     cholecalciferol (VITAMIN D3) 25 MCG (1000 UNIT) tablet Take 2,000 Units by mouth daily.     CINNAMON PO Take by mouth. (Patient not taking: Reported on 05/30/2022)     dapagliflozin propanediol (FARXIGA) 10 MG TABS tablet Take 1 tablet (10 mg total) by mouth daily before breakfast. 90 tablet 3   diazepam (VALIUM) 5 MG tablet Take 1 tablet (5 mg total) by mouth every 6 (six) hours as needed for anxiety. 30 tablet 0   DULoxetine (CYMBALTA) 60 MG capsule Take 2 capsules (120 mg total) by mouth daily. 180 capsule 0   ferrous sulfate 324 MG TBEC Take 324 mg by mouth daily with breakfast. (Patient not taking: Reported on 05/30/2022)     glucose blood (ONETOUCH ULTRA) test strip Use to test blood sugar twice a day 100 each 12   losartan-hydrochlorothiazide (HYZAAR) 100-12.5 MG tablet Take 1 tablet by mouth daily. 30 tablet 0   Multiple Vitamins-Minerals (ZINC PO) Take by mouth. (Patient not taking: Reported on 05/30/2022)     nebivolol (BYSTOLIC) 5 MG tablet Take 1 tablet (5 mg total) by mouth  daily. 90 tablet 0   pravastatin (PRAVACHOL) 10 MG tablet Take 1 tablet (10 mg total) by mouth 2 (two) times a week. 24 tablet 1   vitamin B-12 (CYANOCOBALAMIN) 1000 MCG tablet Take 1,000 mcg by mouth daily. (Patient not taking: Reported on 05/30/2022)     zolpidem (AMBIEN) 10 MG tablet Take 1 tablet (10 mg total) by mouth at bedtime as needed for sleep. 30 tablet 5   No current facility-administered medications for this visit.    Patient confirms/reports the following allergies:  Allergies  Allergen Reactions   Amlodipine Swelling    And headache    No orders of the defined types were placed in this encounter.   AUTHORIZATION INFORMATION Primary Insurance: 1D#: Group #:  Secondary Insurance: 1D#: Group #:  SCHEDULE INFORMATION: Date: 06/20/2022 Time: Location: [facility]

## 2022-06-04 NOTE — Telephone Encounter (Signed)
Pt LVM to schedule colonoscopy

## 2022-06-04 NOTE — Telephone Encounter (Signed)
Colonoscopy schedule on 06/20/2022

## 2022-06-06 ENCOUNTER — Ambulatory Visit (INDEPENDENT_AMBULATORY_CARE_PROVIDER_SITE_OTHER): Payer: PPO | Admitting: Internal Medicine

## 2022-06-06 ENCOUNTER — Encounter: Payer: Self-pay | Admitting: Internal Medicine

## 2022-06-06 VITALS — BP 128/70 | HR 62 | Ht 64.5 in | Wt 191.0 lb

## 2022-06-06 DIAGNOSIS — E118 Type 2 diabetes mellitus with unspecified complications: Secondary | ICD-10-CM | POA: Diagnosis not present

## 2022-06-06 DIAGNOSIS — Z7984 Long term (current) use of oral hypoglycemic drugs: Secondary | ICD-10-CM | POA: Diagnosis not present

## 2022-06-06 DIAGNOSIS — I10 Essential (primary) hypertension: Secondary | ICD-10-CM | POA: Diagnosis not present

## 2022-06-06 DIAGNOSIS — E611 Iron deficiency: Secondary | ICD-10-CM | POA: Diagnosis not present

## 2022-06-06 DIAGNOSIS — E559 Vitamin D deficiency, unspecified: Secondary | ICD-10-CM | POA: Diagnosis not present

## 2022-06-06 DIAGNOSIS — E1169 Type 2 diabetes mellitus with other specified complication: Secondary | ICD-10-CM

## 2022-06-06 DIAGNOSIS — E782 Mixed hyperlipidemia: Secondary | ICD-10-CM

## 2022-06-06 DIAGNOSIS — G4733 Obstructive sleep apnea (adult) (pediatric): Secondary | ICD-10-CM

## 2022-06-06 DIAGNOSIS — F331 Major depressive disorder, recurrent, moderate: Secondary | ICD-10-CM | POA: Diagnosis not present

## 2022-06-06 DIAGNOSIS — E785 Hyperlipidemia, unspecified: Secondary | ICD-10-CM

## 2022-06-06 DIAGNOSIS — Z0000 Encounter for general adult medical examination without abnormal findings: Secondary | ICD-10-CM

## 2022-06-06 DIAGNOSIS — K219 Gastro-esophageal reflux disease without esophagitis: Secondary | ICD-10-CM | POA: Diagnosis not present

## 2022-06-06 MED ORDER — DULOXETINE HCL 60 MG PO CPEP
120.0000 mg | ORAL_CAPSULE | Freq: Every day | ORAL | 0 refills | Status: DC
Start: 2022-06-06 — End: 2022-10-11

## 2022-06-06 MED ORDER — DIAZEPAM 5 MG PO TABS
5.0000 mg | ORAL_TABLET | Freq: Four times a day (QID) | ORAL | 0 refills | Status: DC | PRN
Start: 2022-06-06 — End: 2023-03-10

## 2022-06-06 MED ORDER — PRAVASTATIN SODIUM 10 MG PO TABS: 10.0000 mg | ORAL_TABLET | ORAL | 1 refills | Status: DC

## 2022-06-06 MED ORDER — FAMOTIDINE 20 MG PO TABS: 20.0000 mg | ORAL_TABLET | Freq: Two times a day (BID) | ORAL | 1 refills | Status: DC

## 2022-06-06 MED ORDER — LOSARTAN POTASSIUM-HCTZ 100-12.5 MG PO TABS
1.0000 | ORAL_TABLET | Freq: Every day | ORAL | 1 refills | Status: DC
Start: 2022-06-06 — End: 2022-10-11

## 2022-06-06 MED ORDER — NEBIVOLOL HCL 5 MG PO TABS: 5.0000 mg | ORAL_TABLET | Freq: Every day | ORAL | 1 refills | Status: DC

## 2022-06-06 NOTE — Assessment & Plan Note (Addendum)
Cholesterol elevated - statin started last visit and tolerating well Pravachol 10 mg biw - will continue

## 2022-06-06 NOTE — Assessment & Plan Note (Signed)
Level low in 2019 and 2022 DEXA shows osteopenia. Will recheck level today.

## 2022-06-06 NOTE — Patient Instructions (Signed)
-  It was a pleasure to see you today! Please review your visit summary for helpful information. -Lab results are usually available within 1-2 days and we will call once reviewed. -I would encourage you to follow your care via MyChart where you can access lab results, notes, messages, and more. -If you feel that we did a nice job today, please complete your after-visit survey and leave us a Google review! Your CMA today was Gryffin Altice and your provider was Dr Laura Berglund, MD.  

## 2022-06-06 NOTE — Assessment & Plan Note (Signed)
Never able to wear the face mask. No recent testing. Symptoms appear to be worsening with fatigue, somnolence and depression. Will refer to ENT to explore other treatment options

## 2022-06-06 NOTE — Assessment & Plan Note (Signed)
Stable exam with well controlled BP.  Currently taking losartan, hctz and bystolic. Tolerating medications without concerns or side effects. Will continue to recommend low sodium diet and current regimen.

## 2022-06-06 NOTE — Assessment & Plan Note (Addendum)
Blood sugars stable without hypoglycemic symptoms or events. Currently being treated with [place]. Ozempic sample given last visit caused nausea and constipation. She is willing to try 0.25 mg ozempic without dose increase, esp if A1C has improved. Lab Results  Component Value Date   HGBA1C 7.8 (A) 04/18/2022

## 2022-06-06 NOTE — Assessment & Plan Note (Signed)
Clinically stable on current regimen with good control of symptoms, No SI or HI. No change in current therapy recommended.

## 2022-06-06 NOTE — Progress Notes (Signed)
Date:  06/06/2022   Name:  [name]   DOB:  1955/08/14   MRN:  [id number]   Chief Complaint: Annual Exam and Fatigue (Wants Coritsol, Iron, and TSH checked. ) [name] is a 67 y.o. female who presents today for her Complete Annual Exam. She feels poorly. She reports exercising- none. She reports she is sleeping poorly. Breast complaints - none.  Mammogram: 4/20224 DEXA: 03/2019 osteopenia Pap smear: discontinued Colonoscopy: 03/2012 - scheduled  Health Maintenance Due  Topic Date Due   COVID-19 Vaccine (3 - 2023-24 season) 09/28/2021   COLONOSCOPY (Pts 45-7yrs Insurance coverage will need to be confirmed)  04/22/2022   Diabetic kidney evaluation - eGFR measurement  07/07/2022   Diabetic kidney evaluation - Urine ACR  07/07/2022    Immunization History  Administered Date(s) Administered   Fluad Quad(high Dose 65+) 10/26/2021   Influenza Split 09/21/2014   Influenza,inj,Quad PF,6+ Mos 11/02/2016, 10/28/2017, 11/03/2018, 10/25/2020   Influenza-Unspecified 11/04/2012, 11/04/2016, 11/03/2018, 10/12/2019, 10/25/2020   Moderna Sars-Covid-2 Vaccination 10/12/2019, 11/09/2019   PNEUMOCOCCAL CONJUGATE-20 09/15/2020   Tdap 07/02/2016   Zoster Recombinat (Shingrix) 12/02/2018, 03/08/2019    Hypertension This is a chronic problem. The problem is controlled. Pertinent negatives include no chest pain, headaches, palpitations or shortness of breath. Past treatments include angiotensin blockers, diuretics and beta blockers. The current treatment provides significant improvement. There is no history of kidney disease, CAD/MI or CVA.  Diabetes She presents for her follow-up diabetic visit. She has type 2 diabetes mellitus. Hypoglycemia symptoms include nervousness/anxiousness. Pertinent negatives for hypoglycemia include no dizziness, headaches or tremors. Associated symptoms include fatigue. Pertinent negatives for diabetes include no chest pain, no polydipsia and no polyuria.  Pertinent negatives for diabetic complications include no CVA.  Hyperlipidemia This is a chronic problem. Pertinent negatives include no chest pain or shortness of breath. Current antihyperlipidemic treatment includes statins (started last visit).  Depression        This is a chronic problem.  The problem has been waxing and waning since onset.  Associated symptoms include fatigue, insomnia and decreased interest.  Associated symptoms include no headaches and no suicidal ideas.     The symptoms are aggravated by nothing.  Past treatments include SNRIs - Serotonin and norepinephrine reuptake inhibitors. OSA - tested years ago but was never able to tolerate the face mask so she quit trying.  Sleeps lightly, has bad dreams at times, wakes up tired and takes daytime naps without improvement in fatigue.  Depression is worse - has spells of tearfulness every few days.  No episodes of mania.   Lab Results  Component Value Date   NA 140 07/06/2021   K 4.6 07/06/2021   CO2 24 07/06/2021   GLUCOSE 124 (H) 07/06/2021   BUN 13 07/06/2021   CREATININE 0.62 07/06/2021   CALCIUM 10.1 07/06/2021   EGFR 99 07/06/2021   GFRNONAA >60 10/11/2020   Lab Results  Component Value Date   CHOL 241 (H) 03/07/2021   HDL 50 03/07/2021   LDLCALC 163 (H) 03/07/2021   LDLDIRECT 161.1 11/08/2010   TRIG 155 (H) 03/07/2021   CHOLHDL 4.8 (H) 03/07/2021   Lab Results  Component Value Date   TSH 1.600 03/07/2021   Lab Results  Component Value Date   HGBA1C 7.8 (A) 04/18/2022   Lab Results  Component Value Date   WBC 6.7 03/07/2021   HGB 13.8 03/07/2021   HCT 41.6 03/07/2021   MCV 90 03/07/2021   PLT 298 03/07/2021  Lab Results  Component Value Date   ALT 21 03/07/2021   AST 24 03/07/2021   ALKPHOS 143 (H) 03/07/2021   BILITOT 0.5 03/07/2021   Lab Results  Component Value Date   VD25OH 21.5 (L) 02/09/2020     Review of Systems  Constitutional:  Positive for fatigue. Negative for chills, fever and  unexpected weight change.  HENT:  Negative for congestion, hearing loss, tinnitus, trouble swallowing and voice change.   Eyes:  Negative for visual disturbance.  Respiratory:  Negative for cough, chest tightness, shortness of breath and wheezing.   Cardiovascular:  Negative for chest pain, palpitations and leg swelling.  Gastrointestinal:  Negative for abdominal pain, constipation, diarrhea and vomiting.  Endocrine: Negative for polydipsia and polyuria.  Genitourinary:  Negative for dysuria, frequency, genital sores, vaginal bleeding and vaginal discharge.  Musculoskeletal:  Negative for arthralgias, gait problem and joint swelling.  Skin:  Negative for color change and rash.  Neurological:  Negative for dizziness, tremors, light-headedness and headaches.  Hematological:  Negative for adenopathy. Does not bruise/bleed easily.  Psychiatric/Behavioral:  Positive for depression, dysphoric mood and sleep disturbance. Negative for suicidal ideas. The patient is nervous/anxious and has insomnia.     Patient Active Problem List   Diagnosis Date Noted   Anemia 11/08/2021   S/P hysterectomy 02/09/2020   Moderate episode of recurrent major depressive disorder (HCC) 02/09/2020   Neuropathy 07/30/2019   Osteopenia determined by x-ray 04/26/2019   Hyperlipidemia associated with type 2 diabetes mellitus (HCC) 07/15/2017   Type II diabetes mellitus with complication (HCC) 04/18/2017   Vitamin D deficiency 04/18/2017   Sleep apnea 02/28/2015   Obesity 02/28/2015   Hemorrhoids 03/04/2012   Insomnia 03/04/2012   Arthralgia 03/04/2012   Migraine without aura or status migrainosus 03/04/2012   Arthralgia of right temporomandibular joint 03/04/2012   Essential hypertension 11/08/2010    Allergies  Allergen Reactions   Amlodipine Swelling    And headache    Past Surgical History:  Procedure Laterality Date   ABDOMINAL HYSTERECTOMY  2008   for Menorrhagia   BREAST REDUCTION SURGERY      COLONOSCOPY     LAPAROSCOPY     OPEN REDUCTION INTERNAL FIXATION (ORIF) DISTAL RADIAL FRACTURE Right 10/12/2020   Procedure: OPEN REDUCTION INTERNAL FIXATION (ORIF) DISTAL RADIAL FRACTURE;  Surgeon: Kennedy Bucker, MD;  Location: [facility] ORS;  Service: Orthopedics;  Laterality: Right;   REDUCTION MAMMAPLASTY Bilateral 1991    Social History   Tobacco Use   Smoking status: Never   Smokeless tobacco: Never  Vaping Use   Vaping Use: Never used  Substance Use Topics   Alcohol use: Not Currently    Alcohol/week: 2.0 standard drinks of alcohol    Types: 2 Glasses of wine per week   Drug use: Never     Medication list has been reviewed and updated.  Current Meds  Medication Sig   dapagliflozin propanediol (FARXIGA) 10 MG TABS tablet Take 1 tablet (10 mg total) by mouth daily before breakfast.   glucose blood (ONETOUCH ULTRA) test strip Use to test blood sugar twice a day   naproxen (NAPROSYN) 250 MG tablet Take 250 mg by mouth 2 (two) times daily with a meal.   vitamin B-12 (CYANOCOBALAMIN) 1000 MCG tablet Take 1,000 mcg by mouth daily.   zolpidem (AMBIEN) 10 MG tablet Take 1 tablet (10 mg total) by mouth at bedtime as needed for sleep.   [DISCONTINUED] cholecalciferol (VITAMIN D3) 25 MCG (1000 UNIT) tablet Take 2,000 Units by  mouth daily.   [DISCONTINUED] diazepam (VALIUM) 5 MG tablet Take 1 tablet (5 mg total) by mouth every 6 (six) hours as needed for anxiety.   [DISCONTINUED] DULoxetine (CYMBALTA) 60 MG capsule Take 2 capsules (120 mg total) by mouth daily.   [DISCONTINUED] famotidine (PEPCID) 20 MG tablet Take 20 mg by mouth 2 (two) times daily.   [DISCONTINUED] losartan-hydrochlorothiazide (HYZAAR) 100-12.5 MG tablet Take 1 tablet by mouth daily.   [DISCONTINUED] nebivolol (BYSTOLIC) 5 MG tablet Take 1 tablet (5 mg total) by mouth daily.   [DISCONTINUED] pravastatin (PRAVACHOL) 10 MG tablet Take 1 tablet (10 mg total) by mouth 2 (two) times a week.       06/06/2022   10:43 AM  04/18/2022    9:06 AM 04/08/2022   10:27 AM 10/26/2021    2:05 PM  GAD 7 : Generalized Anxiety Score  Nervous, Anxious, on Edge 3 1 2 2   Control/stop worrying 3 1 2 1   Worry too much - different things 3 0 2 1  Trouble relaxing 3 1 2 1   Restless 0 0 0 1  Easily annoyed or irritable 3 1 2  0  Afraid - awful might happen 2 0 1 0  Total GAD 7 Score 17 4 11 6   Anxiety Difficulty  Somewhat difficult Extremely difficult Somewhat difficult       06/06/2022   10:43 AM 05/30/2022    9:42 AM 04/18/2022    9:05 AM  Depression screen PHQ 2/9  Decreased Interest 3 1 1   Down, Depressed, Hopeless 3 1 1   PHQ - 2 Score 6 2 2   Altered sleeping 3 1 2   Tired, decreased energy 3 1 3   Change in appetite 3 0 2  Feeling bad or failure about yourself  3 0 1  Trouble concentrating 3 0 1  Moving slowly or fidgety/restless 3 0 0  Suicidal thoughts 0 0 0  PHQ-9 Score 24 4 11   Difficult doing work/chores Very difficult Not difficult at all Very difficult    BP Readings from Last 3 Encounters:  06/06/22 128/70  04/18/22 122/72  04/08/22 (!) 175/96    Physical Exam Vitals and nursing note reviewed.  Constitutional:      General: She is not in acute distress.    Appearance: She is well-developed.  HENT:     Head: Normocephalic and atraumatic.     Right Ear: Tympanic membrane and ear canal normal.     Left Ear: Tympanic membrane and ear canal normal.     Nose:     Right Sinus: No maxillary sinus tenderness.     Left Sinus: No maxillary sinus tenderness.  Eyes:     General: No scleral icterus.       Right eye: No discharge.        Left eye: No discharge.     Conjunctiva/sclera: Conjunctivae normal.  Neck:     Thyroid: No thyromegaly.     Vascular: No carotid bruit.  Cardiovascular:     Rate and Rhythm: Normal rate and regular rhythm.     Pulses: Normal pulses.     Heart sounds: Normal heart sounds.  Pulmonary:     Effort: Pulmonary effort is normal. No respiratory distress.     Breath sounds:  No wheezing.  Chest:  Breasts:    Right: No mass, nipple discharge, skin change or tenderness.     Left: No mass, nipple discharge, skin change or tenderness.  Abdominal:     General: Bowel sounds are  normal.     Palpations: Abdomen is soft.     Tenderness: There is no abdominal tenderness.  Musculoskeletal:     Cervical back: Normal range of motion. No erythema.     Right lower leg: No edema.     Left lower leg: No edema.  Lymphadenopathy:     Cervical: No cervical adenopathy.  Skin:    General: Skin is warm and dry.     Capillary Refill: Capillary refill takes less than 2 seconds.     Findings: No rash.  Neurological:     General: No focal deficit present.     Mental Status: She is alert and oriented to person, place, and time.     Cranial Nerves: No cranial nerve deficit.     Sensory: No sensory deficit.     Deep Tendon Reflexes: Reflexes are normal and symmetric.  Psychiatric:        Attention and Perception: Attention normal.        Mood and Affect: Mood normal. Affect is tearful.        Speech: Speech normal.        Thought Content: Thought content normal.        Cognition and Memory: Cognition normal.    Diabetic Foot Exam - Simple   Simple Foot Form Diabetic Foot exam was performed with the following findings: Yes 06/06/2022 11:04 AM  Visual Inspection No deformities, no ulcerations, no other skin breakdown bilaterally: Yes Sensation Testing Intact to touch and monofilament testing bilaterally: Yes Pulse Check Posterior Tibialis and Dorsalis pulse intact bilaterally: Yes Comments      Wt Readings from Last 3 Encounters:  06/06/22 191 lb (86.6 kg)  05/30/22 193 lb (87.5 kg)  04/18/22 193 lb 6.4 oz (87.7 kg)    BP 128/70   Pulse 62   Ht 5' 4.5" (1.638 m)   Wt 191 lb (86.6 kg)   SpO2 97%   BMI 32.28 kg/m   Assessment and Plan:  Problem List Items Addressed This Visit       Cardiovascular and Mediastinum   Essential hypertension (Chronic)     Stable exam with well controlled BP.  Currently taking losartan, hctz and bystolic. Tolerating medications without concerns or side effects. Will continue to recommend low sodium diet and current regimen.       Relevant Medications   losartan-hydrochlorothiazide (HYZAAR) 100-12.5 MG tablet   pravastatin (PRAVACHOL) 10 MG tablet   nebivolol (BYSTOLIC) 5 MG tablet   Other Relevant Orders   CBC with Differential/Platelet   Comprehensive metabolic panel   Cortisol   Iron, TIBC and Ferritin Panel   Thyroid Panel With TSH     Respiratory   Sleep apnea (Chronic)    Never able to wear the face mask. No recent testing. Symptoms appear to be worsening with fatigue, somnolence and depression. Will refer to ENT to explore other treatment options      Relevant Orders   Ambulatory referral to ENT     Endocrine   Hyperlipidemia associated with type 2 diabetes mellitus (HCC)    Cholesterol elevated - statin started last visit and tolerating well Pravachol 10 mg biw - will continue      Relevant Medications   losartan-hydrochlorothiazide (HYZAAR) 100-12.5 MG tablet   pravastatin (PRAVACHOL) 10 MG tablet   nebivolol (BYSTOLIC) 5 MG tablet   Other Relevant Orders   Lipid panel   Type II diabetes mellitus with complication (HCC) (Chronic)    Blood sugars stable without hypoglycemic  symptoms or events. Currently being treated with [place]. Ozempic sample given last visit caused nausea and constipation. She is willing to try 0.25 mg ozempic without dose increase, esp if A1C has improved. Lab Results  Component Value Date   HGBA1C 7.8 (A) 04/18/2022         Relevant Medications   losartan-hydrochlorothiazide (HYZAAR) 100-12.5 MG tablet   pravastatin (PRAVACHOL) 10 MG tablet   Other Relevant Orders   Comprehensive metabolic panel   Hemoglobin A1c   Microalbumin / creatinine urine ratio   Vitamin B12     Other   Vitamin D deficiency    Level low in 2019 and 2022 DEXA shows  osteopenia. Will recheck level today.      Relevant Orders   VITAMIN D 25 Hydroxy (Vit-D Deficiency, Fractures)   Moderate episode of recurrent major depressive disorder (HCC) (Chronic)    Clinically stable on current regimen with good control of symptoms, No SI or HI. No change in current therapy recommended.       Relevant Medications   DULoxetine (CYMBALTA) 60 MG capsule   diazepam (VALIUM) 5 MG tablet   Other Relevant Orders   Thyroid Panel With TSH   Other Visit Diagnoses     Annual physical exam    -  Primary   mammogram done recently Colonoscopy scheduled   Mixed hyperlipidemia  (Chronic)      Relevant Medications   losartan-hydrochlorothiazide (HYZAAR) 100-12.5 MG tablet   pravastatin (PRAVACHOL) 10 MG tablet   nebivolol (BYSTOLIC) 5 MG tablet   Iron deficiency       Gastroesophageal reflux disease without esophagitis       Relevant Medications   famotidine (PEPCID) 20 MG tablet       Return in about 4 months (around 10/07/2022) for DM, HTN, Depression.   Partially dictated using Dragon software, any errors are not intentional.  Reubin Milan, MD [facility] [place], [place]

## 2022-06-07 LAB — CBC WITH DIFFERENTIAL/PLATELET
Basophils Absolute: 0.1 10*3/uL (ref 0.0–0.2)
Basos: 1 %
EOS (ABSOLUTE): 0.1 10*3/uL (ref 0.0–0.4)
Eos: 1 %
Hematocrit: 41.3 % (ref 34.0–46.6)
Hemoglobin: 12.5 g/dL (ref 11.1–15.9)
Immature Grans (Abs): 0 10*3/uL (ref 0.0–0.1)
Immature Granulocytes: 0 %
Lymphocytes Absolute: 1.6 10*3/uL (ref 0.7–3.1)
Lymphs: 20 %
MCH: 24.9 pg — ABNORMAL LOW (ref 26.6–33.0)
MCHC: 30.3 g/dL — ABNORMAL LOW (ref 31.5–35.7)
MCV: 82 fL (ref 79–97)
Monocytes Absolute: 0.6 10*3/uL (ref 0.1–0.9)
Monocytes: 7 %
Neutrophils Absolute: 5.6 10*3/uL (ref 1.4–7.0)
Neutrophils: 71 %
Platelets: 325 10*3/uL (ref 150–450)
RBC: 5.03 x10E6/uL (ref 3.77–5.28)
RDW: 16.2 % — ABNORMAL HIGH (ref 11.7–15.4)
WBC: 7.9 10*3/uL (ref 3.4–10.8)

## 2022-06-07 LAB — COMPREHENSIVE METABOLIC PANEL
ALT: 40 IU/L — ABNORMAL HIGH (ref 0–32)
AST: 37 IU/L (ref 0–40)
Albumin/Globulin Ratio: 2 (ref 1.2–2.2)
Albumin: 4.5 g/dL (ref 3.9–4.9)
Alkaline Phosphatase: 135 IU/L — ABNORMAL HIGH (ref 44–121)
BUN/Creatinine Ratio: 27 (ref 12–28)
BUN: 18 mg/dL (ref 8–27)
Bilirubin Total: 0.4 mg/dL (ref 0.0–1.2)
CO2: 22 mmol/L (ref 20–29)
Calcium: 10 mg/dL (ref 8.7–10.3)
Chloride: 101 mmol/L (ref 96–106)
Creatinine, Ser: 0.67 mg/dL (ref 0.57–1.00)
Globulin, Total: 2.3 g/dL (ref 1.5–4.5)
Glucose: 119 mg/dL — ABNORMAL HIGH (ref 70–99)
Potassium: 4.5 mmol/L (ref 3.5–5.2)
Sodium: 141 mmol/L (ref 134–144)
Total Protein: 6.8 g/dL (ref 6.0–8.5)
eGFR: 96 mL/min/{1.73_m2} (ref >59)

## 2022-06-07 LAB — VITAMIN D 25 HYDROXY (VIT D DEFICIENCY, FRACTURES): Vit D, 25-Hydroxy: 30.2 ng/mL (ref 30.0–100.0)

## 2022-06-07 LAB — MICROALBUMIN / CREATININE URINE RATIO
Creatinine, Urine: 78.3 mg/dL
Microalb/Creat Ratio: 8 mg/g creat (ref 0–29)
Microalbumin, Urine: 6.2 ug/mL

## 2022-06-07 LAB — HEMOGLOBIN A1C
Est. average glucose Bld gHb Est-mCnc: 157 mg/dL
Hgb A1c MFr Bld: 7.1 % — ABNORMAL HIGH (ref 4.8–5.6)

## 2022-06-07 LAB — LIPID PANEL
Chol/HDL Ratio: 4.1 ratio (ref 0.0–4.4)
Cholesterol, Total: 203 mg/dL — ABNORMAL HIGH (ref 100–199)
HDL: 50 mg/dL (ref >39)
LDL Chol Calc (NIH): 124 mg/dL — ABNORMAL HIGH (ref 0–99)
Triglycerides: 165 mg/dL — ABNORMAL HIGH (ref 0–149)
VLDL Cholesterol Cal: 29 mg/dL (ref 5–40)

## 2022-06-07 LAB — THYROID PANEL WITH TSH
Free Thyroxine Index: 1.7 (ref 1.2–4.9)
T3 Uptake Ratio: 24 % (ref 24–39)
T4, Total: 7 ug/dL (ref 4.5–12.0)
TSH: 1.46 u[IU]/mL (ref 0.450–4.500)

## 2022-06-07 LAB — CORTISOL: Cortisol: 7.1 ug/dL (ref 6.2–19.4)

## 2022-06-07 LAB — VITAMIN B12: Vitamin B-12: 780 pg/mL (ref 232–1245)

## 2022-06-10 ENCOUNTER — Encounter: Payer: Self-pay | Admitting: Internal Medicine

## 2022-06-13 ENCOUNTER — Encounter: Payer: Self-pay | Admitting: Gastroenterology

## 2022-06-17 ENCOUNTER — Other Ambulatory Visit: Payer: Self-pay | Admitting: *Deleted

## 2022-06-17 MED ORDER — NA SULFATE-K SULFATE-MG SULF 17.5-3.13-1.6 GM/177ML PO SOLN: 1.0000 | Freq: Once | ORAL | 0 refills | Status: AC

## 2022-06-19 ENCOUNTER — Encounter: Payer: Self-pay | Admitting: Gastroenterology

## 2022-06-20 ENCOUNTER — Ambulatory Visit
Admission: RE | Admit: 2022-06-20 | Discharge: 2022-06-20 | Disposition: A | Discharge status: Home / Self Care | Payer: PPO | Attending: Gastroenterology | Admitting: Gastroenterology

## 2022-06-20 ENCOUNTER — Ambulatory Visit: Payer: PPO | Admitting: Anesthesiology

## 2022-06-20 ENCOUNTER — Encounter: Payer: Self-pay | Admitting: Gastroenterology

## 2022-06-20 ENCOUNTER — Encounter
Admission: RE | Disposition: A | Discharge status: Home / Self Care | Payer: Self-pay | Source: Home / Self Care | Attending: Gastroenterology

## 2022-06-20 DIAGNOSIS — G4730 Sleep apnea, unspecified: Secondary | ICD-10-CM | POA: Diagnosis not present

## 2022-06-20 DIAGNOSIS — D120 Benign neoplasm of cecum: Secondary | ICD-10-CM | POA: Insufficient documentation

## 2022-06-20 DIAGNOSIS — K219 Gastro-esophageal reflux disease without esophagitis: Secondary | ICD-10-CM | POA: Diagnosis not present

## 2022-06-20 DIAGNOSIS — Z6831 Body mass index (BMI) 31.0-31.9, adult: Secondary | ICD-10-CM | POA: Diagnosis not present

## 2022-06-20 DIAGNOSIS — F419 Anxiety disorder, unspecified: Secondary | ICD-10-CM | POA: Insufficient documentation

## 2022-06-20 DIAGNOSIS — I10 Essential (primary) hypertension: Secondary | ICD-10-CM | POA: Insufficient documentation

## 2022-06-20 DIAGNOSIS — E119 Type 2 diabetes mellitus without complications: Secondary | ICD-10-CM | POA: Diagnosis not present

## 2022-06-20 DIAGNOSIS — D126 Benign neoplasm of colon, unspecified: Secondary | ICD-10-CM | POA: Diagnosis not present

## 2022-06-20 DIAGNOSIS — K635 Polyp of colon: Secondary | ICD-10-CM | POA: Diagnosis not present

## 2022-06-20 DIAGNOSIS — Z1211 Encounter for screening for malignant neoplasm of colon: Secondary | ICD-10-CM | POA: Diagnosis not present

## 2022-06-20 DIAGNOSIS — F32A Depression, unspecified: Secondary | ICD-10-CM | POA: Insufficient documentation

## 2022-06-20 DIAGNOSIS — Z79899 Other long term (current) drug therapy: Secondary | ICD-10-CM | POA: Diagnosis not present

## 2022-06-20 DIAGNOSIS — Z7689 Persons encountering health services in other specified circumstances: Secondary | ICD-10-CM | POA: Diagnosis not present

## 2022-06-20 HISTORY — PX: COLONOSCOPY WITH PROPOFOL: SHX5780

## 2022-06-20 LAB — GLUCOSE, CAPILLARY: Glucose-Capillary: 155 mg/dL — ABNORMAL HIGH (ref 70–99)

## 2022-06-20 SURGERY — COLONOSCOPY WITH PROPOFOL
Anesthesia: General

## 2022-06-20 MED ORDER — PROPOFOL 500 MG/50ML IV EMUL
INTRAVENOUS | Status: DC | PRN
  Administered 2022-06-20: 165 ug/kg/min via INTRAVENOUS

## 2022-06-20 MED ORDER — SODIUM CHLORIDE 0.9 % IV SOLN: INTRAVENOUS | Status: DC

## 2022-06-20 MED ORDER — PROPOFOL 10 MG/ML IV BOLUS
INTRAVENOUS | Status: DC | PRN
  Administered 2022-06-20 (×2): 20 mg via INTRAVENOUS
  Administered 2022-06-20: 30 mg via INTRAVENOUS
  Administered 2022-06-20: 60 mg via INTRAVENOUS

## 2022-06-20 MED ORDER — LIDOCAINE HCL (CARDIAC) PF 100 MG/5ML IV SOSY
PREFILLED_SYRINGE | INTRAVENOUS | Status: DC | PRN
  Administered 2022-06-20: 100 mg via INTRAVENOUS

## 2022-06-20 NOTE — Anesthesia Preprocedure Evaluation (Signed)
Anesthesia Evaluation  Patient identified by MRN, date of birth, ID band Patient awake    Reviewed: Allergy & Precautions, NPO status , Patient's Chart, lab work & pertinent test results  History of Anesthesia Complications (+) PONV and history of anesthetic complications  Airway Mallampati: III  TM Distance: >3 FB Neck ROM: full    Dental  (+) Partial Upper   Pulmonary neg pulmonary ROS, sleep apnea    Pulmonary exam normal breath sounds clear to auscultation       Cardiovascular Exercise Tolerance: Good hypertension, Pt. on medications negative cardio ROS Normal cardiovascular exam Rhythm:Regular Rate:Normal     Neuro/Psych  Headaches PSYCHIATRIC DISORDERS Anxiety Depression    negative neurological ROS  negative psych ROS   GI/Hepatic negative GI ROS, Neg liver ROS,GERD  Medicated,,  Endo/Other  negative endocrine ROSdiabetes, Type 2  Morbid obesity  Renal/GU negative Renal ROS  negative genitourinary   Musculoskeletal  (+) Arthritis ,    Abdominal  (+) + obese  Peds negative pediatric ROS (+)  Hematology negative hematology ROS (+) Blood dyscrasia, anemia   Anesthesia Other Findings Past Medical History: No date: Anemia No date: Anxiety No date: Arthritis No date: Complication of anesthesia No date: Depression No date: Diabetes mellitus without complication (HCC) No date: GERD (gastroesophageal reflux disease) No date: Headache No date: Hypertension No date: PONV (postoperative nausea and vomiting)  Past Surgical History: 2008: ABDOMINAL HYSTERECTOMY     Comment:  for Menorrhagia No date: BREAST REDUCTION SURGERY No date: COLONOSCOPY No date: LAPAROSCOPY 10/12/2020: OPEN REDUCTION INTERNAL FIXATION (ORIF) DISTAL RADIAL  FRACTURE; Right     Comment:  Procedure: OPEN REDUCTION INTERNAL FIXATION (ORIF)               DISTAL RADIAL FRACTURE;  Surgeon: Kennedy Bucker, MD;                Location: [facility]  ORS;  Service: Orthopedics;  Laterality:               Right; 1991: REDUCTION MAMMAPLASTY; Bilateral  BMI    Body Mass Index: 31.24 kg/m      Reproductive/Obstetrics negative OB ROS                             Anesthesia Physical Anesthesia Plan  ASA: 3  Anesthesia Plan: General   Post-op Pain Management:    Induction: Intravenous  PONV Risk Score and Plan: Propofol infusion and TIVA  Airway Management Planned: Natural Airway  Additional Equipment:   Intra-op Plan:   Post-operative Plan:   Informed Consent: I have reviewed the patients History and Physical, chart, labs and discussed the procedure including the risks, benefits and alternatives for the proposed anesthesia with the patient or authorized representative who has indicated his/her understanding and acceptance.     Dental Advisory Given  Plan Discussed with: CRNA and Surgeon  Anesthesia Plan Comments:        Anesthesia Quick Evaluation

## 2022-06-20 NOTE — Transfer of Care (Signed)
Immediate Anesthesia Transfer of Care Note  Patient: [name]  Procedure(s) Performed: COLONOSCOPY WITH PROPOFOL  Patient Location: Endoscopy Unit  Anesthesia Type:General  Level of Consciousness: drowsy and patient cooperative  Airway & Oxygen Therapy: Patient Spontanous Breathing and Patient connected to face mask oxygen  Post-op Assessment: Report given to RN and Post -op Vital signs reviewed and stable  Post vital signs: Reviewed and stable  Last Vitals:  Vitals Value Taken Time  BP 99/66 06/20/22 0835  Temp 36.1 C 06/20/22 0833  Pulse 80 06/20/22 0838  Resp 16 06/20/22 0838  SpO2 99 % 06/20/22 0838  Vitals shown include unvalidated device data.  Last Pain:  Vitals:   06/20/22 0833  TempSrc: Temporal  PainSc:          Complications: No notable events documented.

## 2022-06-20 NOTE — H&P (Signed)
[name], MD [address], [address], [place], [place], [address] [address], [address], [place], [place], [address] Phone: [phone]  Fax: [phone]  Primary Care Physician:  Reubin Milan, MD   Pre-Procedure History & Physical: HPI:  [name] is a 67 y.o. female is here for an colonoscopy.   Past Medical History:  Diagnosis Date   Anemia    Anxiety    Arthritis    Complication of anesthesia    Depression    Diabetes mellitus without complication (HCC)    GERD (gastroesophageal reflux disease)    Headache    Hypertension    PONV (postoperative nausea and vomiting)     Past Surgical History:  Procedure Laterality Date   ABDOMINAL HYSTERECTOMY  2008   for Menorrhagia   BREAST REDUCTION SURGERY     COLONOSCOPY     LAPAROSCOPY     OPEN REDUCTION INTERNAL FIXATION (ORIF) DISTAL RADIAL FRACTURE Right 10/12/2020   Procedure: OPEN REDUCTION INTERNAL FIXATION (ORIF) DISTAL RADIAL FRACTURE;  Surgeon: Kennedy Bucker, MD;  Location: [facility] ORS;  Service: Orthopedics;  Laterality: Right;   REDUCTION MAMMAPLASTY Bilateral 1991    Prior to Admission medications   Medication Sig Start Date End Date Taking? Authorizing Provider  DULoxetine (CYMBALTA) 60 MG capsule Take 2 capsules (120 mg total) by mouth daily. 06/06/22 09/04/22 Yes Reubin Milan, MD  famotidine (PEPCID) 20 MG tablet Take 1 tablet (20 mg total) by mouth 2 (two) times daily. 06/06/22  Yes Reubin Milan, MD  losartan-hydrochlorothiazide (HYZAAR) 100-12.5 MG tablet Take 1 tablet by mouth daily. 06/06/22  Yes Reubin Milan, MD  pravastatin (PRAVACHOL) 10 MG tablet Take 1 tablet (10 mg total) by mouth 2 (two) times a week. 06/06/22  Yes Reubin Milan, MD  vitamin B-12 (CYANOCOBALAMIN) 1000 MCG tablet Take 1,000 mcg by mouth daily.   Yes [provider]  dapagliflozin propanediol (FARXIGA) 10 MG TABS tablet Take 1 tablet (10 mg total) by mouth daily before breakfast. 04/18/22   Reubin Milan, MD  diazepam (VALIUM) 5 MG tablet Take 1 tablet (5 mg total) by mouth every 6 (six) hours as needed for anxiety. 06/06/22   Reubin Milan, MD  glucose blood (ONETOUCH ULTRA) test strip Use to test blood sugar twice a day 02/15/20   Reubin Milan, MD  naproxen (NAPROSYN) 250 MG tablet Take 250 mg by mouth 2 (two) times daily with a meal.    [provider]  nebivolol (BYSTOLIC) 5 MG tablet Take 1 tablet (5 mg total) by mouth daily. 06/06/22   Reubin Milan, MD  zolpidem (AMBIEN) 10 MG tablet Take 1 tablet (10 mg total) by mouth at bedtime as needed for sleep. 04/18/22   Reubin Milan, MD    Allergies as of 06/04/2022 - Review Complete 05/30/2022  Allergen Reaction Noted   Amlodipine Swelling 02/12/2017    Family History  Problem Relation Age of Onset   Diabetes Daughter    Hypertension Daughter    Breast cancer Maternal Aunt    Diabetes Maternal Uncle     Social History   Socioeconomic History   Marital status: Widowed    Spouse name: Not on file   Number of children: 1   Years of education: Not on file   Highest education level: Not on file  Occupational History   Not on file  Tobacco Use   Smoking status: Never   Smokeless tobacco: Never  Vaping Use   Vaping Use: Never used  Substance and Sexual Activity   Alcohol use: Not Currently    Alcohol/week: 2.0 standard drinks of alcohol    Types: 2 Glasses of wine per week   Drug use: Never   Sexual activity: Not Currently    Partners: Male    Birth control/protection: Surgical  Other Topics Concern   Not on file  Social History Narrative   ** Merged History Encounter **       Social Determinants of Health   Financial Resource Strain: Low Risk  (05/30/2022)   Overall Financial Resource Strain (CARDIA)    Difficulty of Paying Living Expenses: Not very hard  Food Insecurity: No Food Insecurity (05/30/2022)   Hunger Vital Sign    Worried About Running Out of Food in the Last Year: Never true     Ran Out of Food in the Last Year: Never true  Transportation Needs: No Transportation Needs (05/30/2022)   PRAPARE - Administrator, Civil Service (Medical): No    Lack of Transportation (Non-Medical): No  Physical Activity: Insufficiently Active (05/30/2022)   Exercise Vital Sign    Days of Exercise per Week: 3 days    Minutes of Exercise per Session: 30 min  Stress: No Stress Concern Present (05/30/2022)   [employer] of Occupational Health - Occupational Stress Questionnaire    Feeling of Stress : Only a little  Social Connections: Socially Isolated (05/30/2022)   Social Connection and Isolation Panel [NHANES]    Frequency of Communication with Friends and Family: Twice a week    Frequency of Social Gatherings with Friends and Family: Twice a week    Attends Religious Services: Never    Database administrator or Organizations: No    Attends Banker Meetings: Never    Marital Status: Widowed  Intimate Partner Violence: Not At Risk (05/30/2022)   Humiliation, Afraid, Rape, and Kick questionnaire    Fear of Current or Ex-Partner: No    Emotionally Abused: No    Physically Abused: No    Sexually Abused: No    Review of Systems: See HPI, otherwise negative ROS  Physical Exam: BP 127/84   Pulse 75   Temp (!) 97.4 F (36.3 C) (Temporal)   Resp 16   Ht 5' 5.5" (1.664 m)   Wt 86.5 kg   SpO2 98%   BMI 31.24 kg/m  General:   Alert,  pleasant and cooperative in NAD Head:  Normocephalic and atraumatic. Neck:  Supple; no masses or thyromegaly. Lungs:  Clear throughout to auscultation, normal respiratory effort.    Heart:  +S1, +S2, Regular rate and rhythm, No edema. Abdomen:  Soft, nontender and nondistended. Normal bowel sounds, without guarding, and without rebound.   Neurologic:  Alert and  oriented x4;  grossly normal neurologically.  Impression/Plan: [name] is here for an colonoscopy to be performed for Screening colonoscopy average risk    Risks, benefits, limitations, and alternatives regarding  colonoscopy have been reviewed with the patient.  Questions have been answered.  All parties agreeable.   [name], MD  06/20/2022, 7:37 AM

## 2022-06-20 NOTE — OR Nursing (Signed)
One Polyp of the 2 removed from the Sigmoid Colon was not retrieved.

## 2022-06-20 NOTE — Op Note (Signed)
[facility] Patient Name: [name] Procedure Date: 06/20/2022 8:01 AM MRN: [id number] Account #: [id number] Date of Birth: 06-13-1955 Admit Type: Outpatient Age: 67 Room: [facility] ENDO ROOM 3 Gender: Female Note Status: Finalized Instrument Name: Prentice Docker [id number] Procedure:             Colonoscopy Indications:           Screening for colorectal malignant neoplasm Providers:             [name] MD, MD Referring MD:          Bari Edward, MD (Referring MD) Medicines:             Monitored Anesthesia Care Complications:         No immediate complications. Procedure:             Pre-Anesthesia Assessment:                        - Prior to the procedure, a History and Physical was                         performed, and patient medications, allergies and                         sensitivities were reviewed. The patient's tolerance                         of previous anesthesia was reviewed.                        - The risks and benefits of the procedure and the                         sedation options and risks were discussed with the                         patient. All questions were answered and informed                         consent was obtained.                        - ASA Grade Assessment:                        - After reviewing the risks and benefits, the patient                         was deemed in satisfactory condition to undergo the                         procedure.                        After obtaining informed consent, the colonoscope was                         passed under direct vision. Throughout the procedure,                         the patient's blood pressure, pulse, and  oxygen                         saturations were monitored continuously. The                         Colonoscope was introduced through the anus and                         advanced to the the cecum, identified by the                          appendiceal orifice. The colonoscopy was performed                         with ease. The patient tolerated the procedure well.                         The quality of the bowel preparation was excellent.                         The ileocecal valve, appendiceal orifice, and rectum                         were photographed. Findings:      The perianal and digital rectal examinations were normal.      A 3 mm polyp was found in the cecum. The polyp was sessile. The polyp       was removed with a cold biopsy forceps. Resection and retrieval were       complete.      An 8 mm polyp was found in the ascending colon. The polyp was sessile.       The polyp was removed with a cold snare. Resection and retrieval were       complete.      Two sessile polyps were found in the sigmoid colon. The polyps were 4 to       5 mm in size. These polyps were removed with a cold snare. Resection was       complete, but the polyp tissue was only partially retrieved.      The exam was otherwise without abnormality on direct and retroflexion       views. Impression:            - One 3 mm polyp in the cecum, removed with a cold                         biopsy forceps. Resected and retrieved.                        - One 8 mm polyp in the ascending colon, removed with                         a cold snare. Resected and retrieved.                        - Two 4 to 5 mm polyps in the sigmoid colon, removed                         with  a cold snare. Complete resection. Partial                         retrieval.                        - The examination was otherwise normal on direct and                         retroflexion views. Recommendation:        - Discharge patient to home (with escort).                        - Resume previous diet.                        - Continue present medications.                        - Await pathology results.                        - Repeat colonoscopy in 3 years for surveillance based                          on pathology results. Procedure Code(s):     --- Professional ---                        [phone], Colonoscopy, flexible; with removal of                         tumor(s), polyp(s), or other lesion(s) by snare                         technique                        45380, 59, Colonoscopy, flexible; with biopsy, single                         or multiple Diagnosis Code(s):     --- Professional ---                        Z12.11, Encounter for screening for malignant neoplasm                         of colon                        D12.0, Benign neoplasm of cecum                        D12.2, Benign neoplasm of ascending colon                        D12.5, Benign neoplasm of sigmoid colon CPT copyright 2022 American Medical Association. All rights reserved. The codes documented in this report are preliminary and upon coder review may  be revised to meet current compliance requirements. [name], MD [name] MD, MD 06/20/2022 8:30:38 AM This report has been signed electronically. Number of Addenda: 0 Note Initiated On: 06/20/2022  8:01 AM Scope Withdrawal Time: 0 hours 13 minutes 17 seconds  Total Procedure Duration: 0 hours 16 minutes 23 seconds  Estimated Blood Loss:  Estimated blood loss: none.      [facility]

## 2022-06-20 NOTE — Anesthesia Postprocedure Evaluation (Signed)
Anesthesia Post Note  Patient: [name]  Procedure(s) Performed: COLONOSCOPY WITH PROPOFOL  Patient location during evaluation: Short Stay Anesthesia Type: General Level of consciousness: awake Pain management: satisfactory to patient Vital Signs Assessment: post-procedure vital signs reviewed and stable Respiratory status: spontaneous breathing and respiratory function stable Cardiovascular status: blood pressure returned to baseline Anesthetic complications: no   No notable events documented.   Last Vitals:  Vitals:   06/20/22 0843 06/20/22 0853  BP: 103/65   Pulse:  71  Resp:    Temp:    SpO2:  100%    Last Pain:  Vitals:   06/20/22 0853  TempSrc:   PainSc: 0-No pain                 VAN STAVEREN,Tahje Borawski

## 2022-06-20 NOTE — Anesthesia Procedure Notes (Signed)
Procedure Name: General with mask airway Date/Time: 06/20/2022 8:11 AM  Performed by: Mohammed Kindle, CRNAPre-anesthesia Checklist: Patient identified, Emergency Drugs available, Suction available and Patient being monitored Patient Re-evaluated:Patient Re-evaluated prior to induction Oxygen Delivery Method: Simple face mask Induction Type: IV induction Placement Confirmation: positive ETCO2, CO2 detector and breath sounds checked- equal and bilateral Dental Injury: Teeth and Oropharynx as per pre-operative assessment

## 2022-06-21 ENCOUNTER — Encounter: Payer: Self-pay | Admitting: Gastroenterology

## 2022-07-08 ENCOUNTER — Other Ambulatory Visit: Payer: PPO | Admitting: Pharmacist

## 2022-07-08 NOTE — Patient Instructions (Signed)
Goals Addressed             This Visit's Progress    Pharmacy Goals       Our goal A1c is less than 7%. This corresponds with fasting sugars less than 130 and 2 hour after meal sugars less than 180. Please keep a log of your results when checking your blood sugar   Our goal bad cholesterol, or LDL, is less than 70. This is why it is important to continue taking your pravastatin  Please watch the mail for an envelope from [employer] containing the patient assistance program application. Please complete this application and mail back to [facility] Pharmacy Technician Noreene Larsson Simcox along with a copy of your Medicare Part D prescription card and a copy of your proof of income document.  Thank you!  Estelle Grumbles, PharmD, [facility] [phone]

## 2022-07-08 NOTE — Progress Notes (Signed)
07/08/2022 Name: [name] MRN: [id number] DOB: Feb 19, 1955  [name] is a 67 y.o. year old female who presented for a telephone visit.   They were referred to the pharmacist by a quality report for assistance in managing diabetes and medication access.   This patient is appearing on the insurance-provided list related to diabetes medications this calendar year.   Medication: Farxiga 10 mg Last fill date: 07/03/2022 for 30 day supply; from review of dispensing history, note gaps In 30 day supply refills  Subjective:  Care Team: Primary Care Provider: Reubin Milan, MD ; Next Scheduled Visit: 10/11/2022   Medication Access/Adherence  Current Pharmacy:  [facility] - [place], [place] - [address] [place] [address] Phone: [phone] Fax: [phone]  [facility] [address] [place] - [address] [place]) [place] [address] Phone: [phone] Fax: [phone]   Patient reports affordability concerns with their medications: Yes  Patient reports access/transportation concerns to their pharmacy: No  Patient reports adherence concerns with their medications:  No    Reports sometimes does not take her Marcelline Deist consistently. Reports that this medication is difficult to afford   Diabetes:  Current medications:  - Farxiga 10 mg daily before breakfast - Reports plans to start Ozempic 0.25 mg weekly on Mondays - starting today from sample as directed by PCP  Medications tried in the past: metformin (unable to tolerate)  Current glucose readings: morning fasting ranging: 136-180  *Patient admits to often snacking overnight   Current meal patterns:  - Breakfast: Egg on English muffin or piece of fruit  - Lunch: Sandwich - pimento cheese or Bologna or flavored [place] yogurt - Supper: Sandwich or baked potato or [employer] - Drink: Zero sugar lemonade and water - Snacks: Olives  or nuts or icy pop  Statin therapy: pravastatin 10 mg twice weekly  Current medication access support: none - Based on reported income, patient does not meet criteria for Extra Help subsidy from [employer]  - Based on reported income, patient meets criteria for Farxiga patient assistance from [place]   Objective:  Lab Results  Component Value Date   HGBA1C 7.1 (H) 06/06/2022    Lab Results  Component Value Date   CREATININE 0.67 06/06/2022   BUN 18 06/06/2022   NA 141 06/06/2022   K 4.5 06/06/2022   CL 101 06/06/2022   CO2 22 06/06/2022    Lab Results  Component Value Date   CHOL 203 (H) 06/06/2022   HDL 50 06/06/2022   LDLCALC 124 (H) 06/06/2022   LDLDIRECT 161.1 11/08/2010   TRIG 165 (H) 06/06/2022   CHOLHDL 4.1 06/06/2022    Medications Reviewed Today     Reviewed by Hennie Duos, RN (Registered Nurse) on 06/20/22 at [phone]  Med List Status: <None>   Medication Order Taking? Sig Documenting Provider Last Dose Status Informant  dapagliflozin propanediol (FARXIGA) 10 MG TABS tablet [id number] No Take 1 tablet (10 mg total) by mouth daily before breakfast. Reubin Milan, MD 06/16/2022 Active   diazepam (VALIUM) 5 MG tablet [id number]  Take 1 tablet (5 mg total) by mouth every 6 (six) hours as needed for anxiety. Reubin Milan, MD  Active   DULoxetine (CYMBALTA) 60 MG capsule [id number] Yes Take 2 capsules (120 mg total) by mouth daily. Reubin Milan, MD 06/19/2022 Active   famotidine (PEPCID) 20 MG tablet [id number] Yes Take  1 tablet (20 mg total) by mouth 2 (two) times daily. Reubin Milan, MD 06/19/2022 Active   glucose blood (ONETOUCH ULTRA) test strip [id number]  Use to test blood sugar twice a day Reubin Milan, MD  Active Self  losartan-hydrochlorothiazide [facility]) 100-12.5 MG tablet [id number] Yes Take 1 tablet by mouth daily. Reubin Milan, MD 06/19/2022 Active   naproxen (NAPROSYN) 250 MG tablet [id number]  Take 250 mg by mouth 2 (two) times  daily with a meal. [provider]  Active   nebivolol (BYSTOLIC) 5 MG tablet [id number]  Take 1 tablet (5 mg total) by mouth daily. Reubin Milan, MD  Active   pravastatin (PRAVACHOL) 10 MG tablet [id number] Yes Take 1 tablet (10 mg total) by mouth 2 (two) times a week. Reubin Milan, MD Past Week Active   vitamin B-12 (CYANOCOBALAMIN) 1000 MCG tablet [id number] Yes Take 1,000 mcg by mouth daily. [provider] Past Week Active Self  zolpidem (AMBIEN) 10 MG tablet [id number]  Take 1 tablet (10 mg total) by mouth at bedtime as needed for sleep. Reubin Milan, MD  Active               Assessment/Plan:   Diabetes: - Reviewed long term cardiovascular and renal outcomes of uncontrolled blood sugar - Reviewed goal A1c, goal fasting, and goal 2 hour post prandial glucose - Reviewed dietary modifications including encourage patient to have regular well-balanced meals and snacks, while controlling carbohydrate portion sizes Discuss ideas for lower sugar/carbohydrate balanced snacks Encourage patient to incorporate non-starchy vegetables in her meals Encourage patient to review nutrition labels for total carbohydrate content of foods - Counsel on strategies to aid with tolerability to Ozempic - Recommend to continue to check glucose, keep log of results and have this record to review for future medical appointments - Counsel patient on benefits of adherence to pravastatin/LDL control for ASCVD risk reduction - Meets financial criteria for [place] patient assistance program through [place]. Will collaborate with provider, [facility] CPhT, and patient to pursue assistance.  - If patient able to tolerate re-start of Ozempic, will discuss Ozempic patient assistance program further during our next call   Follow Up Plan: Clinical Pharmacist will follow up with patient by telephone again on 07/31/2022 at 11:30 AM   Estelle Grumbles, PharmD, [facility] [phone]

## 2022-07-11 ENCOUNTER — Telehealth: Payer: Self-pay | Admitting: Pharmacy Technician

## 2022-07-11 DIAGNOSIS — Z5986 Financial insecurity: Secondary | ICD-10-CM

## 2022-07-11 NOTE — Progress Notes (Signed)
[facility] Customer service manager [facility])                                            [facility] Quality Pharmacy Team    07/11/2022  YUVIA PLANT 25-Oct-1955 [id number]                                      Medication Assistance Referral  Referral From:  [facility] PharmD Estelle Grumbles  Medication/Company: Marcelline Deist / AZ[employer]ME Patient application portion:  Mailed Provider application portion: Faxed  to Dr. Judithann Graves Provider address/fax verified via: Office website   Trino Higinbotham P. Alastor Kneale, CPhT [facility] [employer]  [phone]

## 2022-07-17 DIAGNOSIS — G4733 Obstructive sleep apnea (adult) (pediatric): Secondary | ICD-10-CM | POA: Diagnosis not present

## 2022-07-26 ENCOUNTER — Telehealth: Payer: Self-pay | Admitting: Pharmacy Technician

## 2022-07-26 DIAGNOSIS — Z5986 Financial insecurity: Secondary | ICD-10-CM

## 2022-07-26 NOTE — Progress Notes (Signed)
[facility])                                            [facility] Quality Pharmacy Team    07/26/2022  [name] 02-22-1955 [id number]  Received both patient and provider portion(s) of patient assistance application(s) for Farxia. Faxed completed application and required documents into [employer]ME.    Pattricia Boss, CPhT [facility] Office: [phone] Fax: [phone] Email: [email or web address]

## 2022-07-31 ENCOUNTER — Other Ambulatory Visit: Payer: PPO | Admitting: Pharmacist

## 2022-07-31 ENCOUNTER — Telehealth: Payer: Self-pay | Admitting: Pharmacy Technician

## 2022-07-31 DIAGNOSIS — Z5986 Financial insecurity: Secondary | ICD-10-CM

## 2022-07-31 NOTE — Progress Notes (Signed)
[facility] Customer service manager [facility])                                            [facility] Quality Pharmacy Team    07/31/2022  [name] [birth year] [id number]  Care coordination call placed to [employer]ME in regard to [facility] application.  Spoke to [place] who informs patient is APPROVED 07/26/22-01/28/23. She informs initial and subsequent refills will process automatically and be shipped to the patient's home. However, if patient feels current supply is not sufficient until next supply, then patient may call AZ&ME at [phone].  Pattricia Boss, CPhT [place]  [facility] Office: [phone] Fax: [phone] Email: [email or web address]

## 2022-07-31 NOTE — Progress Notes (Addendum)
07/31/2022 Name: [name] MRN: [id number] DOB: 30-May-1955  Chief Complaint  Patient presents with   Medication Management   Medication Assistance    SHAINA Barber is a 67 y.o. year old female who presented for a telephone visit.   They were referred to the pharmacist by a quality report for assistance in managing diabetes and medication access.    Subjective:  Care Team: Primary Care Provider: Reubin Milan, MD ; Next Scheduled Visit: 10/11/2022  Medication Access/Adherence  Current Pharmacy:  [facility] - [place], [place] - [address] [place] [address] Phone: [phone] Fax: [phone]  [facility] [address] ([address]), [place] - [address] [place]) [place] [address] Phone: [phone] Fax: [phone]   Patient reports affordability concerns with their medications: Yes  Patient reports access/transportation concerns to their pharmacy: No  Patient reports adherence concerns with their medications:  No     Diabetes:   Current medications:  - Farxiga 10 mg daily before breakfast - Ozempic 0.25 mg weekly on Mondays (started on 6/10 from sample from PCP)   Reports having some mild nausea, but able to tolerate   Medications tried in the past: metformin (unable to tolerate)   Current glucose readings: morning fasting ranging: 150-229 **Note patient currently on a steroid taper related to recent dental procedure. Finishing taper today**   Reports has worked on reducing carbohydrate portion sizes, but admits to snacking on watermelon and cantaloupe as well as sometimes drinking 1-2 cans of regular ginger ale/day    Physical Activity: stays active throughout the day walking her dog at least twice day and maintaining garden everyday  Statin therapy: pravastatin 10 mg twice weekly   Current medication access support: none - Patient approved for patient assistance for Farxiga  from [employer] through 01/28/2023  Note medication expected to ship to patient within then next 10-14 business days  Today patient reports that she has only 1 week supply of Farxiga remaining     Objective:  Lab Results  Component Value Date   HGBA1C 7.1 (H) 06/06/2022    Lab Results  Component Value Date   CREATININE 0.67 06/06/2022   BUN 18 06/06/2022   NA 141 06/06/2022   K 4.5 06/06/2022   CL 101 06/06/2022   CO2 22 06/06/2022    Lab Results  Component Value Date   CHOL 203 (H) 06/06/2022   HDL 50 06/06/2022   LDLCALC 124 (H) 06/06/2022   LDLDIRECT 161.1 11/08/2010   TRIG 165 (H) 06/06/2022   CHOLHDL 4.1 06/06/2022    Medications Reviewed Today     Reviewed by [name], RPH-CPP (Pharmacist) on 07/08/22 at 1230  Med List Status: <None>   Medication Order Taking? Sig Documenting Provider Last Dose Status Informant  Cholecalciferol 25 MCG (1000 UT) TBDP [id number] Yes Take 1 tablet by mouth daily. [provider] Taking Active   dapagliflozin propanediol (FARXIGA) 10 MG TABS tablet [id number] Yes Take 1 tablet (10 mg total) by mouth daily before breakfast. Reubin Milan, MD Taking Active   diazepam (VALIUM) 5 MG tablet [id number]  Take 1 tablet (5 mg total) by mouth every 6 (six) hours as needed for anxiety. Reubin Milan, MD  Active   DULoxetine (CYMBALTA) 60 MG capsule [id number]  Take 2 capsules (120 mg total) by mouth daily. Reubin Milan, MD  Active   famotidine (PEPCID) 20 MG tablet [id number]  Take 1 tablet (20 mg total) by mouth 2 (two) times daily. Reubin Milan, MD  Active   glucose blood [facility] ULTRA) test strip [id number]  Use to test blood sugar twice a day Reubin Milan, MD  Active Self  losartan-hydrochlorothiazide [facility]) 100-12.5 MG tablet [id number]  Take 1 tablet by mouth daily. Reubin Milan, MD  Active   naproxen (NAPROSYN) 250 MG tablet [id number]  Take 250 mg by mouth 2 (two) times daily with a meal. [provider]  Active   nebivolol (BYSTOLIC) 5 MG tablet [id number]  Take 1 tablet (5 mg total) by mouth daily. Reubin Milan, MD  Active   pravastatin (PRAVACHOL) 10 MG tablet [id number] Yes Take 1 tablet (10 mg total) by mouth 2 (two) times a week. Reubin Milan, MD Taking Active   vitamin B-12 (CYANOCOBALAMIN) 1000 MCG tablet [id number]  Take 1,000 mcg by mouth daily. [provider]  Active Self  zolpidem (AMBIEN) 10 MG tablet [id number]  Take 1 tablet (10 mg total) by mouth at bedtime as needed for sleep. Reubin Milan, MD  Active               Assessment/Plan:   Diabetes: - Reviewed long term cardiovascular and renal outcomes of uncontrolled blood sugar - Reviewed goal A1c, goal fasting, and goal 2 hour post prandial glucose - Reviewed dietary modifications including encourage patient to have regular well-balanced meals and snacks, while controlling carbohydrate portion sizes Discuss ideas for lower sugar/carbohydrate balanced snacks Encourage patient to incorporate non-starchy vegetables in her meals Encourage patient to review nutrition labels for total carbohydrate content of foods Advise patient to avoid sugary beverages - Counsel on strategies to aid with tolerability to Ozempic - Collaborate with [employer] today to provide pharmacy with coupon for 30-day free trial of Farxiga for patient. Confirm no charge for prescription.  Follow up to let patient know - Recommend to continue to check glucose, keep log of results and have this record to review for future medical appointments - Counsel patient on benefits of adherence to pravastatin/LDL control for ASCVD risk reduction - Unable to pursue Ozempic patient assistance program through [employer] at this time as provider office unable to accept patient assistance medication deliver at this time and program will only ship to prescriber office - Patient would like to continue on Ozempic at this time  as covered by her insurance plan  Send message to PCP to request provider send Rx for Ozempic to pharmacy for patient     Follow Up Plan: Clinical Pharmacist will follow up with patient by telephone again on 7/29 at 10:30 am   Estelle Grumbles, PharmD, [facility] [phone]

## 2022-08-01 DIAGNOSIS — G4730 Sleep apnea, unspecified: Secondary | ICD-10-CM | POA: Diagnosis not present

## 2022-08-02 ENCOUNTER — Telehealth: Payer: Self-pay | Admitting: Pharmacist

## 2022-08-02 ENCOUNTER — Other Ambulatory Visit: Payer: Self-pay | Admitting: Internal Medicine

## 2022-08-02 DIAGNOSIS — E118 Type 2 diabetes mellitus with unspecified complications: Secondary | ICD-10-CM

## 2022-08-02 MED ORDER — OZEMPIC (0.25 OR 0.5 MG/DOSE) 2 MG/3ML ~~LOC~~ SOPN
0.2500 mg | PEN_INJECTOR | SUBCUTANEOUS | 1 refills | Status: DC
Start: 2022-08-02 — End: 2022-08-26

## 2022-08-02 NOTE — Progress Notes (Unsigned)
Dr. Judithann Graves,   I apologize if this is a duplicate message  Patient is tolerating the Ozempic 0.25 mg weekly well from the sample that you provided at office. However, copay for Ozempic is difficult to afford. Based on income, she will qualify for Ozempic from [employer] PAP. However, wanted to check in with you because [employer] only ships to office. Is office okay to receive this for patient?   Note, no alternative GLP-1 RAs available for new PAP enrollment that would ship to home.  Also, just FYI - patient was approved for Farxiga PAP.  Thank you!   Estelle Grumbles, PharmD, [facility]  [phone]

## 2022-08-05 NOTE — Patient Instructions (Signed)
Goals Addressed             This Visit's Progress    Pharmacy Goals       Our goal A1c is less than 7%. This corresponds with fasting sugars less than 130 and 2 hour after meal sugars less than 180. Please keep a log of your results when checking your blood sugar   Our goal bad cholesterol, or LDL, is less than 70. This is why it is important to continue taking your pravastatin   Thank you!  Estelle Grumbles, PharmD, [facility] [phone]

## 2022-08-06 ENCOUNTER — Telehealth: Payer: Self-pay

## 2022-08-06 NOTE — Telephone Encounter (Signed)
Completed PA on [email or web address] for Ozempic. (Key: [id number]) Rx #: [id number] Ozempic (0.25 or 0.5 MG/DOSE) 2MG /3ML pen-injectors  Form [employer] Electronic Prior Authorization Form 2017 NCPDP  PA approved until 07/07/2023.

## 2022-08-26 ENCOUNTER — Other Ambulatory Visit: Payer: PPO | Admitting: Pharmacist

## 2022-08-26 ENCOUNTER — Other Ambulatory Visit: Payer: Self-pay

## 2022-08-26 ENCOUNTER — Other Ambulatory Visit: Payer: Self-pay | Admitting: Internal Medicine

## 2022-08-26 DIAGNOSIS — E118 Type 2 diabetes mellitus with unspecified complications: Secondary | ICD-10-CM

## 2022-08-26 MED ORDER — PEN NEEDLES 31G X 5 MM MISC
1.0000 | 0 refills | Status: AC
Start: 2022-08-26 — End: ?
  Filled 2022-08-26: qty 30, fill #0

## 2022-08-26 MED ORDER — OZEMPIC (0.25 OR 0.5 MG/DOSE) 2 MG/3ML ~~LOC~~ SOPN
PEN_INJECTOR | SUBCUTANEOUS | 0 refills | Status: DC
Start: 2022-08-26 — End: 2022-09-06
  Filled 2022-08-26: qty 3, 42d supply, fill #0

## 2022-08-26 NOTE — Patient Instructions (Signed)
Goals Addressed             This Visit's Progress    Pharmacy Goals       Our goal A1c is less than 7%. This corresponds with fasting sugars less than 130 and 2 hour after meal sugars less than 180. Please keep a log of your results when checking your blood sugar   Our goal bad cholesterol, or LDL, is less than 70. This is why it is important to continue taking your pravastatin   Thank you!  Estelle Grumbles, PharmD, [facility] [phone]

## 2022-08-26 NOTE — Progress Notes (Signed)
Dr. Judithann Graves,   I have been collaborating with [facility] and found that there is an option for this patient to receive Ozempic PAP without it coming to the office, but instead being shipped to the [facility].   Would you mind sending prescriptions for both patient's current Ozempic Rx as well as a prescription for pen needles to [facility] for her?  Thank you!  Estelle Grumbles, PharmD, [facility] [phone]

## 2022-08-26 NOTE — Progress Notes (Signed)
08/26/2022 Name: [name] MRN: [id number] DOB: November 06, 1955  Chief Complaint  Patient presents with   Medication Management   Medication Adherence    [name] is a 67 y.o. year old female who presented for a telephone visit.   They were referred to the pharmacist by a quality report for assistance in managing diabetes and medication access.   Subjective:  Care Team: Primary Care Provider: Reubin Milan, MD ; Next Scheduled Visit: 10/11/2022  Medication Access/Adherence  Current Pharmacy:  [facility] - [place], [place] - [address] [place] [address] Phone: [phone] Fax: [phone]  [facility] [address]), [place] - [address] [place] ([place]) [place] [address] Phone: [phone] Fax: [phone]  [facility] [address] [place] [address] Phone: [phone] Fax: [phone]   Patient reports affordability concerns with their medications: Yes  Patient reports access/transportation concerns to their pharmacy: No  Patient reports adherence concerns with their medications:  No     Diabetes:   Current medications:  - Farxiga 10 mg daily before breakfast - Ozempic 0.25 mg weekly on Mondays Note patient started on 6/10 from sample from PCP, but reports stopped taking for past 2 doses as was concerned about affordability            Medications tried in the past: metformin (unable to tolerate)  Patient checks home blood sugar once daily using One Touch Verio meter   Current glucose readings: morning fasting ranging: 123-176   Reports has made positive dietary changes, including: - cut back on snacking on watermelon - Switched from ginger ale to club soda    Physical Activity: stays active throughout the day walking her dog at least twice day and maintaining garden everyday   Statin therapy: pravastatin 10 mg twice  weekly   Current medication access support: - Patient approved for patient assistance for Farxiga from [employer] through 01/28/2023             Reports received a 3 month supply of Farxiga by mail from [employer] - Based on reported income, patient would meet criteria for patient assistance for Ozempic from [employer], but PCP office unable to accept patient assistance medication deliver at this time       Objective:  Lab Results  Component Value Date   HGBA1C 7.1 (H) 06/06/2022    Lab Results  Component Value Date   CREATININE 0.67 06/06/2022   BUN 18 06/06/2022   NA 141 06/06/2022   K 4.5 06/06/2022   CL 101 06/06/2022   CO2 22 06/06/2022    Lab Results  Component Value Date   CHOL 203 (H) 06/06/2022   HDL 50 06/06/2022   LDLCALC 124 (H) 06/06/2022   LDLDIRECT 161.1 11/08/2010   TRIG 165 (H) 06/06/2022   CHOLHDL 4.1 06/06/2022    Medications Reviewed Today     Reviewed by Manuela Neptune, RPH-CPP (Pharmacist) on 08/26/22 at 1834  Med List Status: <None>   Medication Order Taking? Sig Documenting Provider Last Dose Status Informant  Cholecalciferol 25 MCG (1000 UT) TBDP [id number]  Take 1 tablet by mouth daily. [provider]  Active   dapagliflozin propanediol (FARXIGA) 10 MG TABS tablet [id number] Yes Take 1 tablet (10 mg total) by mouth daily before breakfast. Reubin Milan, MD Taking Active   diazepam (VALIUM) 5 MG tablet [id number]  Take 1  tablet (5 mg total) by mouth every 6 (six) hours as needed for anxiety. Reubin Milan, MD  Active   DULoxetine (CYMBALTA) 60 MG capsule [id number]  Take 2 capsules (120 mg total) by mouth daily. Reubin Milan, MD  Active   famotidine (PEPCID) 20 MG tablet [id number]  Take 1 tablet (20 mg total) by mouth 2 (two) times daily. Reubin Milan, MD  Active   glucose blood [facility] ULTRA) test strip [id number]  Use to test blood sugar twice a day Reubin Milan, MD  Active Self  Insulin Pen Needle (PEN NEEDLES) 31G  X 5 MM MISC [id number]  1 each by Does not apply route once a week. Reubin Milan, MD  Active   losartan-hydrochlorothiazide [facility]) 100-12.5 MG tablet [id number]  Take 1 tablet by mouth daily. Reubin Milan, MD  Active   naproxen (NAPROSYN) 250 MG tablet [id number]  Take 250 mg by mouth 2 (two) times daily with a meal. [provider]  Active   nebivolol (BYSTOLIC) 5 MG tablet [id number]  Take 1 tablet (5 mg total) by mouth daily. Reubin Milan, MD  Active   pravastatin (PRAVACHOL) 10 MG tablet [id number]  Take 1 tablet (10 mg total) by mouth 2 (two) times a week. Reubin Milan, MD  Active   Semaglutide,0.25 or 0.5MG /DOS, (OZEMPIC, 0.25 OR 0.5 MG/DOSE,) 2 MG/3ML SOPN [id number]  Inject 0.25 mg into the skin once a week for 28 days, THEN 0.5 mg once a week for 14 days. Reubin Milan, MD  Active   vitamin B-12 (CYANOCOBALAMIN) 1000 MCG tablet [id number]  Take 1,000 mcg by mouth daily. [provider]  Active Self  zolpidem (AMBIEN) 10 MG tablet [id number]  Take 1 tablet (10 mg total) by mouth at bedtime as needed for sleep. Reubin Milan, MD  Active               Assessment/Plan:   Diabetes: - Reviewed long term cardiovascular and renal outcomes of uncontrolled blood sugar - Reviewed goal A1c, goal fasting, and goal 2 hour post prandial glucose - Reviewed dietary modifications including encourage patient to have regular well-balanced meals and snacks, while controlling carbohydrate portion sizes Discuss ideas for lower sugar/carbohydrate balanced snacks Encourage patient to incorporate non-starchy vegetables in her meals Encourage patient to review nutrition labels for total carbohydrate content of foods - Counsel on strategies to aid with tolerability to Ozempic - Collaborate with Pharmacist Havery Moros regarding possibility of [facility] helping patient with applying for Ozempic patient assistance for patient to receive through the  pharmacy.  Request PCP send new Rx for Ozempic to [facility] for patient Send message to Patient Advocate Specialist Bernadene Person to request aid to patient with applying for this assistance program - Recommend to continue to check glucose, keep log of results and have this record to review for future medical appointments     Follow Up Plan: Clinical Pharmacist will follow up with patient by telephone again on 12/04/2022 at 11:30 am   Estelle Grumbles, PharmD, [facility] [phone]

## 2022-08-28 ENCOUNTER — Other Ambulatory Visit: Payer: Self-pay

## 2022-08-29 ENCOUNTER — Encounter: Payer: Self-pay | Admitting: Internal Medicine

## 2022-09-02 ENCOUNTER — Other Ambulatory Visit: Payer: Self-pay | Admitting: Internal Medicine

## 2022-09-02 DIAGNOSIS — F5101 Primary insomnia: Secondary | ICD-10-CM

## 2022-09-05 DIAGNOSIS — G4733 Obstructive sleep apnea (adult) (pediatric): Secondary | ICD-10-CM | POA: Diagnosis not present

## 2022-09-06 ENCOUNTER — Other Ambulatory Visit: Payer: Self-pay

## 2022-09-06 MED ORDER — OZEMPIC (0.25 OR 0.5 MG/DOSE) 2 MG/3ML ~~LOC~~ SOPN
0.5000 mg | PEN_INJECTOR | SUBCUTANEOUS | 3 refills | Status: DC
  Filled 2022-09-20: qty 15, 150d supply, fill #0

## 2022-09-20 ENCOUNTER — Other Ambulatory Visit: Payer: Self-pay

## 2022-09-24 ENCOUNTER — Other Ambulatory Visit: Payer: Self-pay

## 2022-09-25 ENCOUNTER — Other Ambulatory Visit: Payer: Self-pay

## 2022-10-06 DIAGNOSIS — G4733 Obstructive sleep apnea (adult) (pediatric): Secondary | ICD-10-CM | POA: Diagnosis not present

## 2022-10-11 ENCOUNTER — Encounter: Payer: Self-pay | Admitting: Internal Medicine

## 2022-10-11 ENCOUNTER — Ambulatory Visit (INDEPENDENT_AMBULATORY_CARE_PROVIDER_SITE_OTHER): Payer: PPO | Admitting: Internal Medicine

## 2022-10-11 VITALS — BP 122/78 | HR 76 | Ht 64.5 in | Wt 190.0 lb

## 2022-10-11 DIAGNOSIS — E782 Mixed hyperlipidemia: Secondary | ICD-10-CM

## 2022-10-11 DIAGNOSIS — G4733 Obstructive sleep apnea (adult) (pediatric): Secondary | ICD-10-CM

## 2022-10-11 DIAGNOSIS — E118 Type 2 diabetes mellitus with unspecified complications: Secondary | ICD-10-CM

## 2022-10-11 DIAGNOSIS — I10 Essential (primary) hypertension: Secondary | ICD-10-CM

## 2022-10-11 DIAGNOSIS — F331 Major depressive disorder, recurrent, moderate: Secondary | ICD-10-CM

## 2022-10-11 DIAGNOSIS — F5101 Primary insomnia: Secondary | ICD-10-CM

## 2022-10-11 DIAGNOSIS — Z23 Encounter for immunization: Secondary | ICD-10-CM | POA: Diagnosis not present

## 2022-10-11 DIAGNOSIS — Z7984 Long term (current) use of oral hypoglycemic drugs: Secondary | ICD-10-CM

## 2022-10-11 LAB — POCT GLYCOSYLATED HEMOGLOBIN (HGB A1C): Hemoglobin A1C: 6.8 % — AB (ref 4.0–5.6)

## 2022-10-11 MED ORDER — PRAVASTATIN SODIUM 10 MG PO TABS: 10.0000 mg | ORAL_TABLET | ORAL | 1 refills | Status: DC

## 2022-10-11 MED ORDER — NEBIVOLOL HCL 5 MG PO TABS: 5.0000 mg | ORAL_TABLET | Freq: Every day | ORAL | 1 refills | Status: DC

## 2022-10-11 MED ORDER — ZOLPIDEM TARTRATE 10 MG PO TABS
10.0000 mg | ORAL_TABLET | Freq: Every evening | ORAL | 5 refills | Status: DC | PRN
Start: 2022-10-11 — End: 2023-05-01

## 2022-10-11 MED ORDER — DULOXETINE HCL 60 MG PO CPEP
120.0000 mg | ORAL_CAPSULE | Freq: Every day | ORAL | 1 refills | Status: DC
Start: 2022-10-11 — End: 2023-05-16

## 2022-10-11 MED ORDER — LOSARTAN POTASSIUM-HCTZ 100-12.5 MG PO TABS: 1.0000 | ORAL_TABLET | Freq: Every day | ORAL | 1 refills | Status: DC

## 2022-10-11 NOTE — Assessment & Plan Note (Addendum)
Clinically stable on current regimen with good control of symptoms, No SI or HI. No change in management at this time. Continue Duloxetine 120 mg per day

## 2022-10-11 NOTE — Assessment & Plan Note (Addendum)
Blood sugars stable without hypoglycemic symptoms or events. Current regimen is [place] and ozempic 0.5 mg (just increased from 0.25 2 weeks ago) Changes made last visit are continuing ozempic without increase to 1 mg.However, she is still having nausea for 3 days out of 7 - relieved fairly well with rolaids. Lab Results  Component Value Date   HGBA1C 7.1 (H) 06/06/2022  A1C today = 5.8 - she can continue Ozempic 0.5 weekly or reduce the dose back to 0.25 mg

## 2022-10-11 NOTE — Assessment & Plan Note (Signed)
Now wearing CPAP again and getting at least 4 hours per night. Her fatigue and depression are improving.

## 2022-10-11 NOTE — Progress Notes (Signed)
Date:  10/11/2022   Name:  [name]   DOB:  [birth year]   MRN:  [id number]   Chief Complaint: Diabetes and Depression  Diabetes She presents for her follow-up diabetic visit. She has type 2 diabetes mellitus. Her disease course has been stable. Pertinent negatives for hypoglycemia include no dizziness or headaches. Pertinent negatives for diabetes include no chest pain, no fatigue and no weakness. Current diabetic treatments: Farxiga and Ozempic.  Depression        This is a chronic problem.  The problem has been gradually worsening since onset.  Associated symptoms include no fatigue and no headaches.  Past treatments include SNRIs - Serotonin and norepinephrine reuptake inhibitors.   Lab Results  Component Value Date   NA 141 06/06/2022   K 4.5 06/06/2022   CO2 22 06/06/2022   GLUCOSE 119 (H) 06/06/2022   BUN 18 06/06/2022   CREATININE 0.67 06/06/2022   CALCIUM 10.0 06/06/2022   EGFR 96 06/06/2022   GFRNONAA >60 10/11/2020   Lab Results  Component Value Date   CHOL 203 (H) 06/06/2022   HDL 50 06/06/2022   LDLCALC 124 (H) 06/06/2022   LDLDIRECT 161.1 11/08/2010   TRIG 165 (H) 06/06/2022   CHOLHDL 4.1 06/06/2022   Lab Results  Component Value Date   TSH 1.460 06/06/2022   Lab Results  Component Value Date   HGBA1C 6.8 (A) 10/11/2022   Lab Results  Component Value Date   WBC 7.9 06/06/2022   HGB 12.5 06/06/2022   HCT 41.3 06/06/2022   MCV 82 06/06/2022   PLT 325 06/06/2022   Lab Results  Component Value Date   ALT 40 (H) 06/06/2022   AST 37 06/06/2022   ALKPHOS 135 (H) 06/06/2022   BILITOT 0.4 06/06/2022   Lab Results  Component Value Date   VD25OH 30.2 06/06/2022     Review of Systems  Constitutional:  Negative for fatigue and unexpected weight change.  HENT:  Negative for nosebleeds.   Eyes:  Negative for visual disturbance.  Respiratory:  Negative for cough, chest tightness, shortness of breath and wheezing.   Cardiovascular:  Negative  for chest pain, palpitations and leg swelling.  Gastrointestinal:  Negative for abdominal pain, constipation and diarrhea.  Neurological:  Negative for dizziness, weakness, light-headedness and headaches.  Psychiatric/Behavioral:  Positive for depression.     Patient Active Problem List   Diagnosis Date Noted   Adenomatous polyp of colon 06/20/2022   Screening for colon cancer 06/20/2022   Anemia 11/08/2021   S/P hysterectomy 02/09/2020   Moderate episode of recurrent major depressive disorder (HCC) 02/09/2020   Neuropathy 07/30/2019   Osteopenia determined by x-ray 04/26/2019   Hyperlipidemia associated with type 2 diabetes mellitus (HCC) 07/15/2017   Type II diabetes mellitus with complication (HCC) 04/18/2017   Vitamin D deficiency 04/18/2017   OSA on CPAP 02/28/2015   Obesity 02/28/2015   Hemorrhoids 03/04/2012   Insomnia 03/04/2012   Arthralgia 03/04/2012   Migraine without aura or status migrainosus 03/04/2012   Arthralgia of right temporomandibular joint 03/04/2012   Essential hypertension 11/08/2010    Allergies  Allergen Reactions   Amlodipine Swelling    And headache    Past Surgical History:  Procedure Laterality Date   ABDOMINAL HYSTERECTOMY  2008   for Menorrhagia   BREAST REDUCTION SURGERY     COLONOSCOPY     COLONOSCOPY WITH PROPOFOL N/A 06/20/2022   Procedure: COLONOSCOPY WITH PROPOFOL;  Surgeon: [name], MD;  [facility]  ENDOSCOPY;  Service: Gastroenterology;  Laterality: N/A;   LAPAROSCOPY     OPEN REDUCTION INTERNAL FIXATION (ORIF) DISTAL RADIAL FRACTURE Right 10/12/2020   Procedure: OPEN REDUCTION INTERNAL FIXATION (ORIF) DISTAL RADIAL FRACTURE;  Surgeon: Kennedy Bucker, MD;  Location: [facility] ORS;  Service: Orthopedics;  Laterality: Right;   REDUCTION MAMMAPLASTY Bilateral 1991    Social History   Tobacco Use   Smoking status: Never   Smokeless tobacco: Never  Vaping Use   Vaping status: Never Used  Substance Use Topics   Alcohol use: Not  Currently    Alcohol/week: 2.0 standard drinks of alcohol    Types: 2 Glasses of wine per week   Drug use: Never     Medication list has been reviewed and updated.  Current Meds  Medication Sig   Cholecalciferol 25 MCG (1000 UT) TBDP Take 1 tablet by mouth daily.   dapagliflozin propanediol (FARXIGA) 10 MG TABS tablet Take 1 tablet (10 mg total) by mouth daily before breakfast.   diazepam (VALIUM) 5 MG tablet Take 1 tablet (5 mg total) by mouth every 6 (six) hours as needed for anxiety.   glucose blood (ONETOUCH ULTRA) test strip Use to test blood sugar twice a day   Insulin Pen Needle (PEN NEEDLES) 31G X 5 MM MISC 1 each by Does not apply route once a week.   Semaglutide,0.25 or 0.5MG /DOS, (OZEMPIC, 0.25 OR 0.5 MG/DOSE,) 2 MG/3ML SOPN Inject 0.5 mg into the skin every 7 (seven) days.   vitamin B-12 (CYANOCOBALAMIN) 1000 MCG tablet Take 1,000 mcg by mouth daily.   [DISCONTINUED] DULoxetine (CYMBALTA) 60 MG capsule Take 2 capsules (120 mg total) by mouth daily.   [DISCONTINUED] losartan-hydrochlorothiazide (HYZAAR) 100-12.5 MG tablet Take 1 tablet by mouth daily.   [DISCONTINUED] nebivolol (BYSTOLIC) 5 MG tablet Take 1 tablet (5 mg total) by mouth daily.   [DISCONTINUED] pravastatin (PRAVACHOL) 10 MG tablet Take 1 tablet (10 mg total) by mouth 2 (two) times a week.   [DISCONTINUED] zolpidem (AMBIEN) 10 MG tablet Take 1 tablet (10 mg total) by mouth at bedtime asneeded for sleep.       10/11/2022    3:18 PM 06/06/2022   10:43 AM 04/18/2022    9:06 AM 04/08/2022   10:27 AM  GAD 7 : Generalized Anxiety Score  Nervous, Anxious, on Edge 1 3 1 2   Control/stop worrying 1 3 1 2   Worry too much - different things 1 3 0 2  Trouble relaxing 1 3 1 2   Restless 1 0 0 0  Easily annoyed or irritable 2 3 1 2   Afraid - awful might happen 0 2 0 1  Total GAD 7 Score 7 17 4 11   Anxiety Difficulty Not difficult at all  Somewhat difficult Extremely difficult       10/11/2022    3:18 PM 06/06/2022    10:43 AM 05/30/2022    9:42 AM  Depression screen PHQ 2/9  Decreased Interest 1 3 1   Down, Depressed, Hopeless 1 3 1   PHQ - 2 Score 2 6 2   Altered sleeping 3 3 1   Tired, decreased energy 3 3 1   Change in appetite 2 3 0  Feeling bad or failure about yourself  1 3 0  Trouble concentrating 2 3 0  Moving slowly or fidgety/restless 0 3 0  Suicidal thoughts 0 0 0  PHQ-9 Score 13 24 4   Difficult doing work/chores Somewhat difficult Very difficult Not difficult at all    BP Readings from Last 3  Encounters:  10/11/22 122/78  06/20/22 103/65  06/06/22 128/70    Physical Exam Vitals and nursing note reviewed.  Constitutional:      General: She is not in acute distress.    Appearance: She is well-developed.  HENT:     Head: Normocephalic and atraumatic.  Neck:     Vascular: No carotid bruit.  Cardiovascular:     Rate and Rhythm: Normal rate and regular rhythm.     Heart sounds: No murmur heard. Pulmonary:     Effort: Pulmonary effort is normal. No respiratory distress.     Breath sounds: No wheezing or rhonchi.  Musculoskeletal:     Cervical back: Normal range of motion.  Lymphadenopathy:     Cervical: No cervical adenopathy.  Skin:    General: Skin is warm and dry.     Capillary Refill: Capillary refill takes less than 2 seconds.     Findings: No rash.  Neurological:     Mental Status: She is alert and oriented to person, place, and time.  Psychiatric:        Mood and Affect: Mood normal.        Behavior: Behavior normal.     Wt Readings from Last 3 Encounters:  10/11/22 190 lb (86.2 kg)  06/20/22 190 lb 9.6 oz (86.5 kg)  06/06/22 191 lb (86.6 kg)    BP 122/78   Pulse 76   Ht 5' 4.5" (1.638 m)   Wt 190 lb (86.2 kg)   SpO2 99%   BMI 32.11 kg/m   Assessment and Plan:  Problem List Items Addressed This Visit       Unprioritized   Type II diabetes mellitus with complication (HCC) - Primary (Chronic)    Blood sugars stable without hypoglycemic symptoms or  events. Current regimen is [place] and ozempic 0.5 mg (just increased from 0.25 2 weeks ago) Changes made last visit are continuing ozempic without increase to 1 mg.However, she is still having nausea for 3 days out of 7 - relieved fairly well with rolaids. Lab Results  Component Value Date   HGBA1C 7.1 (H) 06/06/2022  A1C today = 5.8 - she can continue Ozempic 0.5 weekly or reduce the dose back to 0.25 mg        Relevant Medications   losartan-hydrochlorothiazide (HYZAAR) 100-12.5 MG tablet   pravastatin (PRAVACHOL) 10 MG tablet (Start on 10/14/2022)   Other Relevant Orders   POCT glycosylated hemoglobin (Hb A1C) (Completed)   OSA on CPAP    Now wearing CPAP again and getting at least 4 hours per night. Her fatigue and depression are improving.      Moderate episode of recurrent major depressive disorder (HCC) (Chronic)    Clinically stable on current regimen with good control of symptoms, No SI or HI. No change in management at this time. Continue Duloxetine 120 mg per day       Relevant Medications   DULoxetine (CYMBALTA) 60 MG capsule   Insomnia   Relevant Medications   zolpidem (AMBIEN) 10 MG tablet   Essential hypertension (Chronic)   Relevant Medications   losartan-hydrochlorothiazide (HYZAAR) 100-12.5 MG tablet   nebivolol (BYSTOLIC) 5 MG tablet   pravastatin (PRAVACHOL) 10 MG tablet (Start on 10/14/2022)   Other Visit Diagnoses     Mixed hyperlipidemia  (Chronic)      Relevant Medications   losartan-hydrochlorothiazide (HYZAAR) 100-12.5 MG tablet   nebivolol (BYSTOLIC) 5 MG tablet   pravastatin (PRAVACHOL) 10 MG tablet (Start on 10/14/2022)  Long term current use of oral hypoglycemic drug       Need for immunization against influenza       Relevant Orders   Flu Vaccine Trivalent High Dose (Fluad) (Completed)       Return in about 4 months (around 02/10/2023) for HTN, DM.    Reubin Milan, MD [facility]  [place]

## 2022-10-11 NOTE — Patient Instructions (Signed)
Schedule Eye exam at [facility]

## 2022-10-22 ENCOUNTER — Telehealth: Payer: Self-pay | Admitting: Internal Medicine

## 2022-10-22 NOTE — Telephone Encounter (Signed)
Will re-print and give to Dr Judithann Graves to sign and we will fax back.  - Mohogany Toppins

## 2022-10-22 NOTE — Telephone Encounter (Signed)
Copied from CRM [id number]. Topic: General - Other >> Oct 22, 2022 10:23 AM Macon Large wrote: Reason for CRM: Carollee Herter with [employer] called for an update on the fax sent on 10/15/22. Per Carollee Herter the fax shows in media but no signature from provider. Cb# [phone] Ext. [phone]

## 2022-11-05 DIAGNOSIS — G4733 Obstructive sleep apnea (adult) (pediatric): Secondary | ICD-10-CM | POA: Diagnosis not present

## 2022-11-06 DIAGNOSIS — G4733 Obstructive sleep apnea (adult) (pediatric): Secondary | ICD-10-CM | POA: Diagnosis not present

## 2022-12-04 ENCOUNTER — Encounter: Payer: Self-pay | Admitting: Pharmacist

## 2022-12-04 ENCOUNTER — Other Ambulatory Visit: Payer: PPO | Admitting: Pharmacist

## 2022-12-04 ENCOUNTER — Other Ambulatory Visit: Payer: Self-pay

## 2022-12-04 NOTE — Progress Notes (Signed)
12/04/2022 Name: [name] MRN: [id number] DOB: Mar 24, 1955  Chief Complaint  Patient presents with   Medication Assistance   Medication Management    ARNETA Barber is a 67 y.o. year old female who presented for a telephone visit.   They were referred to the pharmacist by a quality report for assistance in managing diabetes and medication access.    Subjective:   Care Team: Primary Care Provider: Reubin Milan, MD ; Next Scheduled Visit: 02/12/2023  Medication Access/Adherence  Current Pharmacy:  [facility] - [place], [place] - [address] [place] [address] Phone: [phone] Fax: [phone]  [facility] [address]), [place] - [address] [place]) [place] [address] Phone: [phone] Fax: [phone]  [facility] [address] [place] [address] Phone: [phone] Fax: [phone]   Patient reports affordability concerns with their medications: Yes  Patient reports access/transportation concerns to their pharmacy: No  Patient reports adherence concerns with their medications:  No     Diabetes:   Current medications:  - Farxiga 10 mg daily before breakfast - Ozempic 0.5 mg weekly on Mondays (increased to current dose ~4 weeks ago) Reports continues to have some nausea and acid reflux symptoms since started this dose. Note has discussed with            Medications tried in the past: metformin (unable to tolerate)   Patient checks home blood sugar once daily using One Touch Verio meter   Current glucose readings: morning fasting ranging: 132-158   Reports has made positive dietary changes, including: - trying to limit sugary beverages (ginger ale)   Physical Activity: stays active throughout the day walking her dog at least twice day and maintaining garden everyday   Statin therapy: pravastatin 10 mg twice weekly    Current medication access support: - Enrolled in patient assistance for Farxiga from [employer] through 01/28/2023             Reports has > 3 month supply of Farxiga remaining - Enrolled in patient assistance for Ozempic from [employer] through 01/28/2023 - assisted with enrollment by Patient Advocate Specialist Bernadene Person at [facility] (and medication ships to pharmacy  Reports has 4 month supply of Ozempic remaining Reports collaborating with Valda Lamb for support with re-enrollment in [employer] program for 2025   Objective:  Lab Results  Component Value Date   HGBA1C 6.8 (A) 10/11/2022    Lab Results  Component Value Date   CREATININE 0.67 06/06/2022   BUN 18 06/06/2022   NA 141 06/06/2022   K 4.5 06/06/2022   CL 101 06/06/2022   CO2 22 06/06/2022    Lab Results  Component Value Date   CHOL 203 (H) 06/06/2022   HDL 50 06/06/2022   LDLCALC 124 (H) 06/06/2022   LDLDIRECT 161.1 11/08/2010   TRIG 165 (H) 06/06/2022   CHOLHDL 4.1 06/06/2022    Medications Reviewed Today     Reviewed by Manuela Neptune, RPH-CPP (Pharmacist) on 12/04/22 at 1202  Med List Status: <None>   Medication Order Taking? Sig Documenting Provider Last Dose Status Informant  Cholecalciferol 25 MCG (1000 UT) TBDP [id number]  Take 1 tablet by mouth daily. [provider]  Active   dapagliflozin propanediol (FARXIGA) 10 MG TABS tablet [id number] Yes Take 1 tablet (10 mg total) by mouth daily before breakfast. Reubin Milan,  MD Taking Active   diazepam (VALIUM) 5 MG tablet [id number]  Take 1 tablet (5 mg total) by mouth every 6 (six) hours as needed for anxiety. Reubin Milan, MD  Active   DULoxetine (CYMBALTA) 60 MG capsule [id number]  Take 2 capsules (120 mg total) by mouth daily. Reubin Milan, MD  Active   glucose blood [facility] ULTRA) test strip [id number]  Use to test blood sugar twice a day Reubin Milan, MD  Active Self  Insulin Pen Needle (PEN NEEDLES) 31G X  5 MM MISC [id number]  1 each by Does not apply route once a week. Reubin Milan, MD  Active   losartan-hydrochlorothiazide [facility]) 100-12.5 MG tablet [id number]  Take 1 tablet by mouth daily. Reubin Milan, MD  Active   nebivolol (BYSTOLIC) 5 MG tablet [id number]  Take 1 tablet (5 mg total) by mouth daily. Reubin Milan, MD  Active   pravastatin (PRAVACHOL) 10 MG tablet [id number]  Take 1 tablet (10 mg total) by mouth 2 (two) times a week. Reubin Milan, MD  Active   Semaglutide,0.25 or 0.5MG /DOS, (OZEMPIC, 0.25 OR 0.5 MG/DOSE,) 2 MG/3ML SOPN [id number] Yes Inject 0.5 mg into the skin every 7 (seven) days. Reubin Milan, MD Taking Active   vitamin B-12 (CYANOCOBALAMIN) 1000 MCG tablet [id number]  Take 1,000 mcg by mouth daily. [provider]  Active Self  zolpidem (AMBIEN) 10 MG tablet [id number]  Take 1 tablet (10 mg total) by mouth at bedtime as needed for sleep. Reubin Milan, MD  Active               Assessment/Plan:   Diabetes: - Reviewed long term cardiovascular and renal outcomes of uncontrolled blood sugar - Reviewed goal A1c, goal fasting, and goal 2 hour post prandial glucose - Reviewed dietary modifications including encourage patient to have regular well-balanced meals and snacks, while controlling carbohydrate portion sizes Discuss ideas for lower sugar/carbohydrate balanced snacks - Counsel on strategies to aid with tolerability to Ozempic Reports will try using famotidine as needed as prescribed by PCP to aid with overnight acid symptoms - Patient to continue to collaborate with Patient Advocate Specialist Bernadene Person [facility] for assistance with re-enrollment for Ozempic patient assistance for patient to receive through the pharmacy.  - Recommend to continue to check glucose, keep log of results and have this record to review for future medical appointments - Will collaborate with PCP and CPhT to aid patient with re-enrollment  for Farxiga assistance from [employer] for 2025     Follow Up Plan: Clinical Pharmacist will follow up with patient by telephone again on 04/23/2023 at 10:30 AM   Estelle Grumbles, PharmD, [facility] [phone]

## 2022-12-04 NOTE — Patient Instructions (Signed)
Goals Addressed             This Visit's Progress    Pharmacy Goals       Please watch the mail for an envelope from [facility] containing the patient assistance program application. Please complete this application and mail back to [facility] Pharmacy Technician Noreene Larsson [place] along with a copy of your Medicare Part D prescription card and a copy of your proof of income document OR you can bring these documents to the office to have them faxed back to Attention: Pattricia Boss at Fax # [phone]   If you need to call Noreene Larsson, you can reach her at [phone]  If you need to reach out to patient assistance programs regarding refills or to find out the status of your application, you can do so by calling:  [employer] at [phone]  Thank you!  Estelle Grumbles, PharmD, [facility] [phone]  Our goal A1c is less than 7%. This corresponds with fasting sugars less than 130 and 2 hour after meal sugars less than 180. Please keep a log of your results when checking your blood sugar   Our goal bad cholesterol, or LDL, is less than 70. This is why it is important to continue taking your pravastatin  Thank you!  Estelle Grumbles, PharmD, [facility] [phone]

## 2022-12-06 DIAGNOSIS — G4733 Obstructive sleep apnea (adult) (pediatric): Secondary | ICD-10-CM | POA: Diagnosis not present

## 2022-12-13 ENCOUNTER — Other Ambulatory Visit: Payer: Self-pay

## 2022-12-16 ENCOUNTER — Other Ambulatory Visit: Payer: Self-pay | Admitting: Pharmacy Technician

## 2022-12-16 DIAGNOSIS — Z5986 Financial insecurity: Secondary | ICD-10-CM

## 2022-12-16 NOTE — Progress Notes (Signed)
Pharmacy Medication Assistance Program Note    12/16/2022  Patient ID: [name], female   DOB: 1955/05/20, 67 y.o.   MRN: [id number]     12/16/2022  Outreach Medication One  Initial Outreach Date (Medication One) 12/16/2022  Manufacturer Medication One [employer]  [employer] Farxiga  Dose of Farxiga 10mg   Type of Radiographer, therapeutic Assistance  Date Application Sent to Patient 12/18/2022  Application Items Requested Application;Proof of Income;Other  Date Application Sent to Prescriber 12/18/2022  Name of Prescriber Bari Edward       Signature   Pattricia Boss, CPhT [place]  Office: [phone] Fax: [phone] Email: [email or web address]

## 2022-12-30 ENCOUNTER — Other Ambulatory Visit: Payer: Self-pay

## 2023-01-05 DIAGNOSIS — G4733 Obstructive sleep apnea (adult) (pediatric): Secondary | ICD-10-CM | POA: Diagnosis not present

## 2023-01-17 ENCOUNTER — Other Ambulatory Visit: Payer: Self-pay | Admitting: Internal Medicine

## 2023-01-17 ENCOUNTER — Encounter: Payer: Self-pay | Admitting: Internal Medicine

## 2023-01-17 DIAGNOSIS — E118 Type 2 diabetes mellitus with unspecified complications: Secondary | ICD-10-CM

## 2023-01-17 MED ORDER — DAPAGLIFLOZIN PROPANEDIOL 10 MG PO TABS: 10.0000 mg | ORAL_TABLET | Freq: Every day | ORAL | 3 refills | Status: DC

## 2023-02-05 DIAGNOSIS — G4733 Obstructive sleep apnea (adult) (pediatric): Secondary | ICD-10-CM | POA: Diagnosis not present

## 2023-02-06 ENCOUNTER — Telehealth: Payer: Self-pay | Admitting: Pharmacy Technician

## 2023-02-06 DIAGNOSIS — Z5986 Financial insecurity: Secondary | ICD-10-CM

## 2023-02-06 NOTE — Progress Notes (Signed)
 Pharmacy Medication Assistance Program Note    02/06/2023  Patient ID: [name], female   DOB: 03-12-1955, 69 y.o.   MRN: [id number]     12/16/2022 02/06/2023  Outreach Medication One  Initial Outreach Date (Medication One) 12/16/2022   Manufacturer Medication One [employer]   [employer] Farxiga    Dose of Farxiga  10mg    Type of Radiographer, Therapeutic Assistance   Date Application Sent to Patient 12/18/2022   Application Items Requested Application;Proof of Income;Other   Date Application Sent to Prescriber 12/18/2022   Name of Prescriber Leita Adie   Date Application Received From Patient  02/05/2023  Application Items Received From Patient  Application;Proof of Income;Other  Date Application Received From Provider  12/19/2023  Date Application Submitted to Manufacturer  02/05/2023  Method Application Sent to Manufacturer  Fax     Signature  Kate Marzette Sola [facility]  Office: [phone] Fax: [phone] Email: [email or web address]

## 2023-02-11 ENCOUNTER — Telehealth: Payer: Self-pay | Admitting: Pharmacy Technician

## 2023-02-11 DIAGNOSIS — Z5986 Financial insecurity: Secondary | ICD-10-CM

## 2023-02-11 NOTE — Progress Notes (Signed)
 Pharmacy Medication Assistance Program Note    02/11/2023  Patient ID: [name], female   DOB: 01-13-1956, 68 y.o.   MRN: [id number]     12/16/2022 02/06/2023 02/11/2023  Outreach Medication One  Initial Outreach Date (Medication One) 12/16/2022    Manufacturer Medication One [employer]    [employer] Farxiga     Dose of Farxiga  10mg     Type of Assistance Manufacturer Assistance    Date Application Sent to Patient 12/18/2022    Application Items Requested Application;Proof of Income;Other    Date Application Sent to Prescriber 12/18/2022    Name of Prescriber Leita Adie    Date Application Received From Patient  02/05/2023   Application Items Received From Patient  Application;Proof of Income;Other   Date Application Received From Provider  12/19/2023   Date Application Submitted to Manufacturer  02/05/2023   Method Application Sent to Manufacturer  Fax   Patient Assistance Determination   Approved  Approval Start Date   01/29/2023  Approval End Date   01/28/2024  Additional Outreach Contact   Provider  Contacted Provider   Message   Care coordination call placed to [employer] in regard to Farxiga  application.  Per IVR system, patient is APPROVED 01/29/23-01/28/24. Medication and subsequent refills will process automatically and be delivered to the patient's home address listed on the application. Patient may call [employer] at any time to check on next shipment by calling [phone].  Will send in basket message to [facility] PharmD to notify patient at next office visit.  Kate Caddy, CPhT [place]  Office: [phone] Fax: [phone] Email: [email or web address]

## 2023-02-12 ENCOUNTER — Ambulatory Visit (INDEPENDENT_AMBULATORY_CARE_PROVIDER_SITE_OTHER): Payer: PPO | Admitting: Internal Medicine

## 2023-02-12 ENCOUNTER — Ambulatory Visit
Admission: RE | Admit: 2023-02-12 | Discharge: 2023-02-12 | Disposition: A | Discharge status: Home / Self Care | Payer: PPO | Source: Ambulatory Visit | Attending: Internal Medicine | Admitting: Internal Medicine

## 2023-02-12 ENCOUNTER — Encounter: Payer: Self-pay | Admitting: Internal Medicine

## 2023-02-12 ENCOUNTER — Ambulatory Visit
Admission: RE | Admit: 2023-02-12 | Discharge: 2023-02-12 | Disposition: A | Discharge status: Home / Self Care | Payer: PPO | Attending: Internal Medicine | Admitting: Internal Medicine

## 2023-02-12 VITALS — BP 110/62 | HR 76 | Ht 64.5 in | Wt 194.4 lb

## 2023-02-12 DIAGNOSIS — I10 Essential (primary) hypertension: Secondary | ICD-10-CM

## 2023-02-12 DIAGNOSIS — E118 Type 2 diabetes mellitus with unspecified complications: Secondary | ICD-10-CM

## 2023-02-12 DIAGNOSIS — M79672 Pain in left foot: Secondary | ICD-10-CM | POA: Diagnosis not present

## 2023-02-12 DIAGNOSIS — Z7984 Long term (current) use of oral hypoglycemic drugs: Secondary | ICD-10-CM | POA: Diagnosis not present

## 2023-02-12 LAB — POCT GLYCOSYLATED HEMOGLOBIN (HGB A1C): Hemoglobin A1C: 7.2 % — AB (ref 4.0–5.6)

## 2023-02-12 MED ORDER — TIRZEPATIDE 2.5 MG/0.5ML ~~LOC~~ SOAJ
2.5000 mg | SUBCUTANEOUS | 0 refills | Status: DC
Start: 2023-02-12 — End: 2023-03-10

## 2023-02-12 NOTE — Progress Notes (Signed)
 Date:  02/12/2023   Name:  [name]   DOB:  Mar 01, 1955   MRN:  [id number]   Chief Complaint: Diabetes, Hypertension, and Fall [name] Sunday, 3 days ago. Fell at home down stairs on ice. Left foot is in pain. Broke this fit 6 years ago. Hurts to bear weight. )  Diabetes She presents for her follow-up diabetic visit. She has type 2 diabetes mellitus. Pertinent negatives for hypoglycemia include no headaches or tremors. Pertinent negatives for diabetes include no chest pain, no fatigue, no polydipsia and no polyuria.  Hypertension This is a chronic problem. The problem is controlled. Pertinent negatives include no chest pain, headaches, palpitations or shortness of breath. Past treatments include beta blockers, angiotensin blockers and diuretics.  Foot Injury  The incident occurred 5 to 7 days ago. The injury mechanism was a fall. The pain is present in the left foot and left heel. The quality of the pain is described as aching. The pain is moderate. Pertinent negatives include no numbness.  Fell at home on 02/09/23 down her steps.  Review of Systems  Constitutional:  Negative for appetite change, fatigue, fever and unexpected weight change.  HENT:  Negative for tinnitus and trouble swallowing.   Eyes:  Negative for visual disturbance.  Respiratory:  Negative for cough, chest tightness and shortness of breath.   Cardiovascular:  Negative for chest pain, palpitations and leg swelling.  Gastrointestinal:  Negative for abdominal pain.  Endocrine: Negative for polydipsia and polyuria.  Genitourinary:  Negative for dysuria and hematuria.  Musculoskeletal:  Negative for arthralgias.  Neurological:  Negative for tremors, numbness and headaches.  Psychiatric/Behavioral:  Negative for dysphoric mood.      Lab Results  Component Value Date   NA 141 06/06/2022   K 4.5 06/06/2022   CO2 22 06/06/2022   GLUCOSE 119 (H) 06/06/2022   BUN 18 06/06/2022   CREATININE 0.67 06/06/2022    CALCIUM 10.0 06/06/2022   EGFR 96 06/06/2022   GFRNONAA >60 10/11/2020   Lab Results  Component Value Date   CHOL 203 (H) 06/06/2022   HDL 50 06/06/2022   LDLCALC 124 (H) 06/06/2022   LDLDIRECT 161.1 11/08/2010   TRIG 165 (H) 06/06/2022   CHOLHDL 4.1 06/06/2022   Lab Results  Component Value Date   TSH 1.460 06/06/2022   Lab Results  Component Value Date   HGBA1C 7.2 (A) 02/12/2023   Lab Results  Component Value Date   WBC 7.9 06/06/2022   HGB 12.5 06/06/2022   HCT 41.3 06/06/2022   MCV 82 06/06/2022   PLT 325 06/06/2022   Lab Results  Component Value Date   ALT 40 (H) 06/06/2022   AST 37 06/06/2022   ALKPHOS 135 (H) 06/06/2022   BILITOT 0.4 06/06/2022   Lab Results  Component Value Date   VD25OH 30.2 06/06/2022     Patient Active Problem List   Diagnosis Date Noted   Adenomatous polyp of colon 06/20/2022   Screening for colon cancer 06/20/2022   Anemia 11/08/2021   S/P hysterectomy 02/09/2020   Moderate episode of recurrent major depressive disorder (HCC) 02/09/2020   Neuropathy 07/30/2019   Osteopenia determined by x-ray 04/26/2019   Hyperlipidemia associated with type 2 diabetes mellitus (HCC) 07/15/2017   Type II diabetes mellitus with complication (HCC) 04/18/2017   Vitamin D  deficiency 04/18/2017   OSA on CPAP 02/28/2015   Obesity 02/28/2015   Hemorrhoids 03/04/2012   Insomnia 03/04/2012   Arthralgia 03/04/2012   Migraine without  aura or status migrainosus 03/04/2012   Arthralgia of right temporomandibular joint 03/04/2012   Essential hypertension 11/08/2010    Allergies  Allergen Reactions   Amlodipine  Swelling    And headache    Past Surgical History:  Procedure Laterality Date   ABDOMINAL HYSTERECTOMY  2008   for Menorrhagia   BREAST REDUCTION SURGERY     COLONOSCOPY     COLONOSCOPY WITH PROPOFOL  N/A 06/20/2022   Procedure: COLONOSCOPY WITH PROPOFOL ;  Surgeon: Luke Salaam, MD;  Location: [facility] ENDOSCOPY;  Service: Gastroenterology;   Laterality: N/A;   LAPAROSCOPY     OPEN REDUCTION INTERNAL FIXATION (ORIF) DISTAL RADIAL FRACTURE Right 10/12/2020   Procedure: OPEN REDUCTION INTERNAL FIXATION (ORIF) DISTAL RADIAL FRACTURE;  Surgeon: Molli Angelucci, MD;  Location: [facility] ORS;  Service: Orthopedics;  Laterality: Right;   REDUCTION MAMMAPLASTY Bilateral 1991    Social History   Tobacco Use   Smoking status: Never   Smokeless tobacco: Never  Vaping Use   Vaping status: Never Used  Substance Use Topics   Alcohol use: Not Currently    Alcohol/week: 2.0 standard drinks of alcohol    Types: 2 Glasses of wine per week   Drug use: Never     Medication list has been reviewed and updated.  Current Meds  Medication Sig   Cholecalciferol 25 MCG (1000 UT) TBDP Take 1 tablet by mouth daily.   dapagliflozin  propanediol (FARXIGA ) 10 MG TABS tablet Take 1 tablet (10 mg total) by mouth daily before breakfast.   diazepam  (VALIUM ) 5 MG tablet Take 1 tablet (5 mg total) by mouth every 6 (six) hours as needed for anxiety.   DULoxetine  (CYMBALTA ) 60 MG capsule Take 2 capsules (120 mg total) by mouth daily.   glucose blood (ONETOUCH ULTRA) test strip Use to test blood sugar twice a day   Insulin  Pen Needle (PEN NEEDLES) 31G X 5 MM MISC 1 each by Does not apply route once a week.   losartan -hydrochlorothiazide (HYZAAR) 100-12.5 MG tablet Take 1 tablet by mouth daily.   nebivolol  (BYSTOLIC ) 5 MG tablet Take 1 tablet (5 mg total) by mouth daily.   pravastatin  (PRAVACHOL ) 10 MG tablet Take 1 tablet (10 mg total) by mouth 2 (two) times a week.   tirzepatide (MOUNJARO) 2.5 MG/0.5ML Pen Inject 2.5 mg into the skin once a week.   vitamin B-12 (CYANOCOBALAMIN ) 1000 MCG tablet Take 1,000 mcg by mouth daily.   zolpidem  (AMBIEN ) 10 MG tablet Take 1 tablet (10 mg total) by mouth at bedtime as needed for sleep.       02/12/2023    1:34 PM 10/11/2022    3:18 PM 06/06/2022   10:43 AM 04/18/2022    9:06 AM  GAD 7 : Generalized Anxiety Score  Nervous,  Anxious, on Edge 1 1 3 1   Control/stop worrying 1 1 3 1   Worry too much - different things 1 1 3  0  Trouble relaxing 0 1 3 1   Restless 0 1 0 0  Easily annoyed or irritable 2 2 3 1   Afraid - awful might happen 0 0 2 0  Total GAD 7 Score 5 7 17 4   Anxiety Difficulty Somewhat difficult Not difficult at all  Somewhat difficult       02/12/2023    1:33 PM 10/11/2022    3:18 PM 06/06/2022   10:43 AM  Depression screen PHQ 2/9  Decreased Interest 3 1 3   Down, Depressed, Hopeless 1 1 3   PHQ - 2 Score 4 2 6  Altered sleeping 3 3 3   Tired, decreased energy 3 3 3   Change in appetite 3 2 3   Feeling bad or failure about yourself  1 1 3   Trouble concentrating 3 2 3   Moving slowly or fidgety/restless 0 0 3  Suicidal thoughts 0 0 0  PHQ-9 Score 17 13 24   Difficult doing work/chores Very difficult Somewhat difficult Very difficult    BP Readings from Last 3 Encounters:  02/12/23 110/62  10/11/22 122/78  06/20/22 103/65    Physical Exam Vitals and nursing note reviewed.  Constitutional:      General: She is not in acute distress.    Appearance: Normal appearance. She is well-developed.  HENT:     Head: Normocephalic and atraumatic.  Cardiovascular:     Rate and Rhythm: Normal rate and regular rhythm.  Pulmonary:     Effort: Pulmonary effort is normal. No respiratory distress.     Breath sounds: No wheezing or rhonchi.  Musculoskeletal:     Cervical back: Normal range of motion.     Left ankle: Swelling present. No deformity or ecchymosis. Normal range of motion.     Left foot: Swelling and bony tenderness (over heel and mid foot) present. Normal pulse.  Skin:    General: Skin is warm and dry.     Findings: No rash.  Neurological:     Mental Status: She is alert and oriented to person, place, and time.  Psychiatric:        Mood and Affect: Mood normal.        Behavior: Behavior normal.     Wt Readings from Last 3 Encounters:  02/12/23 194 lb 6.4 oz (88.2 kg)  10/11/22 190  lb (86.2 kg)  06/20/22 190 lb 9.6 oz (86.5 kg)    BP 110/62   Pulse 76   Ht 5' 4.5" (1.638 m)   Wt 194 lb 6.4 oz (88.2 kg)   SpO2 99%   BMI 32.85 kg/m   Assessment and Plan:  Problem List Items Addressed This Visit       Unprioritized   Essential hypertension (Chronic)   Stable exam with well controlled BP.  Currently taking losartan , hctz and bystolic . Tolerating medications.  No change for now.        Type II diabetes mellitus with complication (HCC) - Primary (Chronic)   Blood sugars stable without hypoglycemic symptoms or events. Currently managed with Farxiga .  Could not take Ozempic . Changes made last visit are none. Lab Results  Component Value Date   HGBA1C 6.8 (A) 10/11/2022  A1C today = 7.2 Will try Mounjaro 2.5 mg weekly.      Relevant Medications   tirzepatide (MOUNJARO) 2.5 MG/0.5ML Pen   Other Relevant Orders   POCT glycosylated hemoglobin (Hb A1C) (Completed)   Other Visit Diagnoses       Long term current use of oral hypoglycemic drug         Foot pain, left       s/p fall at home. now with foot pain and swelling - rec elevation and nsaids will get imaging   Relevant Orders   DG Foot Complete Left       Return in about 3 months (around 05/13/2023) for DM, Depression.    Sheron Dixons, MD [facility] Primary Care and Sports Medicine [place]

## 2023-02-12 NOTE — Assessment & Plan Note (Signed)
 Stable exam with well controlled BP.  Currently taking losartan , hctz and bystolic . Tolerating medications.  No change for now.

## 2023-02-12 NOTE — Assessment & Plan Note (Addendum)
 Blood sugars stable without hypoglycemic symptoms or events. Currently managed with Farxiga .  Could not take Ozempic . Changes made last visit are none. Lab Results  Component Value Date   HGBA1C 6.8 (A) 10/11/2022  A1C today = 7.2 Will try Mounjaro 2.5 mg weekly.

## 2023-02-17 ENCOUNTER — Telehealth: Payer: Self-pay

## 2023-02-17 ENCOUNTER — Telehealth: Payer: Self-pay | Admitting: Internal Medicine

## 2023-02-17 NOTE — Telephone Encounter (Signed)
Completed PA Mounjaro 2.5 on [email or web address].  (KeyIsidor Holts) PA Case ID #: [id number]  - Thresia Ramanathan

## 2023-02-17 NOTE — Telephone Encounter (Signed)
Approved until Feb 17 2024. Patient informed.

## 2023-02-17 NOTE — Telephone Encounter (Signed)
Copied from CRM [id number]. Topic: General - Other >> Feb 17, 2023 11:23 AM Everette C wrote: Reason for CRM: The patient has called for an update on their previously requested prior authorization for tirzepatide [facility]) 2.5 MG/0.5ML Pen [[id number]]  Please contact the patient further when possible

## 2023-02-17 NOTE — Telephone Encounter (Signed)
Patient informed of approval.  

## 2023-02-20 ENCOUNTER — Encounter: Payer: Self-pay | Admitting: Internal Medicine

## 2023-02-21 ENCOUNTER — Other Ambulatory Visit: Payer: Self-pay

## 2023-02-21 MED ORDER — BLOOD GLUCOSE TEST VI STRP: ORAL_STRIP | 12 refills | Status: AC

## 2023-02-21 MED ORDER — LANCET DEVICE MISC: 0 refills | Status: AC

## 2023-02-21 MED ORDER — BLOOD GLUCOSE MONITORING SUPPL DEVI: 0 refills | Status: AC

## 2023-03-03 ENCOUNTER — Encounter: Payer: Self-pay | Admitting: Internal Medicine

## 2023-03-04 ENCOUNTER — Encounter: Payer: Self-pay | Admitting: Internal Medicine

## 2023-03-05 ENCOUNTER — Ambulatory Visit
Admission: RE | Admit: 2023-03-05 | Discharge: 2023-03-05 | Disposition: A | Discharge status: Home / Self Care | Payer: PPO | Source: Ambulatory Visit | Attending: Family Medicine | Admitting: Family Medicine

## 2023-03-05 ENCOUNTER — Ambulatory Visit: Payer: Self-pay

## 2023-03-05 ENCOUNTER — Ambulatory Visit
Admission: RE | Admit: 2023-03-05 | Discharge: 2023-03-05 | Disposition: A | Discharge status: Home / Self Care | Payer: PPO | Attending: Family Medicine | Admitting: Family Medicine

## 2023-03-05 ENCOUNTER — Ambulatory Visit: Payer: PPO | Admitting: Family Medicine

## 2023-03-05 ENCOUNTER — Encounter: Payer: Self-pay | Admitting: Family Medicine

## 2023-03-05 ENCOUNTER — Other Ambulatory Visit: Payer: Self-pay | Admitting: Family Medicine

## 2023-03-05 VITALS — BP 118/70 | HR 84 | Ht 64.5 in | Wt 194.6 lb

## 2023-03-05 DIAGNOSIS — M79672 Pain in left foot: Secondary | ICD-10-CM | POA: Diagnosis not present

## 2023-03-05 DIAGNOSIS — M7672 Peroneal tendinitis, left leg: Secondary | ICD-10-CM | POA: Diagnosis not present

## 2023-03-05 DIAGNOSIS — Q6672 Congenital pes cavus, left foot: Secondary | ICD-10-CM | POA: Diagnosis not present

## 2023-03-05 DIAGNOSIS — M7732 Calcaneal spur, left foot: Secondary | ICD-10-CM | POA: Diagnosis not present

## 2023-03-05 DIAGNOSIS — M19072 Primary osteoarthritis, left ankle and foot: Secondary | ICD-10-CM | POA: Diagnosis not present

## 2023-03-05 DIAGNOSIS — S92012D Displaced fracture of body of left calcaneus, subsequent encounter for fracture with routine healing: Secondary | ICD-10-CM | POA: Diagnosis not present

## 2023-03-05 MED ORDER — DICLOFENAC SODIUM 75 MG PO TBEC: 75.0000 mg | DELAYED_RELEASE_TABLET | Freq: Two times a day (BID) | ORAL | 0 refills | Status: DC

## 2023-03-05 NOTE — Progress Notes (Signed)
 Primary Care / Sports Medicine Office Visit  Patient Information:  Patient ID: [name], female DOB: 1955/07/20 Age: 68 y.o. MRN: [id number]   [name] is a pleasant 68 y.o. female presenting with the following:  Chief Complaint  Patient presents with   Foot Swelling    Left foot swelling x 1 month. Difficult to walk, weightbearing, tingling and stabbing pian. Taking Naprosyn  2 tablets daily.    Vitals:   03/05/23 1123  BP: 118/70  Pulse: 84  SpO2: 95%   Vitals:   03/05/23 1123  Weight: 194 lb 9.6 oz (88.3 kg)  Height: 5' 4.5 (1.638 m)   Body mass index is 32.89 kg/m.  DG Ankle Complete Left Result Date: 03/05/2023 CLINICAL DATA:  Pain and swelling for 1 month. Left foot and ankle fracture 4 years ago. EXAM: LEFT ANKLE COMPLETE - 3+ VIEW; LEFT FOOT - COMPLETE 3+ VIEW COMPARISON:  Left foot radiographs 02/12/2023, left ankle radiographs 05/20/2018 FINDINGS: Left ankle: There is mild sclerosis within the mid calcaneal body consistent with interval healing of the prior calcaneal body comminuted fracture seen on 05/20/2018 radiographs and 05/26/2018 CT. The ankle mortise is symmetric and intact. Small plantar and posterior calcaneal heel spurs. Left foot: Mild pes cavus, unchanged. Montanide second through fifth interphalangeal joint space narrowing. No acute fracture or dislocation. IMPRESSION: 1. Interval healing of the prior remote calcaneal body comminuted fracture seen on 05/20/2018 radiographs and 05/26/2018 CT. 2. Small plantar and posterior calcaneal heel spurs. 3. Mild pes cavus, unchanged. Electronically Signed   By: Tanda Lyons M.D.   On: 03/05/2023 15:00   DG Foot Complete Left Result Date: 03/05/2023 CLINICAL DATA:  Pain and swelling for 1 month. Left foot and ankle fracture 4 years ago. EXAM: LEFT ANKLE COMPLETE - 3+ VIEW; LEFT FOOT - COMPLETE 3+ VIEW COMPARISON:  Left foot radiographs 02/12/2023, left ankle radiographs 05/20/2018 FINDINGS: Left ankle:  There is mild sclerosis within the mid calcaneal body consistent with interval healing of the prior calcaneal body comminuted fracture seen on 05/20/2018 radiographs and 05/26/2018 CT. The ankle mortise is symmetric and intact. Small plantar and posterior calcaneal heel spurs. Left foot: Mild pes cavus, unchanged. Montanide second through fifth interphalangeal joint space narrowing. No acute fracture or dislocation. IMPRESSION: 1. Interval healing of the prior remote calcaneal body comminuted fracture seen on 05/20/2018 radiographs and 05/26/2018 CT. 2. Small plantar and posterior calcaneal heel spurs. 3. Mild pes cavus, unchanged. Electronically Signed   By: Tanda Lyons M.D.   On: 03/05/2023 15:00   DG Foot Complete Left Result Date: 02/12/2023 CLINICAL DATA:  Pain after fall EXAM: LEFT FOOT - COMPLETE 3+ VIEW COMPARISON:  Left ankle x-ray 05/20/2018 FINDINGS: There is soft tissue swelling surrounding the ankle. There is no acute fracture or dislocation identified. Joint spaces are well maintained. IMPRESSION: Soft tissue swelling surrounding the ankle. No acute fracture or dislocation identified. Electronically Signed   By: Greig Pique M.D.   On: 02/12/2023 15:59     Independent interpretation of notes and tests performed by another provider:   Independent interpretation of left foot and ankle x-rays dated 03/04/2021 demonstrate no acute osseous processes, mortise maintained, degenerative cortical roughening about the medial malleolus, insertional Achilles enthesophyte and anterior calcaneus osteophyte, midfoot dorsal degenerative changes with associated overlying increased soft tissue shadow.  Procedures performed:   None  Pertinent History, Exam, Impression, and Recommendations:   Problem List Items Addressed This Visit     Osteoarthritis of left  midfoot - Primary   History of Present Illness [name] is a 69 year old female who presents with persistent foot pain, last evaluated by  her PCP Dr. Justus last month. Of note she has a history of a prior foot fractures as evident on CT from 05/26/2018.  She has been experiencing persistent foot pain since slipping on ice and falling down steps in early January. Initially, there was swelling and bruising, with the foot turning purple. She was evaluated on January 15th, diagnosed with a sprain, and advised to rest and take Naprosyn , which she has been taking twice daily since then, noted partial improvement.  The pain is described as stabbing, particularly at night when trying to lay down, and is located in the middle of the foot. During the day, she experiences tingling and burning sensations. The pain feels like a tight rubber band around her foot. There is no pain radiating to the sides of the ankle or up the lower leg, although she mentions a bump on the foot that does not cause pain unless pressed.  She notes that her toes have turned purple, which she attributes to swelling. She has been using an adjustable bed to elevate her feet at night to alleviate symptoms.  No numbness, tingling, or 'pins and needles' sensation.  Physical Exam MUSCULOSKELETAL: Mild to 1+ diffuse swelling and mild erythema about the foot. Pes cavus noted. Non-tender lateral malleolus, non-tender anterior talofibular ligament, non-tender posterior talofibular ligament, non-tender medial malleolus, non-tender calcaneofibular ligament. Mild tenderness just anterior to the medial malleolus at the joint line. Tenderness at the insertion of the posterior tibialis tendon. Non-tender Achilles, non-tender Achilles insertion, non-tender plantar fascia at the calcaneus.  Assessment and Plan Foot Pain Chronic foot pain exacerbated by a fall in January. Pain is worst at night and presents as a stabbing sensation in the middle of the foot. Foot is also described as feeling tight, as if a rubber band is wrapped around it. X-rays reveal arthritis in the foot and ankle, as  well as bone spurs. Tenderness noted in the posterior tibialis tendon. -Discontinue Naproxen  and start Diclofenac  for inflammation. -Use a walking boot for at least one week, then as needed for pain. -Consider over-the-counter heel cups for additional support and prevention of prior plantar fasciitis. -Check in via MyChart or phone call at 2-4 weeks to assess progress and consider cortisone injection if not improving.      Relevant Medications   diclofenac  (VOLTAREN ) 75 MG EC tablet   Peroneal tendinitis, left   See additional assessment(s) for plan details.      Other Visit Diagnoses       Foot pain, left       Relevant Medications   diclofenac  (VOLTAREN ) 75 MG EC tablet        Orders & Medications Medications:  Meds ordered this encounter  Medications   diclofenac  (VOLTAREN ) 75 MG EC tablet    Sig: Take 1 tablet (75 mg total) by mouth 2 (two) times daily. X 1 week then twice daily as-needed.    Dispense:  60 tablet    Refill:  0   No orders of the defined types were placed in this encounter.    No follow-ups on file.     Selinda JINNY Ku, MD, [facility]   Primary Care Sports Medicine Primary Care and Sports Medicine at [facility] [place]

## 2023-03-05 NOTE — Telephone Encounter (Signed)
 Chief Complaint: pain and swelling top of left foot Symptoms: prominent veins Frequency: 1 month Pertinent Negatives: Patient denies warmth or redness to the area  Disposition: [] ED /[] Urgent Care (no appt availability in office) / [x] Appointment(In office/virtual)/ []  [facility]/ [] Home Care/ [] Refused Recommended Disposition /[] [facility]/ []  Follow-up with PCP Additional Notes: called clinic and Jon was able to get pt att for 1100 with Dr Alvia.  Reason for Disposition  [1] SEVERE pain (e.g., excruciating, unable to do any normal activities) AND [2] not improved after 2 hours of pain medicine  Answer Assessment - Initial Assessment Questions 1. ONSET: When did the swelling start? (e.g., minutes, hours, days)     1 month 2. LOCATION: What part of the leg is swollen?  Are both legs swollen or just one leg?     Left foot 3. SEVERITY: How bad is the swelling? (e.g., localized; mild, moderate, severe)   - Localized: Small area of swelling localized to one leg.   - MILD pedal edema: Swelling limited to foot and ankle, pitting edema < 1/4 inch (6 mm) deep, rest and elevation eliminate most or all swelling.   - MODERATE edema: Swelling of lower leg to knee, pitting edema > 1/4 inch (6 mm) deep, rest and elevation only partially reduce swelling.   - SEVERE edema: Swelling extends above knee, facial or hand swelling present.      moderate 4. REDNESS: Does the swelling look red or infected?     Redness at ngiht 5. PAIN: Is the swelling painful to touch? If Yes, ask: How painful is it?   (Scale 1-10; mild, moderate or severe)     Moderate to severe 6. FEVER: Do you have a fever? If Yes, ask: What is it, how was it measured, and when did it start?      No fever 7. CAUSE: What do you think is causing the leg swelling?     Unsure  8. MEDICAL HISTORY: Do you have a history of blood clots (e.g., DVT), cancer, heart failure, kidney disease, or liver  failure?     no 9. RECURRENT SYMPTOM: Have you had leg swelling before? If Yes, ask: When was the last time? What happened that time?     yes 10. OTHER SYMPTOMS: Do you have any other symptoms? (e.g., chest pain, difficulty breathing)       Prominent  11. PREGNANCY: Is there any chance you are pregnant? When was your last menstrual period?       N/a  Answer Assessment - Initial Assessment Questions 1. ONSET: When did the pain start?      1 month 2. LOCATION: Where is the pain located?      Top of left foot 3. PAIN: How bad is the pain?    (Scale 1-10; or mild, moderate, severe)  - MILD (1-3): doesn't interfere with normal activities.   - MODERATE (4-7): interferes with normal activities (e.g., work or school) or awakens from sleep, limping.   - SEVERE (8-10): excruciating pain, unable to do any normal activities, unable to walk.      Moderate to severe 4. WORK OR EXERCISE: Has there been any recent work or exercise that involved this part of the body?      no 5. CAUSE: What do you think is causing the foot pain?     Unsure if tendon pulled with when she brroke her foot 6. OTHER SYMPTOMS: Do you have any other symptoms? (e.g., leg pain,  rash, fever, numbness)     Prominent veins  Protocols used: Leg Swelling and Edema-A-AH, Foot Pain-A-AH

## 2023-03-06 NOTE — Patient Instructions (Signed)
 Here's the management plan for your foot pain:  1. Discontinue Naproxen  and start taking Diclofenac  to help manage inflammation. 2. Use a walking boot for at least one week, and then as needed for pain management. Wean as discussed, starting at home then progressing to outside of the home. 3. Consider using over-the-counter heel cups for additional support and to help prevent a recurrence of plantar fasciitis. 4. Check in via MyChart or phone call in 2-4 weeks to assess your progress. If there is no improvement, we may consider a cortisone injection.  Please reach out if you have any questions or need further assistance with your treatment plan!

## 2023-03-06 NOTE — Assessment & Plan Note (Signed)
 See additional assessment(s) for plan details.

## 2023-03-06 NOTE — Assessment & Plan Note (Signed)
 History of Present Illness [name] is a 68 year old female who presents with persistent foot pain, last evaluated by her PCP Dr. Justus last month. Of note she has a history of a prior foot fractures as evident on CT from 05/26/2018.  She has been experiencing persistent foot pain since slipping on ice and falling down steps in early January. Initially, there was swelling and bruising, with the foot turning purple. She was evaluated on January 15th, diagnosed with a sprain, and advised to rest and take Naprosyn , which she has been taking twice daily since then, noted partial improvement.  The pain is described as stabbing, particularly at night when trying to lay down, and is located in the middle of the foot. During the day, she experiences tingling and burning sensations. The pain feels like a tight rubber band around her foot. There is no pain radiating to the sides of the ankle or up the lower leg, although she mentions a bump on the foot that does not cause pain unless pressed.  She notes that her toes have turned purple, which she attributes to swelling. She has been using an adjustable bed to elevate her feet at night to alleviate symptoms.  No numbness, tingling, or 'pins and needles' sensation.  Physical Exam MUSCULOSKELETAL: Mild to 1+ diffuse swelling and mild erythema about the foot. Pes cavus noted. Non-tender lateral malleolus, non-tender anterior talofibular ligament, non-tender posterior talofibular ligament, non-tender medial malleolus, non-tender calcaneofibular ligament. Mild tenderness just anterior to the medial malleolus at the joint line. Tenderness at the insertion of the posterior tibialis tendon. Non-tender Achilles, non-tender Achilles insertion, non-tender plantar fascia at the calcaneus.  Assessment and Plan Foot Pain Chronic foot pain exacerbated by a fall in January. Pain is worst at night and presents as a stabbing sensation in the middle of the foot. Foot  is also described as feeling tight, as if a rubber band is wrapped around it. X-rays reveal arthritis in the foot and ankle, as well as bone spurs. Tenderness noted in the posterior tibialis tendon. -Discontinue Naproxen  and start Diclofenac  for inflammation. -Use a walking boot for at least one week, then as needed for pain. -Consider over-the-counter heel cups for additional support and prevention of prior plantar fasciitis. -Check in via MyChart or phone call at 2-4 weeks to assess progress and consider cortisone injection if not improving.

## 2023-03-08 DIAGNOSIS — G4733 Obstructive sleep apnea (adult) (pediatric): Secondary | ICD-10-CM | POA: Diagnosis not present

## 2023-03-10 ENCOUNTER — Other Ambulatory Visit: Payer: Self-pay | Admitting: Internal Medicine

## 2023-03-10 DIAGNOSIS — E118 Type 2 diabetes mellitus with unspecified complications: Secondary | ICD-10-CM

## 2023-03-10 MED ORDER — TIRZEPATIDE 2.5 MG/0.5ML ~~LOC~~ SOAJ: 2.5000 mg | SUBCUTANEOUS | 1 refills | Status: DC

## 2023-03-10 NOTE — Progress Notes (Unsigned)
 Date:  03/10/2023   Name:  [name]   DOB:  28-Mar-1955   MRN:  [id number]   Chief Complaint: No chief complaint on file.  HPI  Review of Systems   Lab Results  Component Value Date   NA 141 06/06/2022   K 4.5 06/06/2022   CO2 22 06/06/2022   GLUCOSE 119 (H) 06/06/2022   BUN 18 06/06/2022   CREATININE 0.67 06/06/2022   CALCIUM 10.0 06/06/2022   EGFR 96 06/06/2022   GFRNONAA >60 10/11/2020   Lab Results  Component Value Date   CHOL 203 (H) 06/06/2022   HDL 50 06/06/2022   LDLCALC 124 (H) 06/06/2022   LDLDIRECT 161.1 11/08/2010   TRIG 165 (H) 06/06/2022   CHOLHDL 4.1 06/06/2022   Lab Results  Component Value Date   TSH 1.460 06/06/2022   Lab Results  Component Value Date   HGBA1C 7.2 (A) 02/12/2023   Lab Results  Component Value Date   WBC 7.9 06/06/2022   HGB 12.5 06/06/2022   HCT 41.3 06/06/2022   MCV 82 06/06/2022   PLT 325 06/06/2022   Lab Results  Component Value Date   ALT 40 (H) 06/06/2022   AST 37 06/06/2022   ALKPHOS 135 (H) 06/06/2022   BILITOT 0.4 06/06/2022   Lab Results  Component Value Date   VD25OH 30.2 06/06/2022     Patient Active Problem List   Diagnosis Date Noted   Osteoarthritis of left midfoot 03/05/2023   Peroneal tendinitis, left 03/05/2023   Adenomatous polyp of colon 06/20/2022   Screening for colon cancer 06/20/2022   Anemia 11/08/2021   S/P hysterectomy 02/09/2020   Moderate episode of recurrent major depressive disorder (HCC) 02/09/2020   Neuropathy 07/30/2019   Osteopenia determined by x-ray 04/26/2019   Hyperlipidemia associated with type 2 diabetes mellitus (HCC) 07/15/2017   Type II diabetes mellitus with complication (HCC) 04/18/2017   Vitamin D  deficiency 04/18/2017   OSA on CPAP 02/28/2015   Obesity 02/28/2015   Hemorrhoids 03/04/2012   Insomnia 03/04/2012   Arthralgia 03/04/2012   Migraine without aura or status migrainosus 03/04/2012   Arthralgia of right temporomandibular joint 03/04/2012    Essential hypertension 11/08/2010    Allergies  Allergen Reactions   Amlodipine  Swelling    And headache    Past Surgical History:  Procedure Laterality Date   ABDOMINAL HYSTERECTOMY  2008   for Menorrhagia   BREAST REDUCTION SURGERY     COLONOSCOPY     COLONOSCOPY WITH PROPOFOL  N/A 06/20/2022   Procedure: COLONOSCOPY WITH PROPOFOL ;  Surgeon: Luke Salaam, MD;  Location: [facility];  Service: Gastroenterology;  Laterality: N/A;   LAPAROSCOPY     OPEN REDUCTION INTERNAL FIXATION (ORIF) DISTAL RADIAL FRACTURE Right 10/12/2020   Procedure: OPEN REDUCTION INTERNAL FIXATION (ORIF) DISTAL RADIAL FRACTURE;  Surgeon: Molli Angelucci, MD;  Location: [facility] ORS;  Service: Orthopedics;  Laterality: Right;   REDUCTION MAMMAPLASTY Bilateral 1991    Social History   Tobacco Use   Smoking status: Never   Smokeless tobacco: Never  Vaping Use   Vaping status: Never Used  Substance Use Topics   Alcohol use: Not Currently    Alcohol/week: 2.0 standard drinks of alcohol    Types: 2 Glasses of wine per week   Drug use: Never     Medication list has been reviewed and updated.  No outpatient medications have been marked as taking for the 03/10/23 encounter (Orders Only) with Sheron Dixons, MD.  02/12/2023    1:34 PM 10/11/2022    3:18 PM 06/06/2022   10:43 AM 04/18/2022    9:06 AM  GAD 7 : Generalized Anxiety Score  Nervous, Anxious, on Edge 1 1 3 1   Control/stop worrying 1 1 3 1   Worry too much - different things 1 1 3  0  Trouble relaxing 0 1 3 1   Restless 0 1 0 0  Easily annoyed or irritable 2 2 3 1   Afraid - awful might happen 0 0 2 0  Total GAD 7 Score 5 7 17 4   Anxiety Difficulty Somewhat difficult Not difficult at all  Somewhat difficult       02/12/2023    1:33 PM 10/11/2022    3:18 PM 06/06/2022   10:43 AM  Depression screen PHQ 2/9  Decreased Interest 3 1 3   Down, Depressed, Hopeless 1 1 3   PHQ - 2 Score 4 2 6   Altered sleeping 3 3 3   Tired, decreased energy 3  3 3   Change in appetite 3 2 3   Feeling bad or failure about yourself  1 1 3   Trouble concentrating 3 2 3   Moving slowly or fidgety/restless 0 0 3  Suicidal thoughts 0 0 0  PHQ-9 Score 17 13 24   Difficult doing work/chores Very difficult Somewhat difficult Very difficult    BP Readings from Last 3 Encounters:  03/05/23 118/70  02/12/23 110/62  10/11/22 122/78    Physical Exam  Wt Readings from Last 3 Encounters:  03/05/23 194 lb 9.6 oz (88.3 kg)  02/12/23 194 lb 6.4 oz (88.2 kg)  10/11/22 190 lb (86.2 kg)    There were no vitals taken for this visit.  Assessment and Plan:  Problem List Items Addressed This Visit   None   No follow-ups on file.    Sheron Dixons, MD [facility] [place]

## 2023-03-12 ENCOUNTER — Encounter: Payer: Self-pay | Admitting: Pharmacist

## 2023-03-12 ENCOUNTER — Other Ambulatory Visit: Payer: Self-pay | Admitting: Pharmacist

## 2023-03-12 DIAGNOSIS — E118 Type 2 diabetes mellitus with unspecified complications: Secondary | ICD-10-CM

## 2023-03-12 MED ORDER — FREESTYLE LIBRE 3 PLUS SENSOR MISC
12 refills | Status: DC
Start: 2023-03-12 — End: 2023-12-24

## 2023-03-12 NOTE — Progress Notes (Signed)
03/12/2023 Name: [name] MRN: [id number] DOB: Jan 01, 1956  Chief Complaint  Patient presents with   Medication Assistance   Medication Management    [name] is a 68 y.o. year old female who presented for a telephone visit.   They were referred to the pharmacist by a quality report for assistance in managing diabetes and medication access.    Subjective:   Care Team: Primary Care Provider: Reubin Milan, MD ; Next Scheduled Visit: 05/21/2023  Medication Access/Adherence  Current Pharmacy:  [facility] - [place], [place] - [address] [place] [address] Phone: [phone] Fax: [phone]  [facility] [address]), [place] - [address] [place]) [place] [address] Phone: [phone] Fax: [phone]  [facility] [address] [place] [address] Phone: [phone] Fax: [phone]  [facility] - [place], [place] - [address] [place] [address] [place] [address] Phone: [phone] Fax: [phone]   Patient reports affordability concerns with their medications: Yes  Patient reports access/transportation concerns to their pharmacy: No  Patient reports adherence concerns with their medications:  No    Patient calling today to see about possible medication cost savings options   Diabetes:   Current medications:  - Farxiga 10 mg daily before breakfast - Mounjaro 2.5 mg weekly Reports started in January and tolerating well States has noticed an improvement in appetite control since started   Medications tried in the past: metformin (unable to tolerate); Ozempic (nausea/GI symptoms)    Current glucose readings: morning fasting readings running ~140  Expresses interest in using continuous glucose monitoring if covered through her insurance plan   Reports has made positive dietary changes, including: - stopped  drinking sugary beverages (ginger ale), drinking water instead - cut back on late night eating   Statin therapy: pravastatin 10 mg twice weekly   Current medication access support: Enrolled in patient assistance for Farxiga from [employer] through 01/28/2024  Objective:  Lab Results  Component Value Date   HGBA1C 7.2 (A) 02/12/2023    Lab Results  Component Value Date   CREATININE 0.67 06/06/2022   BUN 18 06/06/2022   NA 141 06/06/2022   K 4.5 06/06/2022   CL 101 06/06/2022   CO2 22 06/06/2022      Medications Reviewed Today     Reviewed by [name], RPH-CPP (Pharmacist) on 03/12/23 at 1710  Med List Status: <None>   Medication Order Taking? Sig Documenting Provider Last Dose Status Informant  Blood Glucose Monitoring Suppl DEVI [id number]  Use to test blood sugar twice daily. Reubin Milan, MD  Active   Cholecalciferol 25 MCG (1000 UT) TBDP [id number]  Take 1 tablet by mouth daily. [provider]  Active   Continuous Glucose Sensor (FREESTYLE LIBRE 3 PLUS SENSOR) MISC [id number] Yes Place 1 sensor on the skin every 15 days. Use to check glucose continuously Reubin Milan, MD  Active   dapagliflozin propanediol (FARXIGA) 10 MG TABS tablet [id number] Yes Take 1 tablet (10 mg total) by mouth daily before breakfast. Reubin Milan, MD Taking Active   diclofenac (VOLTAREN) 75 MG EC tablet [id number]  Take 1 tablet (75 mg total) by mouth 2 (two) times daily. X 1 week then twice daily as-needed. Jerrol Banana, MD  Active   DULoxetine (CYMBALTA) 60 MG capsule [id number]  Take 2 capsules (  120 mg total) by mouth daily. Reubin Milan, MD  Active   Glucose Blood (BLOOD GLUCOSE TEST STRIPS) STRP [id number]  Use to test blood sugar twice daily. Reubin Milan, MD  Active   glucose blood [facility] ULTRA) test strip [id number]  Use to test blood sugar twice a day Reubin Milan, MD  Active Self  Insulin Pen Needle (PEN NEEDLES) 31G X 5 MM MISC [id number]  1  each by Does not apply route once a week.  Patient not taking: Reported on 03/05/2023   Reubin Milan, MD  Active   Lancet Device MISC [id number]  Use to test blood sugar twice daily. Reubin Milan, MD  Active   losartan-hydrochlorothiazide [facility]) 100-12.5 MG tablet [id number]  Take 1 tablet by mouth daily. Reubin Milan, MD  Active   nebivolol (BYSTOLIC) 5 MG tablet [id number]  Take 1 tablet (5 mg total) by mouth daily. Reubin Milan, MD  Active   pravastatin (PRAVACHOL) 10 MG tablet [id number]  Take 1 tablet (10 mg total) by mouth 2 (two) times a week. Reubin Milan, MD  Active   tirzepatide [facility]) 2.5 MG/0.5ML Pen [id number] Yes Inject 2.5 mg into the skin once a week. Reubin Milan, MD Taking Active   vitamin B-12 (CYANOCOBALAMIN) 1000 MCG tablet [id number]  Take 1,000 mcg by mouth daily. [provider]  Active Self  zolpidem (AMBIEN) 10 MG tablet [id number]  Take 1 tablet (10 mg total) by mouth at bedtime as needed for sleep. Reubin Milan, MD  Active               Assessment/Plan:   Advise patient that at this time, there is not a patient assistance program available through manufacturer for [facility]  Review health plan benefits with patient per [employer] website. Note plan offers cost savings for patient to receive 3 month/100 day supply of medications As requested, will collaborate with PCP to ask if provider willing to send 100 day supply prescription for nebivolol to pharmacy for patient  Diabetes: - Reviewed long term cardiovascular and renal outcomes of uncontrolled blood sugar - Reviewed goal A1c, goal fasting, and goal 2 hour post prandial glucose - Reviewed dietary modifications including encourage patient to have regular well-balanced meals and snacks, while controlling carbohydrate portion sizes Discuss ideas for lower sugar/carbohydrate balanced snacks - Patient to contact [employer] patient assistance program as  needed for refills of Farxiga - Send prescription for Freestyle Libre 3 Plus CGM to pharmacy for patient per protocol Follow up with [employer]. Confirm CGM goes through patient's HTA coverage for no charge.  - Provide patient with counseling on Freestyle Libre 3 Plus CGM.  Patient plans to download and use Freestyle Libre 3 app on smart phone to use phone in place of reader device Advise patient to review Freestyle Libre 3 setup videos from manufacturer website and reach out to clinical pharmacist, pharmacy or office with any questions prior to starting use of this device Provide patient with phone number for [employer] Support at [phone]. Their customer support is available 7 days a week 8 am to 8 pm. - Recommend to continue to use Freestyle Libre 3 CGM to monitor blood sugar/as feedback on dietary choices Recommend to check glucose with fingerstick check when needed for symptoms and as back up to CGM. Patient to contact office if needed for readings outside of established parameters or symptoms   Follow Up Plan: Clinical Pharmacist will follow up with  patient by telephone again on 04/23/2023 at 10:30 AM    Estelle Grumbles, PharmD, [facility] [phone]

## 2023-03-12 NOTE — Patient Instructions (Signed)
Goals Addressed             This Visit's Progress    Pharmacy Goals       Please copy and paste the following web address into your web browser for the [employer] 3 Videos:   [email or web address]   You will need to download the Freestyle Libre 3 App to your smart phone in order to use your phone in place of the reader device   Please remember that you will need to apply a new Freestyle Libre 3 Plus sensor every 15 days   Also, you can reach out to the [employer] Support at [phone]. Their customer support is available 7 days a week 8 am to 8 pm.   Please review the video above and call me, your provider or the pharmacy with any questions before starting to use this device.   If you need to reach out to patient assistance programs regarding refills or to find out the status of your application, you can do so by calling:  [employer] at [phone]  Our goal A1c is less than 7%. This corresponds with fasting sugars less than 130 and 2 hour after meal sugars less than 180. Please keep a log of your results when checking your blood sugar   Our goal bad cholesterol, or LDL, is less than 70. This is why it is important to continue taking your pravastatin  Thank you!  Estelle Grumbles, PharmD, [facility] [phone]

## 2023-03-13 ENCOUNTER — Other Ambulatory Visit: Payer: Self-pay | Admitting: Internal Medicine

## 2023-03-13 DIAGNOSIS — I10 Essential (primary) hypertension: Secondary | ICD-10-CM

## 2023-03-13 MED ORDER — NEBIVOLOL HCL 5 MG PO TABS: 5.0000 mg | ORAL_TABLET | Freq: Every day | ORAL | 1 refills | Status: DC

## 2023-03-14 ENCOUNTER — Other Ambulatory Visit: Payer: Self-pay | Admitting: Internal Medicine

## 2023-03-14 DIAGNOSIS — Z1231 Encounter for screening mammogram for malignant neoplasm of breast: Secondary | ICD-10-CM

## 2023-03-16 ENCOUNTER — Encounter: Payer: Self-pay | Admitting: Family Medicine

## 2023-03-17 ENCOUNTER — Encounter: Payer: Self-pay | Admitting: Family Medicine

## 2023-03-17 ENCOUNTER — Other Ambulatory Visit: Payer: Self-pay

## 2023-03-17 NOTE — Telephone Encounter (Signed)
 Please review.  KP

## 2023-03-18 NOTE — Telephone Encounter (Signed)
Please call pt to schedule an appt per Dr. Ashley Royalty.  KP

## 2023-03-20 ENCOUNTER — Other Ambulatory Visit: Payer: Self-pay

## 2023-03-21 ENCOUNTER — Ambulatory Visit (INDEPENDENT_AMBULATORY_CARE_PROVIDER_SITE_OTHER): Payer: PPO | Admitting: Family Medicine

## 2023-03-21 ENCOUNTER — Encounter: Payer: Self-pay | Admitting: Family Medicine

## 2023-03-21 VITALS — BP 110/70 | HR 80 | Ht 64.5 in | Wt 193.0 lb

## 2023-03-21 DIAGNOSIS — M19072 Primary osteoarthritis, left ankle and foot: Secondary | ICD-10-CM

## 2023-03-24 ENCOUNTER — Encounter: Payer: Self-pay | Admitting: Family Medicine

## 2023-03-24 NOTE — Assessment & Plan Note (Signed)
 History of Present Illness [name] is a 68 year old female who presents with acute on chronic foot pain following a popping sensation roughly 1 week prior.  She experienced acute foot pain following a popping sensation, which radiated up to her knee and felt like 'a dagger splitting up the front of my foot.' This occurred on Friday night, leading to a sleepless night due to the intensity of the pain. The following morning, she felt a 'crack' in her foot when she moved it slightly, described as a central front sensation. Initially, she thought she had broken something or torn a ligament.  Following the 'pop' on Saturday, the pain subsided somewhat, but her foot swelled, and she was unable to flex it. The pain is particularly sore at the middle part of the foot, with tenderness at the anterior joint line and midfoot dorsal area. Bruising was reported to have appeared immediately after the incident but has since dissipated. No significant pain at the knee and no severe pain when pressure is applied to the side of the foot.  She has been using a previously provided short cam boot, which she describes as a 'lifesaver,' allowing her to walk her dog twice a day and manage daily activities with minimal pain. The boot significantly reduces pain, although she experiences tightness in her foot, particularly when not wearing the boot at home. She has had a few good days with moderate pain levels, but nothing as severe as the initial incident.  Her current medication regimen includes diclofenac, which she takes as needed for pain management. She has not taken many of them, as she feels they may not be effective, but she uses them on particularly rough days or when anticipating increased activity.  Physical Exam MUSCULOSKELETAL: Inspection with faint darkish discoloration consistent with resolving ecchymosis at the dorsal midfoot, associated minor swelling, full active range of motion in the foot without  pain. Resistant range of motion does not elicit pain with full strength. Non-tender at peroneals and posterior tibialis. Tenderness at anterior joint line and midfoot, dorsal.  Assessment and Plan Chronic left foot pain, acute exacerbation Acute exacerbation of chronic foot pain with a reported "pop" and subsequent swelling. Pain localized to the anterior joint line and midfoot dorsal. No significant findings on physical examination.  Clinical history, course, and current findings are most consistent with osteoarthritis related flare, can exclude an element of soft tissue/ligamentous involvement. -Order MRI of the foot to assess for possible soft tissue injury and evaluate the extent of arthritis. -Continue use of boot outside the house and during activities with increased risk of injury (e.g., walking the dog). Encourage natural foot use inside the house to prevent stiffness and weakness. -Continue pain management with current medication as needed. -Plan for follow-up communication after MRI results are available.

## 2023-03-24 NOTE — Patient Instructions (Signed)
 Chronic Left Foot Pain: Patient Instructions  1. MRI: Schedule an MRI of your foot to check for soft tissue injury and assess arthritis.  2. Boot Use: Wear the boot outside and during activities with higher injury risk (like walking the dog). At home, try to use your foot naturally to avoid stiffness and weakness.  3. Pain Management: Continue taking your current medication as needed for pain.  4. Follow-Up: We will contact you to discuss the results after your MRI.

## 2023-03-24 NOTE — Progress Notes (Signed)
 Primary Care / Sports Medicine Office Visit  Patient Information:  Patient ID: [name], female DOB: 01-21-1956 Age: 68 y.o. MRN: [id number]   [name] is a pleasant 68 y.o. female presenting with the following:  Chief Complaint  Patient presents with   Foot Pain    Patient heard a loud pop in her left foot on 03/15/23. She has been wearing her boot. On Saturday Feb 15 patient was lying down with her feet up on her couch. She twisted her foot heard a loud pop now her foot is dropping as she walks.     Vitals:   03/21/23 1338  BP: 110/70  Pulse: 80  SpO2: 96%   Vitals:   03/21/23 1338  Weight: 193 lb (87.5 kg)  Height: 5' 4.5" (1.638 m)   Body mass index is 32.62 kg/m.  DG Ankle Complete Left Result Date: 03/05/2023 CLINICAL DATA:  Pain and swelling for 1 month. Left foot and ankle fracture 4 years ago. EXAM: LEFT ANKLE COMPLETE - 3+ VIEW; LEFT FOOT - COMPLETE 3+ VIEW COMPARISON:  Left foot radiographs 02/12/2023, left ankle radiographs 05/20/2018 FINDINGS: Left ankle: There is mild sclerosis within the mid calcaneal body consistent with interval healing of the prior calcaneal body comminuted fracture seen on 05/20/2018 radiographs and 05/26/2018 CT. The ankle mortise is symmetric and intact. Small plantar and posterior calcaneal heel spurs. Left foot: Mild pes cavus, unchanged. Montanide second through fifth interphalangeal joint space narrowing. No acute fracture or dislocation. IMPRESSION: 1. Interval healing of the prior remote calcaneal body comminuted fracture seen on 05/20/2018 radiographs and 05/26/2018 CT. 2. Small plantar and posterior calcaneal heel spurs. 3. Mild pes cavus, unchanged. Electronically Signed   By: Neita Garnet M.D.   On: 03/05/2023 15:00   DG Foot Complete Left Result Date: 03/05/2023 CLINICAL DATA:  Pain and swelling for 1 month. Left foot and ankle fracture 4 years ago. EXAM: LEFT ANKLE COMPLETE - 3+ VIEW; LEFT FOOT - COMPLETE 3+ VIEW  COMPARISON:  Left foot radiographs 02/12/2023, left ankle radiographs 05/20/2018 FINDINGS: Left ankle: There is mild sclerosis within the mid calcaneal body consistent with interval healing of the prior calcaneal body comminuted fracture seen on 05/20/2018 radiographs and 05/26/2018 CT. The ankle mortise is symmetric and intact. Small plantar and posterior calcaneal heel spurs. Left foot: Mild pes cavus, unchanged. Montanide second through fifth interphalangeal joint space narrowing. No acute fracture or dislocation. IMPRESSION: 1. Interval healing of the prior remote calcaneal body comminuted fracture seen on 05/20/2018 radiographs and 05/26/2018 CT. 2. Small plantar and posterior calcaneal heel spurs. 3. Mild pes cavus, unchanged. Electronically Signed   By: Neita Garnet M.D.   On: 03/05/2023 15:00     Independent interpretation of notes and tests performed by another provider:   None  Procedures performed:   None  Pertinent History, Exam, Impression, and Recommendations:   Problem List Items Addressed This Visit     Osteoarthritis of left midfoot - Primary   History of Present Illness [name] is a 68 year old female who presents with acute on chronic foot pain following a popping sensation roughly 1 week prior.  She experienced acute foot pain following a popping sensation, which radiated up to her knee and felt like 'a dagger splitting up the front of my foot.' This occurred on Friday night, leading to a sleepless night due to the intensity of the pain. The following morning, she felt a 'crack' in her foot when  she moved it slightly, described as a central front sensation. Initially, she thought she had broken something or torn a ligament.  Following the 'pop' on Saturday, the pain subsided somewhat, but her foot swelled, and she was unable to flex it. The pain is particularly sore at the middle part of the foot, with tenderness at the anterior joint line and midfoot dorsal area.  Bruising was reported to have appeared immediately after the incident but has since dissipated. No significant pain at the knee and no severe pain when pressure is applied to the side of the foot.  She has been using a previously provided short cam boot, which she describes as a 'lifesaver,' allowing her to walk her dog twice a day and manage daily activities with minimal pain. The boot significantly reduces pain, although she experiences tightness in her foot, particularly when not wearing the boot at home. She has had a few good days with moderate pain levels, but nothing as severe as the initial incident.  Her current medication regimen includes diclofenac, which she takes as needed for pain management. She has not taken many of them, as she feels they may not be effective, but she uses them on particularly rough days or when anticipating increased activity.  Physical Exam MUSCULOSKELETAL: Inspection with faint darkish discoloration consistent with resolving ecchymosis at the dorsal midfoot, associated minor swelling, full active range of motion in the foot without pain. Resistant range of motion does not elicit pain with full strength. Non-tender at peroneals and posterior tibialis. Tenderness at anterior joint line and midfoot, dorsal.  Assessment and Plan Chronic left foot pain, acute exacerbation Acute exacerbation of chronic foot pain with a reported "pop" and subsequent swelling. Pain localized to the anterior joint line and midfoot dorsal. No significant findings on physical examination.  Clinical history, course, and current findings are most consistent with osteoarthritis related flare, can exclude an element of soft tissue/ligamentous involvement. -Order MRI of the foot to assess for possible soft tissue injury and evaluate the extent of arthritis. -Continue use of boot outside the house and during activities with increased risk of injury (e.g., walking the dog). Encourage natural foot use  inside the house to prevent stiffness and weakness. -Continue pain management with current medication as needed. -Plan for follow-up communication after MRI results are available.        Orders & Medications Medications: No orders of the defined types were placed in this encounter.  No orders of the defined types were placed in this encounter.    No follow-ups on file.     Jerrol Banana, MD, [facility]   Primary Care Sports Medicine Primary Care and Sports Medicine at [facility]

## 2023-03-30 ENCOUNTER — Encounter: Payer: Self-pay | Admitting: Internal Medicine

## 2023-04-05 DIAGNOSIS — G4733 Obstructive sleep apnea (adult) (pediatric): Secondary | ICD-10-CM | POA: Diagnosis not present

## 2023-04-14 ENCOUNTER — Encounter: Payer: Self-pay | Admitting: Internal Medicine

## 2023-04-15 ENCOUNTER — Other Ambulatory Visit: Payer: Self-pay | Admitting: Internal Medicine

## 2023-04-15 DIAGNOSIS — E118 Type 2 diabetes mellitus with unspecified complications: Secondary | ICD-10-CM

## 2023-04-15 MED ORDER — TIRZEPATIDE 5 MG/0.5ML ~~LOC~~ SOAJ: 5.0000 mg | SUBCUTANEOUS | 0 refills | Status: DC

## 2023-04-15 NOTE — Telephone Encounter (Signed)
 Please review.  KP

## 2023-04-23 ENCOUNTER — Encounter: Payer: Self-pay | Admitting: Pharmacist

## 2023-04-23 ENCOUNTER — Other Ambulatory Visit: Payer: Self-pay | Admitting: Pharmacist

## 2023-04-23 DIAGNOSIS — E118 Type 2 diabetes mellitus with unspecified complications: Secondary | ICD-10-CM

## 2023-04-23 NOTE — Progress Notes (Signed)
 04/23/2023 Name: ANELI ZARA MRN: [id number] DOB: 1955/10/12  Chief Complaint  Patient presents with   Medication Management   Medication Assistance    ANJANETTE GILKEY is a 68 y.o. year old female who presented for a telephone visit.   They were referred to the pharmacist by a quality report for assistance in managing diabetes and medication access.    Subjective:   Care Team: Primary Care Provider: Reubin Milan, MD ; Next Scheduled Visit: 05/21/2023    Medication Access/Adherence  Current Pharmacy:  [facility] - [place], [place] - [address] [place] [address] Phone: [phone] Fax: [phone]  [facility] [address]), [place] - [address] [place]) [place] [address] Phone: [phone] Fax: [phone]  [facility] [address] [place] [address] Phone: [phone] Fax: [phone]  [facility] - [place], [place] - [address] [place] [address] [place] [address] Phone: [phone] Fax: [phone]   Patient reports affordability concerns with their medications: No Patient reports access/transportation concerns to their pharmacy: No  Patient reports adherence concerns with their medications:  No       Diabetes:   Current medications:  - Farxiga 10 mg daily before breakfast - Mounjaro 5 mg weekly (reports increased to current dose on Monday, 3/24)   Medications tried in the past: metformin (unable to tolerate); Ozempic (nausea/GI symptoms)  Reports has noticed an improvement in appetite control since started [facility]   Patient using Freestyle Libre 3 Plus continuous glucose monitoring Date of Download: 04/23/23 % Time CGM is active: 95% Average Glucose: 140 mg/dL Glucose Management Indicator: 6.7%  Glucose Variability: 13.1% (goal <36%) Time in Goal:  - Time in range 70-180: 97% - Time above range:  3% - Time below range: 0%     Reports has made positive dietary changes, including: - stopped drinking sugary beverages  - using CGM as feedback on dietary choices   Statin therapy: pravastatin 10 mg twice weekly   Current medication access support: Enrolled in patient assistance for Farxiga from [employer] through 01/28/2024   Objective:  Lab Results  Component Value Date   HGBA1C 7.2 (A) 02/12/2023    Lab Results  Component Value Date   CREATININE 0.67 06/06/2022   BUN 18 06/06/2022   NA 141 06/06/2022   K 4.5 06/06/2022   CL 101 06/06/2022   CO2 22 06/06/2022    Lab Results  Component Value Date   CHOL 203 (H) 06/06/2022   HDL 50 06/06/2022   LDLCALC 124 (H) 06/06/2022   LDLDIRECT 161.1 11/08/2010   TRIG 165 (H) 06/06/2022   CHOLHDL 4.1 06/06/2022   BP Readings from Last 3 Encounters:  03/21/23 110/70  03/05/23 118/70  02/12/23 110/62   Pulse Readings from Last 3 Encounters:  03/21/23 80  03/05/23 84  02/12/23 76    Medications Reviewed Today     Reviewed by Manuela Neptune, RPH-CPP (Pharmacist) on 04/23/23 at 1048  Med List Status: <None>   Medication Order Taking? Sig Documenting Provider Last Dose Status Informant  Blood Glucose Monitoring Suppl DEVI [id number]  Use to test blood sugar twice daily. Reubin Milan, MD  Active   Cholecalciferol 25 MCG (1000 UT) TBDP [id number]  Take 1 tablet by mouth daily. [provider]  Active   Continuous Glucose Sensor (FREESTYLE LIBRE 3 PLUS SENSOR)  MISC [id number]  Place 1 sensor on the skin every 15 days. Use to check glucose continuously Reubin Milan, MD  Active   dapagliflozin propanediol (FARXIGA) 10 MG TABS tablet [id number] Yes Take 1 tablet (10 mg total) by mouth daily before breakfast. Reubin Milan, MD Taking Active   diclofenac (VOLTAREN) 75 MG EC tablet [id number]  Take 1 tablet (75 mg total) by mouth 2 (two) times daily. X 1 week then twice daily as-needed. Jerrol Banana, MD  Active    DULoxetine (CYMBALTA) 60 MG capsule [id number]  Take 2 capsules (120 mg total) by mouth daily. Reubin Milan, MD  Expired 04/09/23 2359   Glucose Blood (BLOOD GLUCOSE TEST STRIPS) STRP [id number]  Use to test blood sugar twice daily. Reubin Milan, MD  Active   glucose blood [facility] ULTRA) test strip [id number]  Use to test blood sugar twice a day Reubin Milan, MD  Active Self  Insulin Pen Needle (PEN NEEDLES) 31G X 5 MM MISC [id number]  1 each by Does not apply route once a week. Reubin Milan, MD  Active   Lancet Device MISC [id number]  Use to test blood sugar twice daily. Reubin Milan, MD  Active   losartan-hydrochlorothiazide [facility]) 100-12.5 MG tablet [id number]  Take 1 tablet by mouth daily. Reubin Milan, MD  Active   nebivolol (BYSTOLIC) 5 MG tablet [id number]  Take 1 tablet (5 mg total) by mouth daily. Reubin Milan, MD  Active   pravastatin (PRAVACHOL) 10 MG tablet [id number] Yes Take 1 tablet (10 mg total) by mouth 2 (two) times a week. Reubin Milan, MD Taking Active   tirzepatide [facility]) 5 MG/0.5ML Pen [id number] Yes Inject 5 mg into the skin once a week. Reubin Milan, MD Taking Active   vitamin B-12 (CYANOCOBALAMIN) 1000 MCG tablet [id number]  Take 1,000 mcg by mouth daily. [provider]  Active Self  zolpidem (AMBIEN) 10 MG tablet [id number]  Take 1 tablet (10 mg total) by mouth at bedtime as needed for sleep. Reubin Milan, MD  Active               Assessment/Plan:   Diabetes: - Reviewed long term cardiovascular and renal outcomes of uncontrolled blood sugar - Reviewed goal A1c, goal fasting, and goal 2 hour post prandial glucose - Reviewed dietary modifications including encourage patient to have regular well-balanced meals and snacks, while controlling carbohydrate portion sizes Discuss ideas for lower sugar/carbohydrate balanced snacks - Patient to contact [employer] patient assistance program as needed for refills of  Farxiga - Recommend to continue to use Freestyle Libre 3 CGM to monitor blood sugar/as feedback on dietary choices Recommend to check glucose with fingerstick check when needed for symptoms and as back up to CGM. Patient to contact office if needed for readings outside of established parameters or symptoms   Follow Up Plan: Clinical Pharmacist will follow up with patient by telephone again on 11/19/2023 at 10:00 AM    Estelle Grumbles, PharmD, [facility] [phone]

## 2023-04-23 NOTE — Patient Instructions (Signed)
 Goals Addressed             This Visit's Progress    Pharmacy Goals       If you need to reach out to patient assistance programs regarding refills or to find out the status of your application, you can do so by calling:  [employer] at [phone]  Our goal A1c is less than 7%. This corresponds with fasting sugars less than 130 and 2 hour after meal sugars less than 180. Please keep a log of your results when checking your blood sugar   Our goal bad cholesterol, or LDL, is less than 70. This is why it is important to continue taking your pravastatin  Thank you!  Estelle Grumbles, PharmD, [facility] [phone]

## 2023-05-01 ENCOUNTER — Other Ambulatory Visit: Payer: Self-pay | Admitting: Internal Medicine

## 2023-05-01 DIAGNOSIS — F5101 Primary insomnia: Secondary | ICD-10-CM

## 2023-05-05 ENCOUNTER — Ambulatory Visit: Payer: Self-pay

## 2023-05-05 NOTE — Telephone Encounter (Signed)
 Noted  Pt has a appt.  KP

## 2023-05-05 NOTE — Telephone Encounter (Signed)
 Copied from CRM [id number]. Topic: Clinical - Red Word Triage >> May 05, 2023 10:17 AM Franchot Heidelberg wrote: Red Word that prompted transfer to Nurse Triage: Rash spreading all over   Chief Complaint: rash Symptoms: little red spots size of tip of pen all over that bleed and scab when scratched, spots to neck and legs come and go, spots to stomach and back are constant, "tolerable" itching, some headaches "think pollen and weather" Frequency: continual, ebbs and flows Pertinent Negatives: Patient denies raised spots, blisters, swelling, SOB, purple/blood colored spots, bright red sunburn-like rash, fever, joint pain, severe itching, dizziness, sore throat, peeling Disposition: [] 911 / [] ED /[] Urgent Care (no appt availability in office) / [x] Appointment(In office/virtual)/ []  [place] [facility]/ [] Home Care/ [] Refused Recommended Disposition /[] [place] [facility]/ []  Follow-up with PCP Additional Notes: Pt reporting that she has had a widespread rash for a month, consisting of little red spots the size of the tip of a pen that bleed and scab when scratched, would otherwise "be smooth" if didn't scratch, not raised bumps, started on stomach then spread to her back, this past weekend the rash spread to her legs. Pt reporting that the rash comes and goes at her neck and legs but stays constant on stomach and back. Pt confirms she spoke with Dr. Judithann Graves over MyChart about rash, doc recommended examination, doc advised likely not reaction to [facility]. Pt confirms no fever, severe itching, SOB, or other symptoms. Advised pt be examined in next 3 days, scheduled with PCP office for Thursday per pt request. Advised pt call back if any worsening or new symptoms, advised can try hydrocortisone cream, oatmeal bath for itching. Pt verbalized understanding. Please advise.  Reason for Disposition  Mild widespread rash  (Exception: Heat rash lasting 3 days or less.)  Answer Assessment - Initial Assessment  Questions 1. APPEARANCE of RASH: "Describe the rash." (e.g., spots, blisters, raised areas, skin peeling, scaly)     Little red spots that because scratch them have scab sometimes, not raised, scabs because scratching, calms down and disappears goes away on legs and neck, but ones on the stomach and back don't go away 2. SIZE: "How big are the spots?" (e.g., tip of pen, eraser, coin; inches, centimeters)     Tip of pen 3. LOCATION: "Where is the rash located?"     Started on stomach, then was scratching back there but all over back, neck then disappears, spread to legs over the weekend 4. COLOR: "What color is the rash?" (Note: It is difficult to assess rash color in people with darker-colored skin. When this situation occurs, simply ask the caller to describe what they see.)     Looks like little moles, when scratch them they bleed, red 5. ONSET: "When did the rash begin?"     Started about a month ago 6. FEVER: "Do you have a fever?" If Yes, ask: "What is your temperature, how was it measured, and when did it start?"     no 7. ITCHING: "Does the rash itch?" If Yes, ask: "How bad is the itch?" (Scale 1-10; or mild, moderate, severe)     Itches, mainly at night or if get hot, tolerable 9. MEDICINE FACTORS: "Have you started any new medicines within the last 2 weeks?" (e.g., antibiotics)      Started mounjaro, told Dr. Judithann Graves with MyChart 10. OTHER SYMPTOMS: "Do you have any other symptoms?" (e.g., dizziness, headache, sore throat, joint pain)       Some headaches think  pollen and weather  Protocols used: Rash or Redness - [facility]

## 2023-05-06 DIAGNOSIS — G4733 Obstructive sleep apnea (adult) (pediatric): Secondary | ICD-10-CM | POA: Diagnosis not present

## 2023-05-08 ENCOUNTER — Encounter: Payer: Self-pay | Admitting: Family Medicine

## 2023-05-08 ENCOUNTER — Ambulatory Visit (INDEPENDENT_AMBULATORY_CARE_PROVIDER_SITE_OTHER): Admitting: Family Medicine

## 2023-05-08 ENCOUNTER — Encounter: Payer: Self-pay | Admitting: Internal Medicine

## 2023-05-08 VITALS — BP 132/82 | HR 74 | Resp 16 | Ht 64.5 in | Wt 186.0 lb

## 2023-05-08 DIAGNOSIS — R21 Rash and other nonspecific skin eruption: Secondary | ICD-10-CM

## 2023-05-08 MED ORDER — METHYLPREDNISOLONE 4 MG PO TBPK: ORAL_TABLET | ORAL | 0 refills | Status: DC

## 2023-05-08 MED ORDER — HYDROXYZINE PAMOATE 25 MG PO CAPS
25.0000 mg | ORAL_CAPSULE | Freq: Three times a day (TID) | ORAL | 0 refills | Status: DC | PRN
Start: 2023-05-08 — End: 2023-05-21

## 2023-05-08 NOTE — Assessment & Plan Note (Signed)
 History of Present Illness [name] is a 68 year old female who presents with a rash and itching. Her daughter confirmed the presence of the rash.  Approximately one month ago, she developed a rash initially on her stomach, accompanied by itching, which later spread to her back and legs. The rash consists of intensely pruritic bumps, particularly on her back and stomach, but not on her legs. The itching is most severe in the evening before bed.  She recently started taking Mounjaro about two months ago and suspects it might be related to the rash. Additionally, she used a new body wash, Dr. Celestia Khat, for about a month before discontinuing it a week ago, with no improvement noted since stopping the body wash. She has not started any new medications other than Mounjaro.  She has pets, including a dog and three cats, all of which sleep in her bed. The dog goes outside, but the cats do not. She uses unscented detergent and lotion due to sensitivity to scents and has not been exposed to new environmental factors such as new laundry detergents, lotions, or significant outdoor activities.  She has not tried any treatments for the rash except considering the use of Zyrtec for the itch.  Physical Exam nontoxic-appearing individual with scattered mildly erythematous nodular lesions on nonerythematous base primarily involving mid back torso, lower abdominal region, waistline, bilateral anterior lower legs.  Image of back provided by patient via MyChart on 03/30/2023  Assessment and Plan Pruritic body rash Pruritic rash with potential causes including Mounjaro, environmental allergens, or insect bites.  Upon EMR review, she appears to have had a similar presentation 09/28/2020.  Her primary concern is pruritus. Hydroxyzine prescribed for itch relief. Short course of steroids considered due to potential impact on glycemic control. Dermatology referral planned if symptoms persist. - Prescribe hydroxyzine for  pruritus, 1-2 capsules up to three times daily, with potential drowsiness as a side effect. - Advise use of topical diphenhydramine cream for localized pruritus control. - Prescribe a short course of prednisone as a backup if symptoms worsen, with caution due to potential impact on glycemic control. - Refer to dermatology for further evaluation if symptoms persist or worsen, prior to the given the history of recurrence/chronicity. - Instruct to avoid new exposures and maintain current good hygiene practices, including washing sheets and bathing pets.  Diabetes Management Diabetes well-managed with A1c of 6.7. Continuous glucose monitor in use. Steroids considered a last resort due to potential impact on glycemic control. - Continue current diabetes management regimen. - Monitor blood glucose levels regularly.

## 2023-05-08 NOTE — Progress Notes (Signed)
 Primary Care / Sports Medicine Office Visit  Patient Information:  Patient ID: [name], female DOB: [birth year] Age: 68 y.o. MRN: [id number]   [name] is a pleasant 68 y.o. female presenting with the following:  Chief Complaint  Patient presents with   Rash    Rash on back and torso x 45 days or longer. Dermatology could not get her in. New RX 2 months ago Mounjaro fyi-    Vitals:   05/08/23 1315  BP: 132/82  Pulse: 74  Resp: 16   Vitals:   05/08/23 1315  Weight: 186 lb (84.4 kg)  Height: 5' 4.5" (1.638 m)   Body mass index is 31.43 kg/m.  No results found.   Independent interpretation of notes and tests performed by another provider:   None  Procedures performed:   None  Pertinent History, Exam, Impression, and Recommendations:   Problem List Items Addressed This Visit     Rash of body - Primary   History of Present Illness [name] is a 68 year old female who presents with a rash and itching. Her daughter confirmed the presence of the rash.  Approximately one month ago, she developed a rash initially on her stomach, accompanied by itching, which later spread to her back and legs. The rash consists of intensely pruritic bumps, particularly on her back and stomach, but not on her legs. The itching is most severe in the evening before bed.  She recently started taking Mounjaro about two months ago and suspects it might be related to the rash. Additionally, she used a new body wash, Dr. Celestia Khat, for about a month before discontinuing it a week ago, with no improvement noted since stopping the body wash. She has not started any new medications other than Mounjaro.  She has pets, including a dog and three cats, all of which sleep in her bed. The dog goes outside, but the cats do not. She uses unscented detergent and lotion due to sensitivity to scents and has not been exposed to new environmental factors such as new laundry detergents,  lotions, or significant outdoor activities.  She has not tried any treatments for the rash except considering the use of Zyrtec for the itch.  Physical Exam nontoxic-appearing individual with scattered mildly erythematous nodular lesions on nonerythematous base primarily involving mid back torso, lower abdominal region, waistline, bilateral anterior lower legs.  Image of back provided by patient via MyChart on 03/30/2023  Assessment and Plan Pruritic body rash Pruritic rash with potential causes including Mounjaro, environmental allergens, or insect bites.  Upon EMR review, she appears to have had a similar presentation 09/28/2020.  Her primary concern is pruritus. Hydroxyzine prescribed for itch relief. Short course of steroids considered due to potential impact on glycemic control. Dermatology referral planned if symptoms persist. - Prescribe hydroxyzine for pruritus, 1-2 capsules up to three times daily, with potential drowsiness as a side effect. - Advise use of topical diphenhydramine cream for localized pruritus control. - Prescribe a short course of prednisone as a backup if symptoms worsen, with caution due to potential impact on glycemic control. - Refer to dermatology for further evaluation if symptoms persist or worsen, prior to the given the history of recurrence/chronicity. - Instruct to avoid new exposures and maintain current good hygiene practices, including washing sheets and bathing pets.  Diabetes Management Diabetes well-managed with A1c of 6.7. Continuous glucose monitor in use. Steroids considered a last resort due to potential impact on glycemic  control. - Continue current diabetes management regimen. - Monitor blood glucose levels regularly.      Relevant Medications   hydrOXYzine (VISTARIL) 25 MG capsule   methylPREDNISolone (MEDROL DOSEPAK) 4 MG TBPK tablet   Other Relevant Orders   Ambulatory referral to Dermatology     Orders & Medications Medications:  Meds  ordered this encounter  Medications   hydrOXYzine (VISTARIL) 25 MG capsule    Sig: Take 1-2 capsules (25-50 mg total) by mouth every 8 (eight) hours as needed for itching.    Dispense:  30 capsule    Refill:  0   methylPREDNISolone (MEDROL DOSEPAK) 4 MG TBPK tablet    Sig: Use as directed.    Dispense:  21 each    Refill:  0   Orders Placed This Encounter  Procedures   Ambulatory referral to Dermatology     No follow-ups on file.     Jerrol Banana, MD, [facility]   Primary Care Sports Medicine Primary Care and Sports Medicine at [facility]

## 2023-05-08 NOTE — Patient Instructions (Addendum)
 Patient Plan for Post-Visit Guidance  1. Pruritic (Itchy) Rash:    - Take hydroxyzine for itching: 1-2 capsules up to three times daily. Be aware that it may cause drowsiness.    - Apply topical diphenhydramine cream to areas with localized itching.    - Use a short course of prednisone only if symptoms worsen, and be cautious of potential effects on blood sugar levels.    - If symptoms persist or worsen, schedule an appointment with a dermatologist for further evaluation.    - Avoid new exposures that might cause irritation and maintain good hygiene practices, such as washing bed linens regularly and bathing pets.  2. Diabetes Management:    - Continue your current diabetes management plan.    - Regularly monitor your blood glucose levels to ensure they remain stable, especially if you need to use prednisone.  Please follow these steps and contact us if you have any questions or concerns.

## 2023-05-08 NOTE — Telephone Encounter (Signed)
 Please review.  KP

## 2023-05-09 ENCOUNTER — Other Ambulatory Visit: Payer: Self-pay | Admitting: Internal Medicine

## 2023-05-09 DIAGNOSIS — B379 Candidiasis, unspecified: Secondary | ICD-10-CM

## 2023-05-09 MED ORDER — FLUCONAZOLE 100 MG PO TABS: 100.0000 mg | ORAL_TABLET | Freq: Once | ORAL | 0 refills | Status: AC

## 2023-05-15 ENCOUNTER — Other Ambulatory Visit: Payer: Self-pay | Admitting: Internal Medicine

## 2023-05-15 ENCOUNTER — Ambulatory Visit
Admission: RE | Admit: 2023-05-15 | Discharge: 2023-05-15 | Disposition: A | Discharge status: Home / Self Care | Payer: PPO | Source: Ambulatory Visit | Attending: Internal Medicine | Admitting: Internal Medicine

## 2023-05-15 DIAGNOSIS — Z1231 Encounter for screening mammogram for malignant neoplasm of breast: Secondary | ICD-10-CM

## 2023-05-15 DIAGNOSIS — F331 Major depressive disorder, recurrent, moderate: Secondary | ICD-10-CM

## 2023-05-16 NOTE — Telephone Encounter (Signed)
 Med refill

## 2023-05-21 ENCOUNTER — Ambulatory Visit (INDEPENDENT_AMBULATORY_CARE_PROVIDER_SITE_OTHER): Payer: Self-pay | Admitting: Internal Medicine

## 2023-05-21 ENCOUNTER — Encounter: Payer: Self-pay | Admitting: Internal Medicine

## 2023-05-21 VITALS — BP 110/68 | HR 84 | Ht 64.5 in | Wt 187.0 lb

## 2023-05-21 DIAGNOSIS — E785 Hyperlipidemia, unspecified: Secondary | ICD-10-CM

## 2023-05-21 DIAGNOSIS — E1169 Type 2 diabetes mellitus with other specified complication: Secondary | ICD-10-CM | POA: Diagnosis not present

## 2023-05-21 DIAGNOSIS — F331 Major depressive disorder, recurrent, moderate: Secondary | ICD-10-CM

## 2023-05-21 DIAGNOSIS — I10 Essential (primary) hypertension: Secondary | ICD-10-CM

## 2023-05-21 DIAGNOSIS — Z7985 Long-term (current) use of injectable non-insulin antidiabetic drugs: Secondary | ICD-10-CM | POA: Diagnosis not present

## 2023-05-21 DIAGNOSIS — E118 Type 2 diabetes mellitus with unspecified complications: Secondary | ICD-10-CM | POA: Diagnosis not present

## 2023-05-21 DIAGNOSIS — R21 Rash and other nonspecific skin eruption: Secondary | ICD-10-CM

## 2023-05-21 DIAGNOSIS — E1142 Type 2 diabetes mellitus with diabetic polyneuropathy: Secondary | ICD-10-CM | POA: Diagnosis not present

## 2023-05-21 LAB — POCT GLYCOSYLATED HEMOGLOBIN (HGB A1C): Hemoglobin A1C: 6.3 % — AB (ref 4.0–5.6)

## 2023-05-21 MED ORDER — PREGABALIN 50 MG PO CAPS: 50.0000 mg | ORAL_CAPSULE | Freq: Every day | ORAL | 0 refills | Status: DC

## 2023-05-21 NOTE — Assessment & Plan Note (Signed)
 Clinically stable on Cymbalta .   No SI or HI on evaluation. It is not helping her peripheral neuropathy however. Plan to continue same medications for now.

## 2023-05-21 NOTE — Assessment & Plan Note (Signed)
 LDL is  Lab Results  Component Value Date   LDLCALC 124 (H) 06/06/2022   Current regimen is pravastatin  twice a week.  No medication side effects noted. Goal LDL is <70.

## 2023-05-21 NOTE — Assessment & Plan Note (Addendum)
 Blood sugars have been stable.  No recent hypoglycemic events requiring assistance. Currently medications are Mounjaro 5 mg and Farxiga  Lab Results  Component Value Date   HGBA1C 6.3 (A) 05/21/2023   Last visit changed to Mounjaro and she is tolerating it well. Will continue same dose for now.

## 2023-05-21 NOTE — Progress Notes (Signed)
 Date:  05/21/2023   Name:  [name]   DOB:  Mar 31, 1955   MRN:  [id number]   Chief Complaint: Hypertension, Diabetes, and Rash (Seen dr. Augustus Ledger about it, still itchy, its on her back and now on her legs)  Diabetes She presents for her follow-up diabetic visit. She has type 2 diabetes mellitus. Her disease course has been stable. Pertinent negatives for hypoglycemia include no dizziness, headaches or nervousness/anxiousness. Pertinent negatives for diabetes include no chest pain and no fatigue. Current diabetic treatment includes oral agent (dual therapy). Eye exam is current (had appt this week).  Hypertension This is a chronic problem. The problem is controlled. Pertinent negatives include no chest pain, headaches or shortness of breath. Past treatments include angiotensin blockers and diuretics. The current treatment provides significant improvement.  Hyperlipidemia This is a chronic problem. Pertinent negatives include no chest pain or shortness of breath. Current antihyperlipidemic treatment includes statins (twice a week).  Rash This is a new problem. The current episode started 1 to 4 weeks ago. The problem has been gradually improving since onset. The affected locations include the abdomen and back. The rash is characterized by redness and itchiness. She was exposed to a new detergent/soap. Pertinent negatives include no diarrhea, fatigue, fever or shortness of breath. Past treatments include moisturizer and topical steroids (did not take the steroid taper prescribed on previous visit.).    Review of Systems  Constitutional:  Negative for chills, fatigue and fever.  Respiratory:  Negative for chest tightness and shortness of breath.   Cardiovascular:  Negative for chest pain and leg swelling.  Gastrointestinal:  Negative for abdominal pain, constipation and diarrhea.  Skin:  Positive for rash.  Neurological:  Positive for numbness (and burning pain in both feet). Negative  for dizziness and headaches.  Psychiatric/Behavioral:  Negative for dysphoric mood and sleep disturbance. The patient is not nervous/anxious.      Lab Results  Component Value Date   NA 141 06/06/2022   K 4.5 06/06/2022   CO2 22 06/06/2022   GLUCOSE 119 (H) 06/06/2022   BUN 18 06/06/2022   CREATININE 0.67 06/06/2022   CALCIUM 10.0 06/06/2022   EGFR 96 06/06/2022   GFRNONAA >60 10/11/2020   Lab Results  Component Value Date   CHOL 203 (H) 06/06/2022   HDL 50 06/06/2022   LDLCALC 124 (H) 06/06/2022   LDLDIRECT 161.1 11/08/2010   TRIG 165 (H) 06/06/2022   CHOLHDL 4.1 06/06/2022   Lab Results  Component Value Date   TSH 1.460 06/06/2022   Lab Results  Component Value Date   HGBA1C 6.3 (A) 05/21/2023   Lab Results  Component Value Date   WBC 7.9 06/06/2022   HGB 12.5 06/06/2022   HCT 41.3 06/06/2022   MCV 82 06/06/2022   PLT 325 06/06/2022   Lab Results  Component Value Date   ALT 40 (H) 06/06/2022   AST 37 06/06/2022   ALKPHOS 135 (H) 06/06/2022   BILITOT 0.4 06/06/2022   Lab Results  Component Value Date   VD25OH 30.2 06/06/2022     Patient Active Problem List   Diagnosis Date Noted   Diabetic peripheral neuropathy (HCC) 05/21/2023   Osteoarthritis of left midfoot 03/05/2023   Peroneal tendinitis, left 03/05/2023   Adenomatous polyp of colon 06/20/2022   Screening for colon cancer 06/20/2022   Anemia 11/08/2021   Rash of body 09/28/2020   S/P hysterectomy 02/09/2020   Moderate episode of recurrent major depressive disorder (HCC) 02/09/2020  Neuropathy 07/30/2019   Osteopenia determined by x-ray 04/26/2019   Hyperlipidemia associated with type 2 diabetes mellitus (HCC) 07/15/2017   Type II diabetes mellitus with complication (HCC) 04/18/2017   Vitamin D  deficiency 04/18/2017   OSA on CPAP 02/28/2015   Obesity 02/28/2015   Hemorrhoids 03/04/2012   Insomnia 03/04/2012   Arthralgia 03/04/2012   Migraine without aura or status migrainosus 03/04/2012    Arthralgia of right temporomandibular joint 03/04/2012   Essential hypertension 11/08/2010    Allergies  Allergen Reactions   Amlodipine  Swelling    And headache    Past Surgical History:  Procedure Laterality Date   ABDOMINAL HYSTERECTOMY  2008   for Menorrhagia   BREAST REDUCTION SURGERY     COLONOSCOPY     COLONOSCOPY WITH PROPOFOL  N/A 06/20/2022   Procedure: COLONOSCOPY WITH PROPOFOL ;  Surgeon: Luke Salaam, MD;  Location: [facility] ENDOSCOPY;  Service: Gastroenterology;  Laterality: N/A;   LAPAROSCOPY     OPEN REDUCTION INTERNAL FIXATION (ORIF) DISTAL RADIAL FRACTURE Right 10/12/2020   Procedure: OPEN REDUCTION INTERNAL FIXATION (ORIF) DISTAL RADIAL FRACTURE;  Surgeon: Molli Angelucci, MD;  Location: [facility] ORS;  Service: Orthopedics;  Laterality: Right;   REDUCTION MAMMAPLASTY Bilateral 1991    Social History   Tobacco Use   Smoking status: Never   Smokeless tobacco: Never  Vaping Use   Vaping status: Never Used  Substance Use Topics   Alcohol use: Not Currently    Alcohol/week: 2.0 standard drinks of alcohol    Types: 2 Glasses of wine per week   Drug use: Never     Medication list has been reviewed and updated.  Current Meds  Medication Sig   Blood Glucose Monitoring Suppl DEVI Use to test blood sugar twice daily.   Cholecalciferol 25 MCG (1000 UT) TBDP Take 1 tablet by mouth daily.   Continuous Glucose Sensor (FREESTYLE LIBRE 3 PLUS SENSOR) MISC Place 1 sensor on the skin every 15 days. Use to check glucose continuously   dapagliflozin  propanediol (FARXIGA ) 10 MG TABS tablet Take 1 tablet (10 mg total) by mouth daily before breakfast.   diclofenac  (VOLTAREN ) 75 MG EC tablet Take 1 tablet (75 mg total) by mouth 2 (two) times daily. X 1 week then twice daily as-needed.   DULoxetine  (CYMBALTA ) 60 MG capsule Take 2 capsules (120 mg total) by mouth daily.   Glucose Blood (BLOOD GLUCOSE TEST STRIPS) STRP Use to test blood sugar twice daily.   glucose blood (ONETOUCH ULTRA)  test strip Use to test blood sugar twice a day   Insulin  Pen Needle (PEN NEEDLES) 31G X 5 MM MISC 1 each by Does not apply route once a week.   Lancet Device MISC Use to test blood sugar twice daily.   losartan -hydrochlorothiazide (HYZAAR) 100-12.5 MG tablet Take 1 tablet by mouth daily.   nebivolol  (BYSTOLIC ) 5 MG tablet Take 1 tablet (5 mg total) by mouth daily.   pravastatin  (PRAVACHOL ) 10 MG tablet Take 1 tablet (10 mg total) by mouth 2 (two) times a week.   pregabalin  (LYRICA ) 50 MG capsule Take 1 capsule (50 mg total) by mouth at bedtime.   tirzepatide (MOUNJARO) 5 MG/0.5ML Pen Inject 5 mg into the skin once a week.   vitamin B-12 (CYANOCOBALAMIN ) 1000 MCG tablet Take 1,000 mcg by mouth daily.   zolpidem  (AMBIEN ) 10 MG tablet Take 1 tablet (10 mg total) by mouth at bedtime as needed for sleep.       05/21/2023    3:02 PM 02/12/2023  1:34 PM 10/11/2022    3:18 PM 06/06/2022   10:43 AM  GAD 7 : Generalized Anxiety Score  Nervous, Anxious, on Edge 1 1 1 3   Control/stop worrying 0 1 1 3   Worry too much - different things 1 1 1 3   Trouble relaxing 1 0 1 3  Restless 0 0 1 0  Easily annoyed or irritable 1 2 2 3   Afraid - awful might happen 0 0 0 2  Total GAD 7 Score 4 5 7 17   Anxiety Difficulty Not difficult at all Somewhat difficult Not difficult at all        05/21/2023    3:01 PM 02/12/2023    1:33 PM 10/11/2022    3:18 PM  Depression screen PHQ 2/9  Decreased Interest 1 3 1   Down, Depressed, Hopeless 1 1 1   PHQ - 2 Score 2 4 2   Altered sleeping 3 3 3   Tired, decreased energy 3 3 3   Change in appetite 1 3 2   Feeling bad or failure about yourself  1 1 1   Trouble concentrating 2 3 2   Moving slowly or fidgety/restless 0 0 0  Suicidal thoughts 0 0 0  PHQ-9 Score 12 17 13   Difficult doing work/chores  Very difficult Somewhat difficult    BP Readings from Last 3 Encounters:  05/21/23 110/68  05/08/23 132/82  03/21/23 110/70    Physical Exam Vitals and nursing note  reviewed.  Constitutional:      General: She is not in acute distress.    Appearance: Normal appearance. She is well-developed.  HENT:     Head: Normocephalic and atraumatic.  Cardiovascular:     Rate and Rhythm: Normal rate and regular rhythm.  Pulmonary:     Effort: Pulmonary effort is normal. No respiratory distress.     Breath sounds: No wheezing or rhonchi.  Musculoskeletal:     Cervical back: Normal range of motion.  Skin:    General: Skin is warm and dry.     Findings: Rash present.     Comments: Scattered punctate lesions with evidence of excoriation - appear improved from previous images.  Neurological:     Mental Status: She is alert and oriented to person, place, and time.  Psychiatric:        Mood and Affect: Mood normal.        Behavior: Behavior normal.    Diabetic Foot Exam - Simple   Simple Foot Form Diabetic Foot exam was performed with the following findings: Yes 05/21/2023  4:01 PM  Visual Inspection No deformities, no ulcerations, no other skin breakdown bilaterally: Yes Sensation Testing See comments: Yes Pulse Check Posterior Tibialis and Dorsalis pulse intact bilaterally: Yes Comments Decreased sensation bilateral distal feet      Wt Readings from Last 3 Encounters:  05/21/23 187 lb (84.8 kg)  05/08/23 186 lb (84.4 kg)  03/21/23 193 lb (87.5 kg)    BP 110/68   Pulse 84   Ht 5' 4.5" (1.638 m)   Wt 187 lb (84.8 kg)   SpO2 98%   BMI 31.60 kg/m   Assessment and Plan:  Problem List Items Addressed This Visit       Unprioritized   Essential hypertension (Chronic)   Blood pressure is well controlled.  Current medications losartan  hct. Will continue same regimen along with efforts to limit dietary sodium.       Type II diabetes mellitus with complication (HCC) - Primary (Chronic)   Blood sugars have been stable.  No recent hypoglycemic events requiring assistance. Currently medications are Mounjaro 5 mg and Farxiga  Lab Results   Component Value Date   HGBA1C 6.3 (A) 05/21/2023   Last visit changed to Mounjaro and she is tolerating it well. Will continue same dose for now.       Relevant Orders   POCT glycosylated hemoglobin (Hb A1C) (Completed)   Hyperlipidemia associated with type 2 diabetes mellitus (HCC)   LDL is  Lab Results  Component Value Date   LDLCALC 124 (H) 06/06/2022   Current regimen is pravastatin  twice a week.  No medication side effects noted. Goal LDL is <70.       Moderate episode of recurrent major depressive disorder (HCC) (Chronic)   Clinically stable on Cymbalta .   No SI or HI on evaluation. It is not helping her peripheral neuropathy however. Plan to continue same medications for now.       Diabetic peripheral neuropathy (HCC)   Failed gabapentin in the past due to sedation Will start with low dose Lyrica  qpm and increase dose if beneficial and tolerated.      Relevant Medications   pregabalin  (LYRICA ) 50 MG capsule   Other Visit Diagnoses       Rash       resolving - use Vistaril  at HS for itching can take allegra or claritin bid as needed     Long-term current use of injectable noninsulin antidiabetic medication           Return in about 3 months (around 08/20/2023) for CPX.    Sheron Dixons, MD [facility] [place]

## 2023-05-21 NOTE — Patient Instructions (Signed)
 For itching take Allegra or Claritin twice a day. Continue Vistaril  at bedtime if needed

## 2023-05-21 NOTE — Assessment & Plan Note (Signed)
 Blood pressure is well controlled.  Current medications losartan  hct. Will continue same regimen along with efforts to limit dietary sodium.

## 2023-05-21 NOTE — Assessment & Plan Note (Signed)
 Failed gabapentin in the past due to sedation Will start with low dose Lyrica  qpm and increase dose if beneficial and tolerated.

## 2023-05-22 LAB — HM DIABETES EYE EXAM

## 2023-06-02 ENCOUNTER — Other Ambulatory Visit: Payer: Self-pay | Admitting: Internal Medicine

## 2023-06-02 ENCOUNTER — Other Ambulatory Visit: Payer: Self-pay | Admitting: Physician Assistant

## 2023-06-02 DIAGNOSIS — F5101 Primary insomnia: Secondary | ICD-10-CM

## 2023-06-02 MED ORDER — ZOLPIDEM TARTRATE 10 MG PO TABS: 10.0000 mg | ORAL_TABLET | Freq: Every evening | ORAL | 2 refills | Status: DC | PRN

## 2023-06-02 NOTE — Telephone Encounter (Signed)
 Copied from CRM [id number]. Topic: Clinical - Medication Refill >> Jun 02, 2023  3:45 PM Hamp Levine R wrote: Most Recent Primary Care Visit:  Provider: Sheron Dixons  Department: [facility]  Visit Type: OFFICE VISIT  Date: 05/21/2023  Medication: zolpidem  (AMBIEN ) 10 MG tablet  Has the patient contacted their pharmacy? Yes (Agent: If no, request that the patient contact the pharmacy for the refill. If patient does not wish to contact the pharmacy document the reason why and proceed with request.) (Agent: If yes, when and what did the pharmacy advise?)  Is this the correct pharmacy for this prescription? Yes If no, delete pharmacy and type the correct one.  This is the patient's preferred pharmacy:  [facility] - [place], [place] - [address] [place] [address] Phone: [phone] Fax: [phone]  Has the prescription been filled recently? No  Is the patient out of the medication? Yes  Has the patient been seen for an appointment in the last year OR does the patient have an upcoming appointment? Yes  Can we respond through MyChart? Yes  Agent: Please be advised that Rx refills may take up to 3 business days. We ask that you follow-up with your pharmacy.

## 2023-06-02 NOTE — Telephone Encounter (Signed)
 Please review

## 2023-06-03 NOTE — Telephone Encounter (Signed)
 Requested medications are due for refill today.  no  Requested medications are on the active medications list.  yes  Last refill. yesterday  Future visit scheduled.   yes  Notes to clinic.  Refusal not delegated.    Requested Prescriptions  Pending Prescriptions Disp Refills   zolpidem  (AMBIEN ) 10 MG tablet [Pharmacy Med Name: ZOLPIDEM  TARTRATE 10 MG TABLET] 30 tablet 0    Sig: Take 1 tablet (10 mg total) by mouth at bedtime as needed for sleep.     Not Delegated - Psychiatry:  Anxiolytics/Hypnotics Failed - 06/03/2023  5:02 PM      Failed - This refill cannot be delegated      Failed - Urine Drug Screen completed in last 360 days      Passed - Valid encounter within last 6 months    Recent Outpatient Visits           1 week ago Type II diabetes mellitus with complication [facility])   [place] [facility] at [facility], Chales [place], MD   3 weeks ago Rash of body   [place] [facility] at [facility], Dessie Flow, MD   2 months ago Osteoarthritis of left midfoot   [facility] at [facility], Dessie Flow, MD   3 months ago Osteoarthritis of left midfoot   [facility] at [facility], Dessie Flow, MD       Future Appointments             In 2 months Gala Jubilee, Chales [place], MD [facility] at [facility], [facility]

## 2023-06-05 DIAGNOSIS — G4733 Obstructive sleep apnea (adult) (pediatric): Secondary | ICD-10-CM | POA: Diagnosis not present

## 2023-06-27 ENCOUNTER — Other Ambulatory Visit: Payer: Self-pay | Admitting: Internal Medicine

## 2023-06-27 DIAGNOSIS — I10 Essential (primary) hypertension: Secondary | ICD-10-CM

## 2023-06-30 ENCOUNTER — Other Ambulatory Visit: Payer: Self-pay | Admitting: Internal Medicine

## 2023-06-30 DIAGNOSIS — E1142 Type 2 diabetes mellitus with diabetic polyneuropathy: Secondary | ICD-10-CM

## 2023-06-30 MED ORDER — PREGABALIN 50 MG PO CAPS: 50.0000 mg | ORAL_CAPSULE | Freq: Every day | ORAL | 0 refills | Status: DC

## 2023-06-30 NOTE — Telephone Encounter (Signed)
 Copied from CRM [id number]. Topic: Clinical - Medication Refill >> Jun 30, 2023  9:30 AM Oddis Bench wrote: Medication: pregabalin  (LYRICA ) 50 MG capsule  Has the patient contacted their pharmacy? Yes Refill has not been sent  This is the patient's preferred pharmacy:  [facility] - [place], [place] - [address] [place] [address] Phone: [phone] Fax: [phone]    Is this the correct pharmacy for this prescription? Yes If no, delete pharmacy and type the correct one.   Has the prescription been filled recently? Yes  Is the patient out of the medication? Yes  Has the patient been seen for an appointment in the last year OR does the patient have an upcoming appointment? Yes  Can we respond through MyChart? No  Agent: Please be advised that Rx refills may take up to 3 business days. We ask that you follow-up with your pharmacy.

## 2023-07-01 ENCOUNTER — Telehealth: Admitting: Nurse Practitioner

## 2023-07-01 ENCOUNTER — Encounter

## 2023-07-01 DIAGNOSIS — T3695XA Adverse effect of unspecified systemic antibiotic, initial encounter: Secondary | ICD-10-CM | POA: Diagnosis not present

## 2023-07-01 DIAGNOSIS — S20162A Insect bite (nonvenomous) of breast, left breast, initial encounter: Secondary | ICD-10-CM

## 2023-07-01 DIAGNOSIS — W57XXXA Bitten or stung by nonvenomous insect and other nonvenomous arthropods, initial encounter: Secondary | ICD-10-CM | POA: Diagnosis not present

## 2023-07-01 DIAGNOSIS — R6889 Other general symptoms and signs: Secondary | ICD-10-CM | POA: Diagnosis not present

## 2023-07-01 DIAGNOSIS — B379 Candidiasis, unspecified: Secondary | ICD-10-CM

## 2023-07-01 MED ORDER — FLUCONAZOLE 150 MG PO TABS: ORAL_TABLET | ORAL | 1 refills | Status: DC

## 2023-07-01 MED ORDER — DOXYCYCLINE HYCLATE 100 MG PO TABS: 100.0000 mg | ORAL_TABLET | Freq: Two times a day (BID) | ORAL | 0 refills | Status: AC

## 2023-07-01 NOTE — Progress Notes (Signed)
 Virtual Visit Consent   [name], you are scheduled for a virtual visit with a [facility] provider today. Just as with appointments in the office, your consent must be obtained to participate. Your consent will be active for this visit and any virtual visit you may have with one of our providers in the next 365 days. If you have a MyChart account, a copy of this consent can be sent to you electronically.  As this is a virtual visit, video technology does not allow for your provider to perform a traditional examination. This may limit your provider's ability to fully assess your condition. If your provider identifies any concerns that need to be evaluated in person or the need to arrange testing (such as labs, EKG, etc.), we will make arrangements to do so. Although advances in technology are sophisticated, we cannot ensure that it will always work on either your end or our end. If the connection with a video visit is poor, the visit may have to be switched to a telephone visit. With either a video or telephone visit, we are not always able to ensure that we have a secure connection.  By engaging in this virtual visit, you consent to the provision of healthcare and authorize for your insurance to be billed (if applicable) for the services provided during this visit. Depending on your insurance coverage, you may receive a charge related to this service.  I need to obtain your verbal consent now. Are you willing to proceed with your visit today? [name] has provided verbal consent on 07/01/2023 for a virtual visit (video or telephone). Mardene Shake, FNP  Date: 07/01/2023 6:06 PM   Virtual Visit via Video Note   I, Mardene Shake, connected with  [name]  ([id number], February 17, 1955) on 07/01/23 at  6:15 PM EDT by a video-enabled telemedicine application and verified that I am speaking with the correct person using two identifiers.  Location: Patient: Virtual Visit Location Patient:  Home Provider: Virtual Visit Location Provider: Home Office   I discussed the limitations of evaluation and management by telemedicine and the availability of in person appointments. The patient expressed understanding and agreed to proceed.    History of Present Illness: [name] is a 68 y.o. who identifies as a female who was assigned female at birth, and is being seen today for flu like symptoms that started after she had a tick bite.   She first noted that she had a tick on her 06/26/23. That day she started feeling like she had flu like symptoms. She has had body aches, low grade fever, fatigue, nausea.   She was able to remove the tick. The area she removed the tick from is red without a rash.   She believes it was deer tick    Problems:  Patient Active Problem List   Diagnosis Date Noted   Diabetic peripheral neuropathy (HCC) 05/21/2023   Osteoarthritis of left midfoot 03/05/2023   Peroneal tendinitis, left 03/05/2023   Adenomatous polyp of colon 06/20/2022   Screening for colon cancer 06/20/2022   Anemia 11/08/2021   Rash of body 09/28/2020   S/P hysterectomy 02/09/2020   Moderate episode of recurrent major depressive disorder (HCC) 02/09/2020   Neuropathy 07/30/2019   Osteopenia determined by x-ray 04/26/2019   Hyperlipidemia associated with type 2 diabetes mellitus (HCC) 07/15/2017   Type II diabetes mellitus with complication (HCC) 04/18/2017   Vitamin D  deficiency 04/18/2017   OSA on CPAP 02/28/2015  Obesity 02/28/2015   Hemorrhoids 03/04/2012   Insomnia 03/04/2012   Arthralgia 03/04/2012   Migraine without aura or status migrainosus 03/04/2012   Arthralgia of right temporomandibular joint 03/04/2012   Essential hypertension 11/08/2010    Allergies:  Allergies  Allergen Reactions   Amlodipine  Swelling    And headache   Medications:  Current Outpatient Medications:    Blood Glucose Monitoring Suppl DEVI, Use to test blood sugar twice daily., Disp:  1 each, Rfl: 0   Cholecalciferol 25 MCG (1000 UT) TBDP, Take 1 tablet by mouth daily., Disp: , Rfl:    Continuous Glucose Sensor (FREESTYLE LIBRE 3 PLUS SENSOR) MISC, Place 1 sensor on the skin every 15 days. Use to check glucose continuously, Disp: 2 each, Rfl: 12   dapagliflozin  propanediol (FARXIGA ) 10 MG TABS tablet, Take 1 tablet (10 mg total) by mouth daily before breakfast., Disp: 90 tablet, Rfl: 3   diclofenac  (VOLTAREN ) 75 MG EC tablet, Take 1 tablet (75 mg total) by mouth 2 (two) times daily. X 1 week then twice daily as-needed., Disp: 60 tablet, Rfl: 0   DULoxetine  (CYMBALTA ) 60 MG capsule, Take 2 capsules (120 mg total) by mouth daily., Disp: 180 capsule, Rfl: 0   Glucose Blood (BLOOD GLUCOSE TEST STRIPS) STRP, Use to test blood sugar twice daily., Disp: 100 strip, Rfl: 12   glucose blood (ONETOUCH ULTRA) test strip, Use to test blood sugar twice a day, Disp: 100 each, Rfl: 12   Insulin  Pen Needle (PEN NEEDLES) 31G X 5 MM MISC, 1 each by Does not apply route once a week., Disp: 30 each, Rfl: 0   Lancet Device MISC, Use to test blood sugar twice daily., Disp: 1 each, Rfl: 0   losartan -hydrochlorothiazide (HYZAAR) 100-12.5 MG tablet, Take 1 tablet by mouth daily., Disp: 90 tablet, Rfl: 2   nebivolol  (BYSTOLIC ) 5 MG tablet, Take 1 tablet (5 mg total) by mouth daily., Disp: 100 tablet, Rfl: 1   pravastatin  (PRAVACHOL ) 10 MG tablet, Take 1 tablet (10 mg total) by mouth 2 (two) times a week., Disp: 24 tablet, Rfl: 1   pregabalin  (LYRICA ) 50 MG capsule, Take 1 capsule (50 mg total) by mouth at bedtime., Disp: 90 capsule, Rfl: 0   tirzepatide (MOUNJARO) 5 MG/0.5ML Pen, Inject 5 mg into the skin once a week., Disp: 6 mL, Rfl: 0   vitamin B-12 (CYANOCOBALAMIN ) 1000 MCG tablet, Take 1,000 mcg by mouth daily., Disp: , Rfl:    zolpidem  (AMBIEN ) 10 MG tablet, Take 1 tablet (10 mg total) by mouth at bedtime as needed for sleep., Disp: 30 tablet, Rfl: 2  Observations/Objective: Patient is  well-developed, well-nourished in no acute distress.  Resting comfortably  at home.  Head is normocephalic, atraumatic.  No labored breathing.  Speech is clear and coherent with logical content.  Patient is alert and oriented at baseline.    Assessment and Plan:  1. Flu-like symptoms  Follow up with PCP in the next week for lab work as discussed   2. Tick bite of left breast, initial encounter  Meds ordered this encounter  Medications   doxycycline (VIBRA-TABS) 100 MG tablet    Sig: Take 1 tablet (100 mg total) by mouth 2 (two) times daily for 14 days.    Dispense:  28 tablet    Refill:  0   fluconazole  (DIFLUCAN ) 150 MG tablet    Sig: Take one tablet for symptoms of yeast infection. May repeat after 72 hours if needed    Dispense:  2  tablet    Refill:  1     Follow Up Instructions: I discussed the assessment and treatment plan with the patient. The patient was provided an opportunity to ask questions and all were answered. The patient agreed with the plan and demonstrated an understanding of the instructions.  A copy of instructions were sent to the patient via MyChart unless otherwise noted below.    The patient was advised to call back or seek an in-person evaluation if the symptoms worsen or if the condition fails to improve as anticipated.    Mardene Shake, FNP

## 2023-07-02 ENCOUNTER — Telehealth: Payer: Self-pay

## 2023-07-02 NOTE — Telephone Encounter (Signed)
 Please review.  KP  Copied from CRM [id number]. Topic: Clinical - Request for Lab/Test Order >> Jul 02, 2023  8:37 AM Crispin Dolphin wrote: Reason for CRM: Patient called. Was seen virtually yesterday for tick bite. States provider requested she get labs done today. Would like to see if PCP can put order in for that. States yesterday was 5th day and virtual care provider said it should be done within 5 days. Thank You

## 2023-07-02 NOTE — Telephone Encounter (Signed)
 Spoke to pt let her know she didn't need any labs. Pt verbalized understanding.  KP

## 2023-07-06 DIAGNOSIS — G4733 Obstructive sleep apnea (adult) (pediatric): Secondary | ICD-10-CM | POA: Diagnosis not present

## 2023-07-07 ENCOUNTER — Other Ambulatory Visit: Payer: Self-pay

## 2023-07-30 DIAGNOSIS — M4317 Spondylolisthesis, lumbosacral region: Secondary | ICD-10-CM | POA: Diagnosis not present

## 2023-07-30 DIAGNOSIS — M51362 Other intervertebral disc degeneration, lumbar region with discogenic back pain and lower extremity pain: Secondary | ICD-10-CM | POA: Diagnosis not present

## 2023-07-30 DIAGNOSIS — Z6833 Body mass index (BMI) 33.0-33.9, adult: Secondary | ICD-10-CM | POA: Diagnosis not present

## 2023-07-30 DIAGNOSIS — G8929 Other chronic pain: Secondary | ICD-10-CM | POA: Diagnosis not present

## 2023-07-30 DIAGNOSIS — M5442 Lumbago with sciatica, left side: Secondary | ICD-10-CM | POA: Diagnosis not present

## 2023-07-30 DIAGNOSIS — M5441 Lumbago with sciatica, right side: Secondary | ICD-10-CM | POA: Diagnosis not present

## 2023-07-30 DIAGNOSIS — M4807 Spinal stenosis, lumbosacral region: Secondary | ICD-10-CM | POA: Diagnosis not present

## 2023-07-30 DIAGNOSIS — E119 Type 2 diabetes mellitus without complications: Secondary | ICD-10-CM | POA: Diagnosis not present

## 2023-07-30 DIAGNOSIS — M48062 Spinal stenosis, lumbar region with neurogenic claudication: Secondary | ICD-10-CM | POA: Diagnosis not present

## 2023-08-05 DIAGNOSIS — G4733 Obstructive sleep apnea (adult) (pediatric): Secondary | ICD-10-CM | POA: Diagnosis not present

## 2023-08-06 ENCOUNTER — Encounter: Payer: Self-pay | Admitting: Orthopedic Surgery

## 2023-08-14 ENCOUNTER — Other Ambulatory Visit: Payer: Self-pay | Admitting: Orthopedic Surgery

## 2023-08-14 DIAGNOSIS — M4317 Spondylolisthesis, lumbosacral region: Secondary | ICD-10-CM

## 2023-08-14 DIAGNOSIS — M51362 Other intervertebral disc degeneration, lumbar region with discogenic back pain and lower extremity pain: Secondary | ICD-10-CM

## 2023-08-14 DIAGNOSIS — G8929 Other chronic pain: Secondary | ICD-10-CM

## 2023-08-14 DIAGNOSIS — M48062 Spinal stenosis, lumbar region with neurogenic claudication: Secondary | ICD-10-CM

## 2023-08-14 DIAGNOSIS — M4807 Spinal stenosis, lumbosacral region: Secondary | ICD-10-CM

## 2023-08-15 ENCOUNTER — Ambulatory Visit
Admission: RE | Admit: 2023-08-15 | Discharge: 2023-08-15 | Disposition: A | Discharge status: Home / Self Care | Source: Ambulatory Visit | Attending: Orthopedic Surgery | Admitting: Orthopedic Surgery

## 2023-08-15 DIAGNOSIS — G8929 Other chronic pain: Secondary | ICD-10-CM

## 2023-08-15 DIAGNOSIS — M4807 Spinal stenosis, lumbosacral region: Secondary | ICD-10-CM

## 2023-08-15 DIAGNOSIS — M4316 Spondylolisthesis, lumbar region: Secondary | ICD-10-CM | POA: Diagnosis not present

## 2023-08-15 DIAGNOSIS — M4317 Spondylolisthesis, lumbosacral region: Secondary | ICD-10-CM

## 2023-08-15 DIAGNOSIS — M47816 Spondylosis without myelopathy or radiculopathy, lumbar region: Secondary | ICD-10-CM | POA: Diagnosis not present

## 2023-08-15 DIAGNOSIS — M48062 Spinal stenosis, lumbar region with neurogenic claudication: Secondary | ICD-10-CM

## 2023-08-15 DIAGNOSIS — M51362 Other intervertebral disc degeneration, lumbar region with discogenic back pain and lower extremity pain: Secondary | ICD-10-CM

## 2023-08-19 DIAGNOSIS — G8929 Other chronic pain: Secondary | ICD-10-CM | POA: Diagnosis not present

## 2023-08-19 DIAGNOSIS — M5416 Radiculopathy, lumbar region: Secondary | ICD-10-CM | POA: Diagnosis not present

## 2023-08-19 DIAGNOSIS — M5441 Lumbago with sciatica, right side: Secondary | ICD-10-CM | POA: Diagnosis not present

## 2023-08-20 ENCOUNTER — Ambulatory Visit (INDEPENDENT_AMBULATORY_CARE_PROVIDER_SITE_OTHER): Admitting: Internal Medicine

## 2023-08-20 ENCOUNTER — Encounter: Payer: Self-pay | Admitting: Internal Medicine

## 2023-08-20 VITALS — BP 122/78 | HR 57 | Ht 64.5 in | Wt 184.0 lb

## 2023-08-20 DIAGNOSIS — E785 Hyperlipidemia, unspecified: Secondary | ICD-10-CM | POA: Diagnosis not present

## 2023-08-20 DIAGNOSIS — E118 Type 2 diabetes mellitus with unspecified complications: Secondary | ICD-10-CM | POA: Diagnosis not present

## 2023-08-20 DIAGNOSIS — I10 Essential (primary) hypertension: Secondary | ICD-10-CM

## 2023-08-20 DIAGNOSIS — E1169 Type 2 diabetes mellitus with other specified complication: Secondary | ICD-10-CM | POA: Diagnosis not present

## 2023-08-20 DIAGNOSIS — E1142 Type 2 diabetes mellitus with diabetic polyneuropathy: Secondary | ICD-10-CM | POA: Diagnosis not present

## 2023-08-20 DIAGNOSIS — Z0000 Encounter for general adult medical examination without abnormal findings: Secondary | ICD-10-CM

## 2023-08-20 DIAGNOSIS — Z7985 Long-term (current) use of injectable non-insulin antidiabetic drugs: Secondary | ICD-10-CM

## 2023-08-20 DIAGNOSIS — F331 Major depressive disorder, recurrent, moderate: Secondary | ICD-10-CM

## 2023-08-20 DIAGNOSIS — F5101 Primary insomnia: Secondary | ICD-10-CM

## 2023-08-20 DIAGNOSIS — E559 Vitamin D deficiency, unspecified: Secondary | ICD-10-CM

## 2023-08-20 DIAGNOSIS — M5416 Radiculopathy, lumbar region: Secondary | ICD-10-CM | POA: Insufficient documentation

## 2023-08-20 MED ORDER — DULOXETINE HCL 60 MG PO CPEP: 120.0000 mg | ORAL_CAPSULE | Freq: Every day | ORAL | 1 refills | Status: AC

## 2023-08-20 MED ORDER — ZOLPIDEM TARTRATE 10 MG PO TABS: 10.0000 mg | ORAL_TABLET | Freq: Every evening | ORAL | 5 refills | Status: DC | PRN

## 2023-08-20 MED ORDER — PRAVASTATIN SODIUM 10 MG PO TABS: 10.0000 mg | ORAL_TABLET | ORAL | 1 refills | Status: DC

## 2023-08-20 NOTE — Progress Notes (Signed)
 Date:  08/20/2023   Name:  [name]   DOB:  [birth year]   MRN:  [id number]   Chief Complaint: Annual Exam [name] is a 68 y.o. female who presents today for her Complete Annual Exam. She feels well. She reports exercising - some. She reports she is sleeping poorly. Breast complaints - none.  Health Maintenance  Topic Date Due   Eye exam for diabetics  09/13/2022   COVID-19 Vaccine (3 - 2024-25 season) 09/29/2022   Medicare Annual Wellness Visit  05/30/2023   Yearly kidney function blood test for diabetes  06/06/2023   Yearly kidney health urinalysis for diabetes  06/06/2023   Flu Shot  08/29/2023   Hemoglobin A1C  11/20/2023   DEXA scan (bone density measurement)  04/25/2024   Mammogram  05/14/2024   Complete foot exam   05/20/2024   Colon Cancer Screening  06/19/2025   DTaP/Tdap/Td vaccine (2 - Td or Tdap) 07/03/2026   Pneumococcal Vaccine for age over 29  Completed   Zoster (Shingles) Vaccine  Completed   Hepatitis C Screening  Addressed   Hepatitis B Vaccine  Aged Out   HPV Vaccine  Aged Out   Meningitis B Vaccine  Aged Out    Hypertension Pertinent negatives include no chest pain, headaches, palpitations or shortness of breath.  Diabetes Hypoglycemia symptoms include nervousness/anxiousness. Pertinent negatives for hypoglycemia include no dizziness or headaches. Pertinent negatives for diabetes include no chest pain, no fatigue and no weakness.  Hyperlipidemia Associated symptoms include myalgias (bilateral foot pain attributed to DM neuropathy). Pertinent negatives include no chest pain or shortness of breath.  Depression        Associated symptoms include myalgias (bilateral foot pain attributed to DM neuropathy).  Associated symptoms include no fatigue and no headaches. Back Pain This is a chronic problem. The problem occurs constantly. The problem has been gradually worsening since onset. The pain is present in the lumbar spine. Pertinent negatives  include no abdominal pain, chest pain, headaches or weakness. Treatments tried: planning ESI tomorrow.    Review of Systems  Constitutional:  Negative for fatigue and unexpected weight change.  HENT:  Negative for trouble swallowing.   Eyes:  Negative for visual disturbance.  Respiratory:  Negative for cough, chest tightness, shortness of breath and wheezing.   Cardiovascular:  Negative for chest pain, palpitations and leg swelling.  Gastrointestinal:  Negative for abdominal pain, constipation and diarrhea.  Genitourinary:  Negative for frequency and urgency.  Musculoskeletal:  Positive for back pain and myalgias (bilateral foot pain attributed to DM neuropathy). Negative for arthralgias.  Skin:  Negative for color change and rash.  Neurological:  Negative for dizziness, weakness, light-headedness and headaches.  Hematological:  Negative for adenopathy.  Psychiatric/Behavioral:  Positive for depression and dysphoric mood. Negative for sleep disturbance. The patient is nervous/anxious.      Lab Results  Component Value Date   NA 141 06/06/2022   K 4.5 06/06/2022   CO2 22 06/06/2022   GLUCOSE 119 (H) 06/06/2022   BUN 18 06/06/2022   CREATININE 0.67 06/06/2022   CALCIUM 10.0 06/06/2022   EGFR 96 06/06/2022   GFRNONAA >60 10/11/2020   Lab Results  Component Value Date   CHOL 203 (H) 06/06/2022   HDL 50 06/06/2022   LDLCALC 124 (H) 06/06/2022   LDLDIRECT 161.1 11/08/2010   TRIG 165 (H) 06/06/2022   CHOLHDL 4.1 06/06/2022   Lab Results  Component Value Date   TSH 1.460 06/06/2022  Lab Results  Component Value Date   HGBA1C 6.3 (A) 05/21/2023   Lab Results  Component Value Date   WBC 7.9 06/06/2022   HGB 12.5 06/06/2022   HCT 41.3 06/06/2022   MCV 82 06/06/2022   PLT 325 06/06/2022   Lab Results  Component Value Date   ALT 40 (H) 06/06/2022   AST 37 06/06/2022   ALKPHOS 135 (H) 06/06/2022   BILITOT 0.4 06/06/2022   Lab Results  Component Value Date   VD25OH  30.2 06/06/2022     Patient Active Problem List   Diagnosis Date Noted   Lumbar radiculitis 08/20/2023   Diabetic peripheral neuropathy (HCC) 05/21/2023   Osteoarthritis of left midfoot 03/05/2023   Peroneal tendinitis, left 03/05/2023   Adenomatous polyp of colon 06/20/2022   Screening for colon cancer 06/20/2022   Anemia 11/08/2021   Rash of body 09/28/2020   S/P hysterectomy 02/09/2020   Moderate episode of recurrent major depressive disorder (HCC) 02/09/2020   Neuropathy 07/30/2019   Osteopenia determined by x-ray 04/26/2019   Hyperlipidemia associated with type 2 diabetes mellitus (HCC) 07/15/2017   Type II diabetes mellitus with complication (HCC) 04/18/2017   Vitamin D  deficiency 04/18/2017   Obesity 02/28/2015   Hemorrhoids 03/04/2012   Insomnia 03/04/2012   Arthralgia 03/04/2012   Migraine without aura or status migrainosus 03/04/2012   Arthralgia of right temporomandibular joint 03/04/2012   Essential hypertension 11/08/2010    Allergies  Allergen Reactions   Amlodipine  Swelling    And headache    Past Surgical History:  Procedure Laterality Date   ABDOMINAL HYSTERECTOMY  2008   for Menorrhagia   BREAST REDUCTION SURGERY     COLONOSCOPY     COLONOSCOPY WITH PROPOFOL  N/A 06/20/2022   Procedure: COLONOSCOPY WITH PROPOFOL ;  Surgeon: Therisa Bi, MD;  Location: [facility] ENDOSCOPY;  Service: Gastroenterology;  Laterality: N/A;   COSMETIC SURGERY     LAPAROSCOPY     OPEN REDUCTION INTERNAL FIXATION (ORIF) DISTAL RADIAL FRACTURE Right 10/12/2020   Procedure: OPEN REDUCTION INTERNAL FIXATION (ORIF) DISTAL RADIAL FRACTURE;  Surgeon: Kathlynn Sharper, MD;  Location: [facility] ORS;  Service: Orthopedics;  Laterality: Right;   REDUCTION MAMMAPLASTY Bilateral 1991   TUBAL LIGATION      Social History   Tobacco Use   Smoking status: Never   Smokeless tobacco: Never  Vaping Use   Vaping status: Never Used  Substance Use Topics   Alcohol use: Not Currently    Alcohol/week:  2.0 standard drinks of alcohol    Types: 2 Glasses of wine per week   Drug use: Never     Medication list has been reviewed and updated.  Current Meds  Medication Sig   Blood Glucose Monitoring Suppl DEVI Use to test blood sugar twice daily.   Cholecalciferol 25 MCG (1000 UT) TBDP Take 1 tablet by mouth daily.   Continuous Glucose Sensor (FREESTYLE LIBRE 3 PLUS SENSOR) MISC Place 1 sensor on the skin every 15 days. Use to check glucose continuously   dapagliflozin  propanediol (FARXIGA ) 10 MG TABS tablet Take 10 mg by mouth daily.   diclofenac  (VOLTAREN ) 75 MG EC tablet Take 1 tablet (75 mg total) by mouth 2 (two) times daily. X 1 week then twice daily as-needed.   Glucose Blood (BLOOD GLUCOSE TEST STRIPS) STRP Use to test blood sugar twice daily.   glucose blood (ONETOUCH ULTRA) test strip Use to test blood sugar twice a day   Insulin  Pen Needle (PEN NEEDLES) 31G X 5 MM MISC 1  each by Does not apply route once a week.   Lancet Device MISC Use to test blood sugar twice daily.   losartan -hydrochlorothiazide (HYZAAR) 100-12.5 MG tablet Take 1 tablet by mouth daily.   nebivolol  (BYSTOLIC ) 5 MG tablet Take 1 tablet (5 mg total) by mouth daily.   pregabalin  (LYRICA ) 50 MG capsule Take 1 capsule (50 mg total) by mouth at bedtime.   tirzepatide  (MOUNJARO ) 2.5 MG/0.5ML Pen Inject 2.5 mg into the skin once a week.   vitamin B-12 (CYANOCOBALAMIN ) 1000 MCG tablet Take 1,000 mcg by mouth daily.   [DISCONTINUED] dapagliflozin  propanediol (FARXIGA ) 10 MG TABS tablet Take 1 tablet (10 mg total) by mouth daily before breakfast.   [DISCONTINUED] DULoxetine  (CYMBALTA ) 60 MG capsule Take 2 capsules (120 mg total) by mouth daily.   [DISCONTINUED] pravastatin  (PRAVACHOL ) 10 MG tablet Take 1 tablet (10 mg total) by mouth 2 (two) times a week.   [DISCONTINUED] tirzepatide  (MOUNJARO ) 5 MG/0.5ML Pen Inject 5 mg into the skin once a week. (Patient taking differently: Inject 2.5 mg into the skin once a week.)    [DISCONTINUED] zolpidem  (AMBIEN ) 10 MG tablet Take 1 tablet (10 mg total) by mouth at bedtime as needed for sleep.       08/20/2023   10:37 AM 05/21/2023    3:02 PM 02/12/2023    1:34 PM 10/11/2022    3:18 PM  GAD 7 : Generalized Anxiety Score  Nervous, Anxious, on Edge 1 1 1 1   Control/stop worrying 0 0 1 1  Worry too much - different things 0 1 1 1   Trouble relaxing 2 1 0 1  Restless 0 0 0 1  Easily annoyed or irritable 2 1 2 2   Afraid - awful might happen 0 0 0 0  Total GAD 7 Score 5 4 5 7   Anxiety Difficulty Not difficult at all Not difficult at all Somewhat difficult Not difficult at all       08/20/2023   10:36 AM 05/21/2023    3:01 PM 02/12/2023    1:33 PM  Depression screen PHQ 2/9  Decreased Interest 1 1 3   Down, Depressed, Hopeless 1 1 1   PHQ - 2 Score 2 2 4   Altered sleeping 3 3 3   Tired, decreased energy 3 3 3   Change in appetite 0 1 3  Feeling bad or failure about yourself  0 1 1  Trouble concentrating 1 2 3   Moving slowly or fidgety/restless 0 0 0  Suicidal thoughts 0 0 0  PHQ-9 Score 9 12 17   Difficult doing work/chores Somewhat difficult  Very difficult    BP Readings from Last 3 Encounters:  08/20/23 122/78  05/21/23 110/68  05/08/23 132/82    Physical Exam Vitals and nursing note reviewed.  Constitutional:      General: She is not in acute distress.    Appearance: She is well-developed.  HENT:     Head: Normocephalic and atraumatic.     Right Ear: Tympanic membrane and ear canal normal.     Left Ear: Tympanic membrane and ear canal normal.     Nose:     Right Sinus: No maxillary sinus tenderness.     Left Sinus: No maxillary sinus tenderness.  Eyes:     General: No scleral icterus.       Right eye: No discharge.        Left eye: No discharge.     Conjunctiva/sclera: Conjunctivae normal.  Neck:     Thyroid : No thyromegaly.  Vascular: No carotid bruit.  Cardiovascular:     Rate and Rhythm: Normal rate and regular rhythm.     Pulses:  Normal pulses.     Heart sounds: Normal heart sounds.  Pulmonary:     Effort: Pulmonary effort is normal. No respiratory distress.     Breath sounds: No wheezing.  Abdominal:     General: Bowel sounds are normal.     Palpations: Abdomen is soft.     Tenderness: There is no abdominal tenderness.  Musculoskeletal:     Cervical back: Normal range of motion. No erythema.     Right lower leg: No edema.     Left lower leg: No edema.  Lymphadenopathy:     Cervical: No cervical adenopathy.  Skin:    General: Skin is warm and dry.     Capillary Refill: Capillary refill takes less than 2 seconds.     Findings: No rash.  Neurological:     Mental Status: She is alert and oriented to person, place, and time.     Cranial Nerves: No cranial nerve deficit.     Sensory: No sensory deficit.     Deep Tendon Reflexes: Reflexes are normal and symmetric.  Psychiatric:        Attention and Perception: Attention normal.        Mood and Affect: Mood normal.        Behavior: Behavior normal.     Wt Readings from Last 3 Encounters:  08/20/23 184 lb (83.5 kg)  05/21/23 187 lb (84.8 kg)  05/08/23 186 lb (84.4 kg)    BP 122/78   Pulse (!) 57   Ht 5' 4.5 (1.638 m)   Wt 184 lb (83.5 kg)   SpO2 97%   BMI 31.10 kg/m   Assessment and Plan:  Problem List Items Addressed This Visit       Unprioritized   Essential hypertension (Chronic)   Blood pressure is well controlled.  Current medications are losartan  and hydrochlorothiazide. Will continue same regimen along with efforts to limit dietary sodium.       Relevant Medications   pravastatin  (PRAVACHOL ) 10 MG tablet (Start on 08/21/2023)   Other Relevant Orders   CBC with Differential/Platelet   Comprehensive metabolic panel with GFR   Type II diabetes mellitus with complication (HCC) (Chronic)   Blood sugars have been stable.  No recent hypoglycemic events requiring assistance. Currently medications are Farxiga  and Mounjaro . Lab Results   Component Value Date   HGBA1C 6.3 (A) 05/21/2023   Last visit no changes were made. Will continue Mounjaro  2.5 mg which is tolerated well. May increase dose next visit.       Relevant Medications   pravastatin  (PRAVACHOL ) 10 MG tablet (Start on 08/21/2023)   dapagliflozin  propanediol (FARXIGA ) 10 MG TABS tablet   tirzepatide  (MOUNJARO ) 2.5 MG/0.5ML Pen   Other Relevant Orders   Comprehensive metabolic panel with GFR   Hemoglobin A1c   Microalbumin / creatinine urine ratio   Hyperlipidemia associated with type 2 diabetes mellitus (HCC) (Chronic)   Taking pravachol  twice a week without side effects.  She does not want to increase to daily as she is generally resistant to statin therapy. Lab Results  Component Value Date   LDLCALC 124 (H) 06/06/2022  Goal is < 70.      Relevant Medications   pravastatin  (PRAVACHOL ) 10 MG tablet (Start on 08/21/2023)   dapagliflozin  propanediol (FARXIGA ) 10 MG TABS tablet   tirzepatide  (MOUNJARO ) 2.5 MG/0.5ML Pen  Other Relevant Orders   Lipid panel   Moderate episode of recurrent major depressive disorder (HCC) (Chronic)   Clinically stable on Cymbalta .   No SI or HI on evaluation. Plan to continue same medications for now.       Relevant Medications   DULoxetine  (CYMBALTA ) 60 MG capsule   Other Relevant Orders   TSH   Diabetic peripheral neuropathy (HCC) (Chronic)   On Lyrica  with some improvement in foot pain. Now seeing pain management for ESI - lumbar changes may be contributing to LE symptoms       Relevant Medications   pravastatin  (PRAVACHOL ) 10 MG tablet (Start on 08/21/2023)   zolpidem  (AMBIEN ) 10 MG tablet   DULoxetine  (CYMBALTA ) 60 MG capsule   dapagliflozin  propanediol (FARXIGA ) 10 MG TABS tablet   tirzepatide  (MOUNJARO ) 2.5 MG/0.5ML Pen   Lumbar radiculitis (Chronic)   ESI planned for tomorrow.      Relevant Medications   zolpidem  (AMBIEN ) 10 MG tablet   DULoxetine  (CYMBALTA ) 60 MG capsule   Insomnia   Relevant  Medications   zolpidem  (AMBIEN ) 10 MG tablet   Vitamin D  deficiency   Relevant Orders   VITAMIN D  25 Hydroxy (Vit-D Deficiency, Fractures)   Other Visit Diagnoses       Annual physical exam    -  Primary   up to date on screening and immunizations     Long-term current use of injectable noninsulin antidiabetic medication           Return in about 4 months (around 12/21/2023) for DM, HTN.    Leita HILARIO Adie, MD [facility] [place]

## 2023-08-20 NOTE — Assessment & Plan Note (Signed)
 On Lyrica  with some improvement in foot pain. Now seeing pain management for ESI - lumbar changes may be contributing to LE symptoms

## 2023-08-20 NOTE — Assessment & Plan Note (Addendum)
 Taking pravachol  twice a week without side effects.  She does not want to increase to daily as she is generally resistant to statin therapy. Lab Results  Component Value Date   LDLCALC 124 (H) 06/06/2022  Goal is < 70.

## 2023-08-20 NOTE — Assessment & Plan Note (Signed)
 ESI planned for tomorrow.

## 2023-08-20 NOTE — Assessment & Plan Note (Signed)
 Blood pressure is well controlled.  Current medications are losartan and hydrochlorothiazide. Will continue same regimen along with efforts to limit dietary sodium.

## 2023-08-20 NOTE — Assessment & Plan Note (Addendum)
 Blood sugars have been stable.  No recent hypoglycemic events requiring assistance. Currently medications are Farxiga  and Mounjaro . Lab Results  Component Value Date   HGBA1C 6.3 (A) 05/21/2023   Last visit no changes were made. Will continue Mounjaro  2.5 mg which is tolerated well. May increase dose next visit.

## 2023-08-20 NOTE — Assessment & Plan Note (Signed)
 Clinically stable on Cymbalta.   No SI or HI on evaluation. Plan to continue same medications for now.

## 2023-08-21 DIAGNOSIS — E119 Type 2 diabetes mellitus without complications: Secondary | ICD-10-CM | POA: Diagnosis not present

## 2023-08-21 DIAGNOSIS — M5416 Radiculopathy, lumbar region: Secondary | ICD-10-CM | POA: Diagnosis not present

## 2023-08-21 LAB — CBC WITH DIFFERENTIAL/PLATELET
Basophils Absolute: 0.1 x10E3/uL (ref 0.0–0.2)
Basos: 1 %
EOS (ABSOLUTE): 0.1 x10E3/uL (ref 0.0–0.4)
Eos: 1 %
Hematocrit: 41 % (ref 34.0–46.6)
Hemoglobin: 12.3 g/dL (ref 11.1–15.9)
Immature Grans (Abs): 0 x10E3/uL (ref 0.0–0.1)
Immature Granulocytes: 0 %
Lymphocytes Absolute: 1.8 x10E3/uL (ref 0.7–3.1)
Lymphs: 23 %
MCH: 24.6 pg — ABNORMAL LOW (ref 26.6–33.0)
MCHC: 30 g/dL — ABNORMAL LOW (ref 31.5–35.7)
MCV: 82 fL (ref 79–97)
Monocytes Absolute: 0.6 x10E3/uL (ref 0.1–0.9)
Monocytes: 8 %
Neutrophils Absolute: 5.3 x10E3/uL (ref 1.4–7.0)
Neutrophils: 67 %
Platelets: 347 x10E3/uL (ref 150–450)
RBC: 4.99 x10E6/uL (ref 3.77–5.28)
RDW: 17.4 % — ABNORMAL HIGH (ref 11.7–15.4)
WBC: 7.8 x10E3/uL (ref 3.4–10.8)

## 2023-08-21 LAB — LIPID PANEL
Chol/HDL Ratio: 4.3 ratio (ref 0.0–4.4)
Cholesterol, Total: 195 mg/dL (ref 100–199)
HDL: 45 mg/dL (ref >39)
LDL Chol Calc (NIH): 124 mg/dL — ABNORMAL HIGH (ref 0–99)
Triglycerides: 144 mg/dL (ref 0–149)
VLDL Cholesterol Cal: 26 mg/dL (ref 5–40)

## 2023-08-21 LAB — MICROALBUMIN / CREATININE URINE RATIO
Creatinine, Urine: 71.1 mg/dL
Microalb/Creat Ratio: 5 mg/g{creat} (ref 0–29)
Microalbumin, Urine: 3.8 ug/mL

## 2023-08-21 LAB — COMPREHENSIVE METABOLIC PANEL WITH GFR
ALT: 24 IU/L (ref 0–32)
AST: 24 IU/L (ref 0–40)
Albumin: 4.2 g/dL (ref 3.9–4.9)
Alkaline Phosphatase: 146 IU/L — ABNORMAL HIGH (ref 44–121)
BUN/Creatinine Ratio: 22 (ref 12–28)
BUN: 13 mg/dL (ref 8–27)
Bilirubin Total: 0.2 mg/dL (ref 0.0–1.2)
CO2: 22 mmol/L (ref 20–29)
Calcium: 9.5 mg/dL (ref 8.7–10.3)
Chloride: 105 mmol/L (ref 96–106)
Creatinine, Ser: 0.6 mg/dL (ref 0.57–1.00)
Globulin, Total: 2.7 g/dL (ref 1.5–4.5)
Glucose: 101 mg/dL — ABNORMAL HIGH (ref 70–99)
Potassium: 4.8 mmol/L (ref 3.5–5.2)
Sodium: 142 mmol/L (ref 134–144)
Total Protein: 6.9 g/dL (ref 6.0–8.5)
eGFR: 98 mL/min/1.73 (ref >59)

## 2023-08-21 LAB — TSH: TSH: 1.58 u[IU]/mL (ref 0.450–4.500)

## 2023-08-21 LAB — VITAMIN D 25 HYDROXY (VIT D DEFICIENCY, FRACTURES): Vit D, 25-Hydroxy: 35.8 ng/mL (ref 30.0–100.0)

## 2023-08-21 LAB — HEMOGLOBIN A1C
Est. average glucose Bld gHb Est-mCnc: 140 mg/dL
Hgb A1c MFr Bld: 6.5 % — ABNORMAL HIGH (ref 4.8–5.6)

## 2023-08-22 ENCOUNTER — Ambulatory Visit: Payer: Self-pay | Admitting: Internal Medicine

## 2023-09-05 DIAGNOSIS — G4733 Obstructive sleep apnea (adult) (pediatric): Secondary | ICD-10-CM | POA: Diagnosis not present

## 2023-09-09 DIAGNOSIS — G8929 Other chronic pain: Secondary | ICD-10-CM | POA: Diagnosis not present

## 2023-09-09 DIAGNOSIS — M5416 Radiculopathy, lumbar region: Secondary | ICD-10-CM | POA: Diagnosis not present

## 2023-09-09 DIAGNOSIS — M5441 Lumbago with sciatica, right side: Secondary | ICD-10-CM | POA: Diagnosis not present

## 2023-09-10 ENCOUNTER — Ambulatory Visit

## 2023-09-10 VITALS — BP 122/78 | Ht 64.5 in | Wt 182.0 lb

## 2023-09-10 DIAGNOSIS — Z2821 Immunization not carried out because of patient refusal: Secondary | ICD-10-CM

## 2023-09-10 DIAGNOSIS — Z0000 Encounter for general adult medical examination without abnormal findings: Secondary | ICD-10-CM | POA: Diagnosis not present

## 2023-09-10 NOTE — Progress Notes (Signed)
 Because this visit was a virtual/telehealth visit,  certain criteria was not obtained, such a blood pressure, CBG if applicable, and timed get up and go. Any medications not marked as taking were not mentioned during the medication reconciliation part of the visit. Any vitals not documented were not able to be obtained due to this being a telehealth visit or patient was unable to self-report a recent blood pressure reading due to a lack of equipment at home via telehealth. Vitals that have been documented are verbally provided by the patient.  This visit was performed by a medical professional under my direct supervision. I was immediately available for consultation/collaboration. I have reviewed and agree with the Annual Wellness Visit documentation.  Subjective:   [name] is a 68 y.o. who presents for a Medicare Wellness preventive visit.  As a reminder, Annual Wellness Visits don't include a physical exam, and some assessments may be limited, especially if this visit is performed virtually. We may recommend an in-person follow-up visit with your provider if needed.  Visit Complete: Virtual I connected with  [name] on 09/10/23 by a audio enabled telemedicine application and verified that I am speaking with the correct person using two identifiers.  Patient Location: Home  Provider Location: Home Office  I discussed the limitations of evaluation and management by telemedicine. The patient expressed understanding and agreed to proceed.  Vital Signs: Because this visit was a virtual/telehealth visit, some criteria may be missing or patient reported. Any vitals not documented were not able to be obtained and vitals that have been documented are patient reported.  VideoDeclined- This patient declined Librarian, academic. Therefore the visit was completed with audio only.  Persons Participating in Visit: Patient.  AWV Questionnaire: Yes: Patient  Medicare AWV questionnaire was completed by the patient on 09/10/2023; I have confirmed that all information answered by patient is correct and no changes since this date.  Cardiac Risk Factors include: advanced age (>1men, >64 women);hypertension;obesity (BMI >30kg/m2);diabetes mellitus     Objective:    Today's Vitals   09/10/23 1025 09/10/23 1535  BP:  122/78  Weight:  182 lb (82.6 kg)  Height:  5' 4.5 (1.638 m)  PainSc: 7     Body mass index is 30.76 kg/m.     09/10/2023    3:34 PM 06/20/2022    7:14 AM 05/30/2022    9:44 AM 10/12/2020    8:08 AM 10/11/2020   10:35 AM 10/07/2020    9:25 PM 02/22/2020   10:58 AM  Advanced Directives  Does Patient Have a Medical Advance Directive? Yes Yes No No No No Yes  Type of Estate agent of [place];Living will      Healthcare Power of [place];Living will  Does patient want to make changes to medical advance directive? No - Patient declined      No - Patient declined  Copy of Healthcare Power of Attorney in Chart? No - copy requested        Would patient like information on creating a medical advance directive?   No - Patient declined No - Patient declined  Yes (ED - Information included in AVS)     Current Medications (verified) Outpatient Encounter Medications as of 09/10/2023  Medication Sig   Blood Glucose Monitoring Suppl DEVI Use to test blood sugar twice daily.   Cholecalciferol 25 MCG (1000 UT) TBDP Take 1 tablet by mouth daily.   Continuous Glucose Sensor (FREESTYLE LIBRE 3 PLUS SENSOR)  MISC Place 1 sensor on the skin every 15 days. Use to check glucose continuously   dapagliflozin  propanediol (FARXIGA ) 10 MG TABS tablet Take 10 mg by mouth daily.   diclofenac  (VOLTAREN ) 75 MG EC tablet Take 1 tablet (75 mg total) by mouth 2 (two) times daily. X 1 week then twice daily as-needed.   DULoxetine  (CYMBALTA ) 60 MG capsule Take 2 capsules (120 mg total) by mouth daily.   Glucose Blood (BLOOD GLUCOSE TEST STRIPS)  STRP Use to test blood sugar twice daily.   glucose blood (ONETOUCH ULTRA) test strip Use to test blood sugar twice a day   Insulin  Pen Needle (PEN NEEDLES) 31G X 5 MM MISC 1 each by Does not apply route once a week.   Lancet Device MISC Use to test blood sugar twice daily.   losartan -hydrochlorothiazide (HYZAAR) 100-12.5 MG tablet Take 1 tablet by mouth daily.   nebivolol  (BYSTOLIC ) 5 MG tablet Take 1 tablet (5 mg total) by mouth daily.   pravastatin  (PRAVACHOL ) 10 MG tablet Take 1 tablet (10 mg total) by mouth 2 (two) times a week.   pregabalin  (LYRICA ) 50 MG capsule Take 1 capsule (50 mg total) by mouth at bedtime.   tirzepatide  (MOUNJARO ) 2.5 MG/0.5ML Pen Inject 2.5 mg into the skin once a week.   vitamin B-12 (CYANOCOBALAMIN ) 1000 MCG tablet Take 1,000 mcg by mouth daily.   zolpidem  (AMBIEN ) 10 MG tablet Take 1 tablet (10 mg total) by mouth at bedtime as needed for sleep.   HYDROcodone -acetaminophen  (NORCO/VICODIN) 5-325 MG tablet Take 1 tablet by mouth every 6 (six) hours as needed. (Patient not taking: Reported on 08/20/2023)   No facility-administered encounter medications on file as of 09/10/2023.    Allergies (verified) Amlodipine    History: Past Medical History:  Diagnosis Date   Anemia    Anxiety    Arthritis    Complication of anesthesia    Depression    Diabetes mellitus without complication (HCC)    GERD (gastroesophageal reflux disease)    Headache    Hypertension    PONV (postoperative nausea and vomiting)    Sleep apnea    Past Surgical History:  Procedure Laterality Date   ABDOMINAL HYSTERECTOMY  2008   for Menorrhagia   BREAST REDUCTION SURGERY     COLONOSCOPY     COLONOSCOPY WITH PROPOFOL  N/A 06/20/2022   Procedure: COLONOSCOPY WITH PROPOFOL ;  Surgeon: Therisa Bi, MD;  Location: [facility] ENDOSCOPY;  Service: Gastroenterology;  Laterality: N/A;   COSMETIC SURGERY     LAPAROSCOPY     OPEN REDUCTION INTERNAL FIXATION (ORIF) DISTAL RADIAL FRACTURE Right  10/12/2020   Procedure: OPEN REDUCTION INTERNAL FIXATION (ORIF) DISTAL RADIAL FRACTURE;  Surgeon: Kathlynn Sharper, MD;  Location: [facility] ORS;  Service: Orthopedics;  Laterality: Right;   REDUCTION MAMMAPLASTY Bilateral 1991   TUBAL LIGATION     Family History  Problem Relation Age of Onset   Diabetes Daughter    Hypertension Daughter    Breast cancer Maternal Aunt    Diabetes Maternal Uncle    Social History   Socioeconomic History   Marital status: Widowed    Spouse name: Not on file   Number of children: 1   Years of education: Not on file   Highest education level: Some college, no degree  Occupational History   Not on file  Tobacco Use   Smoking status: Never   Smokeless tobacco: Never  Vaping Use   Vaping status: Never Used  Substance and Sexual Activity  Alcohol use: Not Currently    Alcohol/week: 2.0 standard drinks of alcohol    Types: 2 Glasses of wine per week   Drug use: Never   Sexual activity: Not Currently    Partners: Male    Birth control/protection: Surgical  Other Topics Concern   Not on file  Social History Narrative   ** Merged History Encounter **       Social Drivers of Health   Financial Resource Strain: Low Risk  (09/10/2023)   Overall Financial Resource Strain (CARDIA)    Difficulty of Paying Living Expenses: Not hard at all  Food Insecurity: No Food Insecurity (09/10/2023)   Hunger Vital Sign    Worried About Running Out of Food in the Last Year: Never true    Ran Out of Food in the Last Year: Never true  Transportation Needs: No Transportation Needs (09/10/2023)   PRAPARE - Administrator, Civil Service (Medical): No    Lack of Transportation (Non-Medical): No  Physical Activity: Insufficiently Active (09/10/2023)   Exercise Vital Sign    Days of Exercise per Week: 5 days    Minutes of Exercise per Session: 20 min  Stress: No Stress Concern Present (09/10/2023)   [employer] of Occupational Health - Occupational Stress  Questionnaire    Feeling of Stress: Only a little  Social Connections: Socially Isolated (09/10/2023)   Social Connection and Isolation Panel    Frequency of Communication with Friends and Family: More than three times a week    Frequency of Social Gatherings with Friends and Family: Three times a week    Attends Religious Services: Never    Active Member of Clubs or Organizations: No    Attends Banker Meetings: Never    Marital Status: Widowed    Tobacco Counseling Counseling given: Not Answered    Clinical Intake:  Pre-visit preparation completed: No  Pain : 0-10 Pain Score: 7  Pain Type: Chronic pain Pain Location: Back Pain Orientation: Lower Pain Descriptors / Indicators: Aching Pain Onset: Today Pain Relieving Factors: patient is being treated with injections  Pain Relieving Factors: patient is being treated with injections  BMI - recorded: 30.76 Nutritional Status: BMI > 30  Obese Nutritional Risks: None Diabetes: Yes CBG done?: No Did pt. bring in CBG monitor from home?: No  Lab Results  Component Value Date   HGBA1C 6.5 (H) 08/20/2023   HGBA1C 6.3 (A) 05/21/2023   HGBA1C 7.2 (A) 02/12/2023     How often do you need to have someone help you when you read instructions, pamphlets, or other written materials from your doctor or pharmacy?: 1 - Never  Interpreter Needed?: No  Information entered by :: Cuinn Westerhold,CMA   Activities of Daily Living     09/10/2023   10:25 AM  In your present state of health, do you have any difficulty performing the following activities:  Hearing? 0  Vision? 0  Difficulty concentrating or making decisions? 0  Walking or climbing stairs? 0  Dressing or bathing? 0  Doing errands, shopping? 0  Preparing Food and eating ? N  Using the Toilet? N  In the past six months, have you accidently leaked urine? Y  Do you have problems with loss of bowel control? N  Managing your Medications? N  Managing your  Finances? N  Housekeeping or managing your Housekeeping? N    Patient Care Team: Justus Leita DEL, MD as PCP - General (Internal Medicine) Verdia Art, MD as Consulting  Physician (Pulmonary Disease) Troxler, Donnice, DPM (Inactive) as Attending Physician (Podiatry) Patty[facility]Ision as Attending Physician (Ophthalmology) Alana, Sharyle LABOR, RPH-CPP as Pharmacist  I have updated your Care Teams any recent Medical Services you may have received from other providers in the past year.     Assessment:   This is a routine wellness examination for Bethany.  Hearing/Vision screen Hearing Screening - Comments:: No difficulties  Vision Screening - Comments:: Patient wears glasses    Goals Addressed             This Visit's Progress    Patient Stated       To get diabetes under control        Depression Screen     09/10/2023    4:04 PM 08/20/2023   10:36 AM 05/21/2023    3:01 PM 02/12/2023    1:33 PM 10/11/2022    3:18 PM 06/06/2022   10:43 AM 05/30/2022    9:42 AM  PHQ 2/9 Scores  PHQ - 2 Score 0 2 2 4 2 6 2   PHQ- 9 Score 0 9 12 17 13 24 4     Fall Risk     09/10/2023   10:25 AM 08/20/2023   10:36 AM 05/21/2023    3:01 PM 02/12/2023    1:33 PM 10/11/2022    3:18 PM  Fall Risk   Falls in the past year? 0 0 0 1 0  Number falls in past yr: 0 0 0 0 0  Injury with Fall? 0 0 0 1 0  Risk for fall due to : History of fall(s) No Fall Risks No Fall Risks History of fall(s)   Follow up Education provided Falls evaluation completed Falls evaluation completed Falls evaluation completed     MEDICARE RISK AT HOME:  Medicare Risk at Home Any stairs in or around the home?: (Patient-Rptd) Yes If so, are there any without handrails?: (Patient-Rptd) No Home free of loose throw rugs in walkways, pet beds, electrical cords, etc?: (Patient-Rptd) Yes Adequate lighting in your home to reduce risk of falls?: (Patient-Rptd) Yes Life alert?: (Patient-Rptd) No Use of a cane, walker or w/c?:  (Patient-Rptd) No Grab bars in the bathroom?: (Patient-Rptd) No Shower chair or bench in shower?: (Patient-Rptd) No Elevated toilet seat or a handicapped toilet?: (Patient-Rptd) Yes  TIMED UP AND GO:  Was the test performed?  No  Cognitive Function: 6CIT completed        09/10/2023    3:40 PM 05/30/2022    9:51 AM  6CIT Screen  What Year? 0 points 0 points  What month? 0 points 0 points  What time? 0 points 0 points  Count back from 20 0 points 0 points  Months in reverse 0 points 0 points  Repeat phrase 0 points 2 points  Total Score 0 points 2 points    Immunizations Immunization History  Administered Date(s) Administered   Fluad Quad(high Dose 65+) 10/26/2021   Fluad Trivalent(High Dose 65+) 10/11/2022   Influenza Split 09/21/2014   Influenza,inj,Quad PF,6+ Mos 11/02/2016, 10/28/2017, 11/03/2018, 10/25/2020   Influenza-Unspecified 11/04/2012, 11/04/2016, 11/03/2018, 10/12/2019, 10/25/2020   Moderna Sars-Covid-2 Vaccination 10/12/2019, 11/09/2019   PNEUMOCOCCAL CONJUGATE-20 09/15/2020   Tdap 07/02/2016   Zoster Recombinant(Shingrix ) 12/02/2018, 03/08/2019    Screening Tests Health Maintenance  Topic Date Due   COVID-19 Vaccine (3 - 2024-25 season) 09/29/2022   INFLUENZA VACCINE  08/29/2023   HEMOGLOBIN A1C  02/20/2024   DEXA SCAN  04/25/2024   MAMMOGRAM  05/14/2024   FOOT  EXAM  05/20/2024   OPHTHALMOLOGY EXAM  05/21/2024   Diabetic kidney evaluation - eGFR measurement  08/19/2024   Diabetic kidney evaluation - Urine ACR  08/19/2024   Medicare Annual Wellness (AWV)  09/09/2024   Colonoscopy  06/19/2025   DTaP/Tdap/Td (2 - Td or Tdap) 07/03/2026   Pneumococcal Vaccine: 50+ Years  Completed   Zoster Vaccines- Shingrix   Completed   Hepatitis C Screening  Addressed   Hepatitis B Vaccines  Aged Out   HPV VACCINES  Aged Out   Meningococcal B Vaccine  Aged Out    Health Maintenance  Health Maintenance Due  Topic Date Due   COVID-19 Vaccine (3 - 2024-25  season) 09/29/2022   INFLUENZA VACCINE  08/29/2023   Health Maintenance Items Addressed:patient declined vaccination  Additional Screening:  Vision Screening: Recommended annual ophthalmology exams for early detection of glaucoma and other disorders of the eye. Would you like a referral to an eye doctor? No    Dental Screening: Recommended annual dental exams for proper oral hygiene  Community Resource Referral / Chronic Care Management: CRR required this visit?  No   CCM required this visit?  No   Plan:    I have personally reviewed and noted the following in the patient's chart:   Medical and social history Use of alcohol, tobacco or illicit drugs  Current medications and supplements including opioid prescriptions. Patient is not currently taking opioid prescriptions. Functional ability and status Nutritional status Physical activity Advanced directives List of other physicians Hospitalizations, surgeries, and ER visits in previous 12 months Vitals Screenings to include cognitive, depression, and falls Referrals and appointments  In addition, I have reviewed and discussed with patient certain preventive protocols, quality metrics, and best practice recommendations. A written personalized care plan for preventive services as well as general preventive health recommendations were provided to patient.   Lyle MARLA Right, [place]   09/10/2023   After Visit Summary: (MyChart) Due to this being a telephonic visit, the after visit summary with patients personalized plan was offered to patient via MyChart   Notes: Nothing significant to report at this time.

## 2023-09-10 NOTE — Patient Instructions (Signed)
 [name] , Thank you for taking time out of your busy schedule to complete your Annual Wellness Visit with me. I enjoyed our conversation and look forward to speaking with you again next year. I, as well as your care team,  appreciate your ongoing commitment to your health goals. Please review the following plan we discussed and let me know if I can assist you in the future. Your Game plan/ To Do List    Referrals: If you haven't heard from the office you've been referred to, please reach out to them at the phone provided.   Follow up Visits: We will see or speak with you next year for your Next Medicare AWV with our clinical staff Have you seen your provider in the last 6 months (3 months if uncontrolled diabetes)? Yes  Clinician Recommendations:  Aim for 30 minutes of exercise or brisk walking, 6-8 glasses of water , and 5 servings of fruits and vegetables each day.       This is a list of the screenings recommended for you:  Health Maintenance  Topic Date Due   COVID-19 Vaccine (3 - 2024-25 season) 09/29/2022   Flu Shot  08/29/2023   Hemoglobin A1C  02/20/2024   DEXA scan (bone density measurement)  04/25/2024   Mammogram  05/14/2024   Complete foot exam   05/20/2024   Eye exam for diabetics  05/21/2024   Yearly kidney function blood test for diabetes  08/19/2024   Yearly kidney health urinalysis for diabetes  08/19/2024   Medicare Annual Wellness Visit  09/09/2024   Colon Cancer Screening  06/19/2025   DTaP/Tdap/Td vaccine (2 - Td or Tdap) 07/03/2026   Pneumococcal Vaccine for age over 74  Completed   Zoster (Shingles) Vaccine  Completed   Hepatitis C Screening  Addressed   Hepatitis B Vaccine  Aged Out   HPV Vaccine  Aged Out   Meningitis B Vaccine  Aged Out    Advanced directives: (Copy Requested) Please bring a copy of your health care power of attorney and living will to the office to be added to your chart at your convenience. You can mail to [facility] [address] [place], [place] [address] or email to [email or web address] Advance Care Planning is important because it:  [x]  Makes sure you receive the medical care that is consistent with your values, goals, and preferences  [x]  It provides guidance to your family and loved ones and reduces their decisional burden about whether or not they are making the right decisions based on your wishes.  Follow the link provided in your after visit summary or read over the paperwork we have mailed to you to help you started getting your Advance Directives in place. If you need assistance in completing these, please reach out to us  so that we can help you!  See attachments for Preventive Care and Fall Prevention Tips.

## 2023-09-16 ENCOUNTER — Other Ambulatory Visit: Payer: Self-pay

## 2023-09-16 ENCOUNTER — Telehealth: Payer: Self-pay | Admitting: Internal Medicine

## 2023-09-16 MED ORDER — DAPAGLIFLOZIN PROPANEDIOL 10 MG PO TABS: 10.0000 mg | ORAL_TABLET | Freq: Every day | ORAL | 1 refills | Status: DC

## 2023-09-16 NOTE — Telephone Encounter (Signed)
 Copied from CRM [id number]. Topic: Clinical - Medication Question >> Sep 16, 2023  2:41 PM Precious C wrote: Reason for CRM: Patient called to check on the status of her medication for [place]. She stated that the manufacturer sent her a letter and mentioned they also sent one to her provider. Patient is requesting a follow-up regarding this and can be reached at [phone].

## 2023-09-16 NOTE — Telephone Encounter (Signed)
 Patient informed I sent this electronic.  - CMcAdoo

## 2023-09-25 DIAGNOSIS — M5416 Radiculopathy, lumbar region: Secondary | ICD-10-CM | POA: Diagnosis not present

## 2023-09-25 DIAGNOSIS — E119 Type 2 diabetes mellitus without complications: Secondary | ICD-10-CM | POA: Diagnosis not present

## 2023-10-10 DIAGNOSIS — G8929 Other chronic pain: Secondary | ICD-10-CM | POA: Diagnosis not present

## 2023-10-10 DIAGNOSIS — M5441 Lumbago with sciatica, right side: Secondary | ICD-10-CM | POA: Diagnosis not present

## 2023-10-10 DIAGNOSIS — M5416 Radiculopathy, lumbar region: Secondary | ICD-10-CM | POA: Diagnosis not present

## 2023-10-22 ENCOUNTER — Ambulatory Visit: Attending: Physical Medicine & Rehabilitation | Admitting: Physical Therapy

## 2023-10-22 DIAGNOSIS — M5459 Other low back pain: Secondary | ICD-10-CM | POA: Insufficient documentation

## 2023-10-22 DIAGNOSIS — M4316 Spondylolisthesis, lumbar region: Secondary | ICD-10-CM | POA: Insufficient documentation

## 2023-10-22 NOTE — Therapy (Unsigned)
 OUTPATIENT PHYSICAL THERAPY THORACOLUMBAR EVALUATION   Patient Name: [name] MRN: [id number] DOB:1955-11-19, 68 y.o., female Today's Date: 10/22/2023  END OF SESSION:  PT End of Session - 10/22/23 2233     Visit Number 1    Number of Visits 24    Date for Recertification  01/14/24    Authorization Type [id number]    Authorization - Visit Number 1    Authorization - Number of Visits 24    Progress Note Due on Visit 10    PT Start Time 1515    PT Stop Time 1600    PT Time Calculation (min) 45 min    Activity Tolerance Patient tolerated treatment well    Behavior During Therapy WFL for tasks assessed/performed          Past Medical History:  Diagnosis Date   Anemia    Anxiety    Arthritis    Complication of anesthesia    Depression    Diabetes mellitus without complication (HCC)    GERD (gastroesophageal reflux disease)    Headache    Hypertension    PONV (postoperative nausea and vomiting)    Sleep apnea    Past Surgical History:  Procedure Laterality Date   ABDOMINAL HYSTERECTOMY  2008   for Menorrhagia   BREAST REDUCTION SURGERY     COLONOSCOPY     COLONOSCOPY WITH PROPOFOL  N/A 06/20/2022   Procedure: COLONOSCOPY WITH PROPOFOL ;  Surgeon: Therisa Bi, MD;  Location: [facility] ENDOSCOPY;  Service: Gastroenterology;  Laterality: N/A;   COSMETIC SURGERY     LAPAROSCOPY     OPEN REDUCTION INTERNAL FIXATION (ORIF) DISTAL RADIAL FRACTURE Right 10/12/2020   Procedure: OPEN REDUCTION INTERNAL FIXATION (ORIF) DISTAL RADIAL FRACTURE;  Surgeon: Kathlynn Sharper, MD;  Location: [facility] ORS;  Service: Orthopedics;  Laterality: Right;   REDUCTION MAMMAPLASTY Bilateral 1991   TUBAL LIGATION     Patient Active Problem List   Diagnosis Date Noted   Lumbar radiculitis 08/20/2023   Diabetic peripheral neuropathy (HCC) 05/21/2023   Osteoarthritis of left midfoot 03/05/2023   Peroneal tendinitis, left 03/05/2023   Adenomatous polyp of colon 06/20/2022   Screening for colon  cancer 06/20/2022   Anemia 11/08/2021   Rash of body 09/28/2020   S/P hysterectomy 02/09/2020   Moderate episode of recurrent major depressive disorder (HCC) 02/09/2020   Neuropathy 07/30/2019   Osteopenia determined by x-ray 04/26/2019   Hyperlipidemia associated with type 2 diabetes mellitus (HCC) 07/15/2017   Type II diabetes mellitus with complication (HCC) 04/18/2017   Vitamin D  deficiency 04/18/2017   Obesity 02/28/2015   Hemorrhoids 03/04/2012   Insomnia 03/04/2012   Arthralgia 03/04/2012   Migraine without aura or status migrainosus 03/04/2012   Arthralgia of right temporomandibular joint 03/04/2012   Essential hypertension 11/08/2010    PCP: Dr. Leita Adie    REFERRING PROVIDER: Dr. Delon Gins     REFERRING DIAG:  M54.16 (ICD-10-CM) - Radiculopathy, lumbar region  M54.41 (ICD-10-CM) - Lumbago with sciatica, right side  G89.29 (ICD-10-CM) - Other chronic pain    Rationale for Evaluation and Treatment: Rehabilitation  THERAPY DIAG:  Other low back pain  Spondylolisthesis of lumbar region  ONSET DATE: Started experiencing low back since teenager when she got in an accident. She experiences right sided low back pain that radiates down posterior thigh into knee.    SUBJECTIVE:  SUBJECTIVE STATEMENT:  Pt reports starting to experience low back pain since she was a teenager. She recently had steroid joint injections in her back but these did not seem to help that much. She is able to complete activities around her home, but not without discomfort and out of necessity because she lives alone.    PERTINENT HISTORY: 10/10/23 Dr. Dodson note Chronic low back pain with right lumbar radiculitis -s/p right S1 TFESI 08/21/2023 with temporary relief -s/p right S1 TFESI with Celestone  09/25/2023 with mild relief    PAIN:  Are you having pain? Yes: NPRS scale: 8/10  Pain location: Right lumbar paraspinal to hamstring down to posterior knee    Pain description: Sharp   Aggravating factors: Walking or sitting for a long time or lifting something heavy and vibrations from riding lawn mower    Relieving factors: Laying flat on her back at night and not turning     PRECAUTIONS: None  RED FLAGS: None   WEIGHT BEARING RESTRICTIONS: No  FALLS:  Has patient fallen in last 6 months? No  LIVING ENVIRONMENT: Lives with: lives alone Lives in: House/apartment Stairs: Yes: External: 3 steps; on right going up and on left going up Has following equipment at home: None  OCCUPATION: Retired     PLOF: Independent  PATIENT GOALS: She wants to hurt less and not limit her from walking.    NEXT MD VISIT: Nothing schedule     OBJECTIVE:  Note: Objective measures were completed at Evaluation unless otherwise noted.  VITALS  BP 127/65 HR 72 Spo2 99  DIAGNOSTIC FINDINGS:  CLINICAL DATA:  68 year old female with neurogenic claudication. Lower extremity pain. Chronic low back pain. L5-S1 spondylolisthesis.   EXAM: MRI LUMBAR SPINE WITHOUT CONTRAST   TECHNIQUE: Multiplanar, multisequence MR imaging of the lumbar spine was performed. No intravenous contrast was administered.   COMPARISON:  None Available.   FINDINGS: Segmentation: Lumbar segmentation appears to be normal and will be designated as such for this report.   Alignment: Grade 1 anterolisthesis of L5 on S1 measuring 5-6 mm. Mildly exaggerated lower lumbar lordosis associated. Subtle underlying dextroconvex lumbar scoliosis.   Vertebrae: Normal background bone marrow signal. Chronic bilateral L5 pars fractures. Mostly chronic degenerative endplate changes at L5-S1, faint degenerative endplate marrow edema there. Benign vertebral body hemangiomas, largest at T12 (normal variant). Intact visible sacrum and  SI joints. No other acute osseous abnormality.   Conus medullaris and cauda equina: Conus extends to the T12-L1 level. No lower spinal cord or conus signal abnormality. Normal cauda equina nerve roots.   Paraspinal and other soft tissues: Negative.   Disc levels:   T10-T11: Negative disc. Mild facet hypertrophy greater on the right.   T11-T12: Normal for age.   T12-L1:  Normal for age.   L1-L2:  Normal for age.   L2-L3:  Normal for age.   L3-L4: Disc space loss. Mild circumferential disc bulge and endplate spurring. Mild facet and ligament flavum hypertrophy greater on the left. No spinal or lateral recess stenosis. Mild L3 neural foraminal stenosis primarily on the left.   L4-L5: Better preserved disc height. Mild disc bulging. Mild to moderate facet hypertrophy. No stenosis.   L5-S1: Spondylo lysis. Grade 1 spondylolisthesis. Moderate to severe disc space loss. Circumferential disc/pseudo disc. Endplate spurring. Moderate to severe posterior element hypertrophy. No spinal or lateral recess stenosis. Moderate to severe bilateral L5 foraminal stenosis.   IMPRESSION: 1. Chronic L5 spondylolysis with grade 1 spondylolisthesis. Advanced disc and posterior element  degeneration with moderate to severe bilateral L5 neural foraminal stenosis. 2. Mild for age lumbar spine degeneration elsewhere. No lumbar spinal or lateral recess stenosis.     Electronically Signed   By: VEAR Hurst M.D.   On: 08/15/2023 09:45  PATIENT SURVEYS:  Modified Oswestry:  MODIFIED OSWESTRY DISABILITY SCALE  Date: 10/22/23 Score  Pain intensity 1 = The pain is bad, but I can manage without having to take (1) I can stand as long as I want but, it increases my pain. pain medication.  2. Personal care (washing, dressing, etc.) 0 =  I can take care of myself normally without causing increased pain.  3. Lifting 1 = I can lift heavy weights, but it causes increased pain.  4. Walking 2 =  Pain prevents me  from walking more than  mile.  5. Sitting 2 =  Pain prevents me from sitting more than 1 hour.  6. Standing 1 =  I can stand as long as I want but, it increases my pain.  7. Sleeping 0 = Pain does not prevent me from sleeping well.  8. Social Life 0 = My social life is normal and does not increase my pain.  9. Traveling 0 =  I can travel anywhere without increased pain.  10. Employment/ Homemaking 1 = My normal homemaking/job activities increase my pain, but I can still perform all that is required of me  Total 8/50 (16%)   Interpretation of scores: Score Category Description  0-20% Minimal Disability The patient can cope with most living activities. Usually no treatment is indicated apart from advice on lifting, sitting and exercise  21-40% Moderate Disability The patient experiences more pain and difficulty with sitting, lifting and standing. Travel and social life are more difficult and they may be disabled from work. Personal care, sexual activity and sleeping are not grossly affected, and the patient can usually be managed by conservative means  41-60% Severe Disability Pain remains the main problem in this group, but activities of daily living are affected. These patients require a detailed investigation  61-80% Crippled Back pain impinges on all aspects of the patient's life. Positive intervention is required  81-100% Bed-bound  These patients are either bed-bound or exaggerating their symptoms  Bluford FORBES Zoe DELENA Karon DELENA, et al. Surgery versus conservative management of stable thoracolumbar fracture: the PRESTO feasibility RCT. [place]): [employer]; 2021 Nov. [facility] Technology Assessment, No. 25.62.) Appendix 3, Oswestry Disability Index category descriptors. Available from: FindJewelers.cz  Minimally Clinically Important Difference (MCID) = 12.8%  COGNITION: Overall cognitive status: Within functional limits for tasks  assessed     SENSATION: WFL  MUSCLE LENGTH: Hamstrings: Right 90 deg; Left 90 deg Thomas test: 5 deg hip flexion bilateral    POSTURE: No Significant postural limitations  PALPATION: Right PSIS TTP    LUMBAR ROM:   AROM eval  Flexion 100%  Extension 100%  Right lateral flexion 100%*  Left lateral flexion 100%*  Right rotation 100%  Left rotation 100%   (Blank rows = not tested)  LOWER EXTREMITY ROM:     Active  Right eval Left eval  Hip flexion    Hip extension    Hip abduction    Hip adduction    Hip internal rotation    Hip external rotation    Knee flexion    Knee extension    Ankle dorsiflexion    Ankle plantarflexion    Ankle inversion    Ankle eversion     (  Blank rows = not tested)  LOWER EXTREMITY MMT:    MMT Right eval Left eval  Hip flexion 4+ 4+  Hip extension 4 4  Hip abduction 4 4  Hip adduction 4 4  Hip internal rotation NT  NT  Hip external rotation NT NT  Knee flexion 4 4  Knee extension 4 4  Ankle dorsiflexion 4 4  Ankle plantarflexion    Ankle inversion    Ankle eversion     (Blank rows = not tested)  LUMBAR SPECIAL TESTS:  Straight leg raise test: Negative, FABER test: Positive, and FADIR Negative   FUNCTIONAL TESTS:  Squat NT Single Leg R/L 5 sec/ NT     GAIT: Distance walked: 35 ft   Assistive device utilized: None Level of assistance: Complete Independence Comments: No gait deficits noted    TREATMENT DATE: 10/22/23: JERRE   Modified Thomas Stretch  2 x 60 sec    Supine Bridges 1 x 10  Supine Bridges with red band for resisted ER  1 x 10                                                                                                                                  PATIENT EDUCATION:  Education details: Form and technique for correct performance of exercise and explanation about underlying deficits.   Person educated: Patient Education method: Explanation, Demonstration, Verbal cues, and Handouts Education  comprehension: verbalized understanding, returned demonstration, and verbal cues required  HOME EXERCISE PROGRAM: Access Code: [email or web address] URL: [email or web address] Date: 10/22/2023 Prepared by: Toribio Servant  Exercises - Supine Bridge with Resistance Band  - 3-4 x weekly - 3 sets - 10 reps - Thomas Stretch on Table  - 1 x daily - 7 x weekly - 3 reps - 60 sec  hold - Energy manager on Table (Mirrored)  - 1 x daily - 7 x weekly - 3 reps - 60 sec  hold  ASSESSMENT:  CLINICAL IMPRESSION: Patient is a 68 y.o. white female who was seen today for physical therapy evaluation and treatment for chronic right sided low back pain in the setting of spondylolisthesis. Lumbar side bending was only movement to reproduce concordant pain. She has minimal disability, but this is likely because she is tolerating symptoms. Despite pain being localized to right PSIS, SIJ dysfunction ruled out with negative Gaenslen's test. She has signs and symptoms that make her most appropriate for the movement control treatment based classification group with moderate pain and minimal disability. She has deficits that include increased pain with activity and tension in right thoracic paraspinal and decreased sensation along right posterior hip. She will benefit from skilled PT to address these aforementioned deficits to carry out physical activity like bending and lifting and walking required for her to maintain her home and care for herself so she can continue to remain independent.   OBJECTIVE IMPAIRMENTS: decreased mobility, difficulty walking, hypomobility, impaired flexibility, impaired sensation,  improper body mechanics, and pain.   ACTIVITY LIMITATIONS: carrying, lifting, bending, sitting, standing, squatting, and locomotion level  PARTICIPATION LIMITATIONS: cleaning, laundry, shopping, community activity, and yard work  PERSONAL FACTORS: Age, Fitness, Time since onset of injury/illness/exacerbation, and 3+  comorbidities: Major depression, HLD, HTN, T2DM are also affecting patient's functional outcome.   REHAB POTENTIAL: Good  CLINICAL DECISION MAKING: Stable/uncomplicated  EVALUATION COMPLEXITY: Low   GOALS: Goals reviewed with patient? No  SHORT TERM GOALS: Target date: 11/05/2023  Patient will demonstrate undestanding of home exercise plan by performing exercises correctly with evidence of good carry over with min to no verbal or tactile cues .   Baseline: NT  Goal status: INITIAL  2.  Patient will be able demonstrate how to correctly perform hip hinge to avoid placing increased strain on her low back when bending forward to avoid further symptom exacerbation while performing bending and lifting tasks required to maintain her home.  Baseline: Does not know how to perform   Goal status: INITIAL   LONG TERM GOALS: Target date: 01/14/2024  Patient will improve modified Oswestry Disability Index (MODI) score by decreasing initial score by >=5% as evidence of the minimal statistically significant change for improvement with low back pain disability and improvement in low back function (Copay et al, 2008) Baseline:  10% or 5/50    Goal status: INITIAL  2.  Patient will decrease her right sided low back pain with physical activity like walking and standing to <=5/10 as evidence of improved lumbar function and increased function to maintain her home and remain independent  Baseline: 8/10 right sided low back pain   Goal status: INITIAL  3.  Pt will increase by at least 17m (112ft) in order to demonstrate clinically significant improvement in lumbar function and community ambulation.    Baseline: 8/10 right sided low back pain   Goal status: INITIAL   PLAN:  PT FREQUENCY: 1-2x/week  PT DURATION: 12 weeks  PLANNED INTERVENTIONS: 97164- PT Re-evaluation, 97750- Physical Performance Testing, 97110-Therapeutic exercises, 97530- Therapeutic activity, [id number]- Neuromuscular  re-education, 97535- Self Care, 02859- Manual therapy, [id number]- Gait training, [phone]- Orthotic Initial, [phone]- Canalith repositioning, H9716- Electrical stimulation (unattended), [phone]- Electrical stimulation (manual), [id number]- Traction (mechanical), 20560 (1-2 muscles), 20561 (3+ muscles)- Dry Needling, Patient/Family education, Balance training, Stair training, Taping, Joint mobilization, Joint manipulation, Spinal manipulation, Spinal mobilization, DME instructions, Cryotherapy, and Moist heat.  PLAN FOR NEXT SESSION: Can probably remove thomas stretch. . Review hip hinge.    Toribio Servant PT, DPT  [facility] [address] [place], [address] Phone: [phone]   Fax:  [phone]

## 2023-10-27 ENCOUNTER — Encounter: Admitting: Physical Therapy

## 2023-10-29 ENCOUNTER — Ambulatory Visit: Attending: Physical Medicine & Rehabilitation | Admitting: Physical Therapy

## 2023-10-29 DIAGNOSIS — M5459 Other low back pain: Secondary | ICD-10-CM | POA: Diagnosis not present

## 2023-10-29 DIAGNOSIS — M4316 Spondylolisthesis, lumbar region: Secondary | ICD-10-CM | POA: Insufficient documentation

## 2023-10-29 NOTE — Therapy (Signed)
 OUTPATIENT PHYSICAL THERAPY THORACOLUMBAR TREATMENT   Patient Name: [name] MRN: [id number] DOB:1955/02/14, 68 y.o., female Today's Date: 10/29/2023  END OF SESSION:  PT End of Session - 10/29/23 1611     Visit Number 2    Number of Visits 24    Date for Recertification  01/14/24    Authorization Type HTA [id number]    Authorization - Visit Number 2    Authorization - Number of Visits 24    Progress Note Due on Visit 10    PT Start Time 1515    PT Stop Time 1600    PT Time Calculation (min) 45 min    Activity Tolerance Patient tolerated treatment well    Behavior During Therapy WFL for tasks assessed/performed           Past Medical History:  Diagnosis Date   Anemia    Anxiety    Arthritis    Complication of anesthesia    Depression    Diabetes mellitus without complication (HCC)    GERD (gastroesophageal reflux disease)    Headache    Hypertension    PONV (postoperative nausea and vomiting)    Sleep apnea    Past Surgical History:  Procedure Laterality Date   ABDOMINAL HYSTERECTOMY  2008   for Menorrhagia   BREAST REDUCTION SURGERY     COLONOSCOPY     COLONOSCOPY WITH PROPOFOL  N/A 06/20/2022   Procedure: COLONOSCOPY WITH PROPOFOL ;  Surgeon: Therisa Bi, MD;  Location: [facility];  Service: Gastroenterology;  Laterality: N/A;   COSMETIC SURGERY     LAPAROSCOPY     OPEN REDUCTION INTERNAL FIXATION (ORIF) DISTAL RADIAL FRACTURE Right 10/12/2020   Procedure: OPEN REDUCTION INTERNAL FIXATION (ORIF) DISTAL RADIAL FRACTURE;  Surgeon: Kathlynn Sharper, MD;  Location: [facility] ORS;  Service: Orthopedics;  Laterality: Right;   REDUCTION MAMMAPLASTY Bilateral 1991   TUBAL LIGATION     Patient Active Problem List   Diagnosis Date Noted   Lumbar radiculitis 08/20/2023   Diabetic peripheral neuropathy (HCC) 05/21/2023   Osteoarthritis of left midfoot 03/05/2023   Peroneal tendinitis, left 03/05/2023   Adenomatous polyp of colon 06/20/2022   Screening for colon  cancer 06/20/2022   Anemia 11/08/2021   Rash of body 09/28/2020   S/P hysterectomy 02/09/2020   Moderate episode of recurrent major depressive disorder (HCC) 02/09/2020   Neuropathy 07/30/2019   Osteopenia determined by x-ray 04/26/2019   Hyperlipidemia associated with type 2 diabetes mellitus (HCC) 07/15/2017   Type II diabetes mellitus with complication (HCC) 04/18/2017   Vitamin D  deficiency 04/18/2017   Obesity 02/28/2015   Hemorrhoids 03/04/2012   Insomnia 03/04/2012   Arthralgia 03/04/2012   Migraine without aura or status migrainosus 03/04/2012   Arthralgia of right temporomandibular joint 03/04/2012   Essential hypertension 11/08/2010    PCP: Dr. Leita Adie    REFERRING PROVIDER: Dr. Delon Gins     REFERRING DIAG:  M54.16 (ICD-10-CM) - Radiculopathy, lumbar region  M54.41 (ICD-10-CM) - Lumbago with sciatica, right side  G89.29 (ICD-10-CM) - Other chronic pain    Rationale for Evaluation and Treatment: Rehabilitation  THERAPY DIAG:  Other low back pain  Spondylolisthesis of lumbar region  ONSET DATE:  SUBJECTIVE:  SUBJECTIVE STATEMENT: Pt states that she felt a lot of pain in right side of her low back after last session and she is not sure why.   PERTINENT HISTORY: 10/10/23 Dr. Dodson note Chronic low back pain with right lumbar radiculitis -s/p right S1 TFESI 08/21/2023 with temporary relief -s/p right S1 TFESI with Celestone 09/25/2023 with mild relief    PAIN:  Are you having pain? Yes: NPRS scale: 8/10  Pain location: Right lumbar paraspinal to hamstring down to posterior knee    Pain description: Sharp   Aggravating factors: Walking or sitting for a long time or lifting something heavy and vibrations from riding lawn mower    Relieving factors: Laying flat on  her back at night and not turning     PRECAUTIONS: None  RED FLAGS: None   WEIGHT BEARING RESTRICTIONS: No  FALLS:  Has patient fallen in last 6 months? No  LIVING ENVIRONMENT: Lives with: lives alone Lives in: House/apartment Stairs: Yes: External: 3 steps; on right going up and on left going up Has following equipment at home: None  OCCUPATION: Retired     PLOF: Independent  PATIENT GOALS: She wants to hurt less and not limit her from walking.    NEXT MD VISIT: Nothing schedule     OBJECTIVE:  Note: Objective measures were completed at Evaluation unless otherwise noted.  VITALS  BP 127/65 HR 72 Spo2 99  DIAGNOSTIC FINDINGS:  CLINICAL DATA:  68 year old female with neurogenic claudication. Lower extremity pain. Chronic low back pain. L5-S1 spondylolisthesis.   EXAM: MRI LUMBAR SPINE WITHOUT CONTRAST   TECHNIQUE: Multiplanar, multisequence MR imaging of the lumbar spine was performed. No intravenous contrast was administered.   COMPARISON:  None Available.   FINDINGS: Segmentation: Lumbar segmentation appears to be normal and will be designated as such for this report.   Alignment: Grade 1 anterolisthesis of L5 on S1 measuring 5-6 mm. Mildly exaggerated lower lumbar lordosis associated. Subtle underlying dextroconvex lumbar scoliosis.   Vertebrae: Normal background bone marrow signal. Chronic bilateral L5 pars fractures. Mostly chronic degenerative endplate changes at L5-S1, faint degenerative endplate marrow edema there. Benign vertebral body hemangiomas, largest at T12 (normal variant). Intact visible sacrum and SI joints. No other acute osseous abnormality.   Conus medullaris and cauda equina: Conus extends to the T12-L1 level. No lower spinal cord or conus signal abnormality. Normal cauda equina nerve roots.   Paraspinal and other soft tissues: Negative.   Disc levels:   T10-T11: Negative disc. Mild facet hypertrophy greater on the right.    T11-T12: Normal for age.   T12-L1:  Normal for age.   L1-L2:  Normal for age.   L2-L3:  Normal for age.   L3-L4: Disc space loss. Mild circumferential disc bulge and endplate spurring. Mild facet and ligament flavum hypertrophy greater on the left. No spinal or lateral recess stenosis. Mild L3 neural foraminal stenosis primarily on the left.   L4-L5: Better preserved disc height. Mild disc bulging. Mild to moderate facet hypertrophy. No stenosis.   L5-S1: Spondylo lysis. Grade 1 spondylolisthesis. Moderate to severe disc space loss. Circumferential disc/pseudo disc. Endplate spurring. Moderate to severe posterior element hypertrophy. No spinal or lateral recess stenosis. Moderate to severe bilateral L5 foraminal stenosis.   IMPRESSION: 1. Chronic L5 spondylolysis with grade 1 spondylolisthesis. Advanced disc and posterior element degeneration with moderate to severe bilateral L5 neural foraminal stenosis. 2. Mild for age lumbar spine degeneration elsewhere. No lumbar spinal or lateral recess stenosis.  Electronically Signed   By: VEAR Hurst M.D.   On: 08/15/2023 09:45  PATIENT SURVEYS:  Modified Oswestry:  MODIFIED OSWESTRY DISABILITY SCALE  Date: 10/22/23 Score  Pain intensity 1 = The pain is bad, but I can manage without having to take (1) I can stand as long as I want but, it increases my pain. pain medication.  2. Personal care (washing, dressing, etc.) 0 =  I can take care of myself normally without causing increased pain.  3. Lifting 1 = I can lift heavy weights, but it causes increased pain.  4. Walking 2 =  Pain prevents me from walking more than  mile.  5. Sitting 2 =  Pain prevents me from sitting more than 1 hour.  6. Standing 1 =  I can stand as long as I want but, it increases my pain.  7. Sleeping 0 = Pain does not prevent me from sleeping well.  8. Social Life 0 = My social life is normal and does not increase my pain.  9. Traveling 0 =  I can travel  anywhere without increased pain.  10. Employment/ Homemaking 1 = My normal homemaking/job activities increase my pain, but I can still perform all that is required of me  Total 8/50 (16%)   Interpretation of scores: Score Category Description  0-20% Minimal Disability The patient can cope with most living activities. Usually no treatment is indicated apart from advice on lifting, sitting and exercise  21-40% Moderate Disability The patient experiences more pain and difficulty with sitting, lifting and standing. Travel and social life are more difficult and they may be disabled from work. Personal care, sexual activity and sleeping are not grossly affected, and the patient can usually be managed by conservative means  41-60% Severe Disability Pain remains the main problem in this group, but activities of daily living are affected. These patients require a detailed investigation  61-80% Crippled Back pain impinges on all aspects of the patient's life. Positive intervention is required  81-100% Bed-bound  These patients are either bed-bound or exaggerating their symptoms  Bluford FORBES Zoe DELENA Karon DELENA, et al. Surgery versus conservative management of stable thoracolumbar fracture: the PRESTO feasibility RCT. [place] ([place]): [employer]; 2021 Nov. [facility] Technology Assessment, No. 25.62.) Appendix 3, Oswestry Disability Index category descriptors. Available from: FindJewelers.cz  Minimally Clinically Important Difference (MCID) = 12.8%  COGNITION: Overall cognitive status: Within functional limits for tasks assessed     SENSATION: WFL  MUSCLE LENGTH: Hamstrings: Right 90 deg; Left 90 deg Thomas test: 5 deg hip flexion bilateral    POSTURE: No Significant postural limitations  PALPATION: Right PSIS TTP    LUMBAR ROM:   AROM eval  Flexion 100%  Extension 100%  Right lateral flexion 100%*  Left lateral flexion 100%*  Right rotation 100%  Left  rotation 100%   (Blank rows = not tested)  LOWER EXTREMITY ROM:     Active  Right eval Left eval  Hip flexion    Hip extension    Hip abduction    Hip adduction    Hip internal rotation    Hip external rotation    Knee flexion    Knee extension    Ankle dorsiflexion    Ankle plantarflexion    Ankle inversion    Ankle eversion     (Blank rows = not tested)  LOWER EXTREMITY MMT:    MMT Right eval Left eval  Hip flexion 4+ 4+  Hip extension 4  4  Hip abduction 4 4  Hip adduction 4 4  Hip internal rotation NT  NT  Hip external rotation NT NT  Knee flexion 4 4  Knee extension 4 4  Ankle dorsiflexion 4 4  Ankle plantarflexion    Ankle inversion    Ankle eversion     (Blank rows = not tested)  LUMBAR SPECIAL TESTS:  Straight leg raise test: Negative, FABER test: Positive, and FADIR Negative   FUNCTIONAL TESTS:  Squat NT Single Leg R/L 5 sec/ NT     GAIT: Distance walked: 35 ft   Assistive device utilized: None Level of assistance: Complete Independence Comments: No gait deficits noted    TREATMENT DATE:   10/29/23:  PHYSICAL PERFORMANCE   : 1,500 ft  -NRPS 7/10 Right SIJ   [place]ann: -Able to complete all 5 levels of positions     NMR   Standing TA with visual input for symmetrical weight bearing  2 x 10 with 3 sec hold    THEREX  Palloff Press #10 on OMEGA Cable  1 x 10   -Pt reports increased right sided low back pain   Palloff with blue band 1 x 10    -Pt reports increased right sided low back pain  Eccentric Chair Leans 2 x 10   -min VC to sit at edge of her chair Eccentric Chair Leans while holding onto jug of water  2 x 10  -min VC to keep feet flat on floor                                                                                                                         PATIENT EDUCATION:  Education details: Form and technique for correct performance of exercise and explanation about underlying deficits.   Person educated:  Patient Education method: Explanation, Demonstration, Verbal cues, and Handouts Education comprehension: verbalized understanding, returned demonstration, and verbal cues required  HOME EXERCISE PROGRAM: Access Code: [email or web address] URL: [email or web address] Date: 10/22/2023 Prepared by: Toribio Servant  Exercises - Supine Bridge with Resistance Band  - 3-4 x weekly - 3 sets - 10 reps - Thomas Stretch on Table  - 1 x daily - 7 x weekly - 3 reps - 60 sec  hold - Energy manager on Table (Mirrored)  - 1 x daily - 7 x weekly - 3 reps - 60 sec  hold  ASSESSMENT:  CLINICAL IMPRESSION: Pt presents for initial treatment for right sided low back pain that appears to be SIJ dysfunction. She had increased pain after last session likely from provocative Gaenslen's test and modified thomas stretch, which provided increased stress and force through right SIJ. Modified HEP to exclude modified thomas stretch to decrease pain provocation. Despite pt's high baseline for abdominal strength, PT focused abdominal activation and strengthening to improve TA activation to promote neutral spine in standing and bending postures to decrease pain when pt is performing activities of daily living like walking and standing to maintain her independence cooking  and cleaning. She will benefit from skilled PT to address these aforementioned deficits to carry out physical activity like bending and lifting and walking required for her to maintain her home and care for herself so she can continue to remain independent.    OBJECTIVE IMPAIRMENTS: decreased mobility, difficulty walking, hypomobility, impaired flexibility, impaired sensation, improper body mechanics, and pain.   ACTIVITY LIMITATIONS: carrying, lifting, bending, sitting, standing, squatting, and locomotion level  PARTICIPATION LIMITATIONS: cleaning, laundry, shopping, community activity, and yard work  PERSONAL FACTORS: Age, Fitness, Time since onset of  injury/illness/exacerbation, and 3+ comorbidities: Major depression, HLD, HTN, T2DM are also affecting patient's functional outcome.   REHAB POTENTIAL: Good  CLINICAL DECISION MAKING: Stable/uncomplicated  EVALUATION COMPLEXITY: Low   GOALS: Goals reviewed with patient? No  SHORT TERM GOALS: Target date: 11/05/2023  Patient will demonstrate undestanding of home exercise plan by performing exercises correctly with evidence of good carry over with min to no verbal or tactile cues .   Baseline: NT 10/29/23:  Able to perform exercises independently   Goal status: ACHIEVED    2.  Patient will be able demonstrate how to correctly perform hip hinge to avoid placing increased strain on her low back when bending forward to avoid further symptom exacerbation while performing bending and lifting tasks required to maintain her home.  Baseline: Does not know how to perform   Goal status: ONGOING     LONG TERM GOALS: Target date: 01/14/2024  Patient will improve modified Oswestry Disability Index (MODI) score by decreasing initial score by >=5% as evidence of the minimal statistically significant change for improvement with low back pain disability and improvement in low back function (Copay et al, 2008) Baseline:  10% or 5/50    Goal status: INITIAL  2.  Patient will decrease her right sided low back pain with physical activity like walking and standing to <=5/10 as evidence of improved lumbar function and increased function to maintain her home and remain independent  Baseline: 8/10 right sided low back pain   Goal status: INITIAL  3.  Pt will increase by at least 24m (155ft) in order to demonstrate clinically significant improvement in lumbar function and community ambulation.    Baseline: 8/10 right sided low back pain   10/29/23: 1,500 ft with no increase in her baseline pain of 7/10  Goal status: DEFERRED      PLAN:  PT FREQUENCY: 1-2x/week  PT DURATION: 12 weeks  PLANNED  INTERVENTIONS: 97164- PT Re-evaluation, 97750- Physical Performance Testing, 97110-Therapeutic exercises, 97530- Therapeutic activity, [id number]- Neuromuscular re-education, 97535- Self Care, 02859- Manual therapy, [id number]- Gait training, [phone]- Orthotic Initial, [phone]- Canalith repositioning, H9716- Electrical stimulation (unattended), [id number]- Electrical stimulation (manual), [id number]- Traction (mechanical), 20560 (1-2 muscles), 20561 (3+ muscles)- Dry Needling, Patient/Family education, Balance training, Stair training, Taping, Joint mobilization, Joint manipulation, Spinal manipulation, Spinal mobilization, DME instructions, Cryotherapy, and Moist heat.  PLAN FOR NEXT SESSION: Can probably remove thomas stretch. . Review hip hinge.    Toribio Servant PT, DPT  [facility] [address] [place], [address] Phone: [phone]   Fax:  [phone]

## 2023-11-03 ENCOUNTER — Other Ambulatory Visit: Payer: Self-pay | Admitting: Internal Medicine

## 2023-11-04 ENCOUNTER — Encounter: Admitting: Physical Therapy

## 2023-11-04 ENCOUNTER — Other Ambulatory Visit: Payer: Self-pay

## 2023-11-04 MED ORDER — TIRZEPATIDE 5 MG/0.5ML ~~LOC~~ SOAJ: 5.0000 mg | SUBCUTANEOUS | 0 refills | Status: DC

## 2023-11-04 NOTE — Telephone Encounter (Signed)
 Requested medications are due for refill today.  unsure  Requested medications are on the active medications list.  yes  Last refill. 08/20/2023  Future visit scheduled.   no  Notes to clinic.  Medication is historical.    Requested Prescriptions  Pending Prescriptions Disp Refills   MOUNJARO  2.5 MG/0.5ML Pen [Pharmacy Med Name: MOUNJARO  2.5 MG/0.5 ML PEN]  4    Sig: INJECT 2.5 MG UNDER THE SKIN ONCE WEEKLY     Off-Protocol Failed - 11/04/2023  3:36 PM      Failed - Medication not assigned to a protocol, review manually.      Passed - Valid encounter within last 12 months    Recent Outpatient Visits           2 months ago Annual physical exam   [place] [facility], Leita DEL, MD   5 months ago Type II diabetes mellitus with complication [facility])   [place] [facility] at [facility], Leita DEL, MD   6 months ago Rash of body   [facility] at [facility], Selinda PARAS, MD   7 months ago Osteoarthritis of left midfoot   [facility] at [facility], Selinda PARAS, MD   8 months ago Osteoarthritis of left midfoot   [facility] at [facility], Selinda PARAS, MD       Future Appointments             In 1 month Raymund, Lauraine BROCKS, MD [facility]

## 2023-11-05 ENCOUNTER — Encounter: Admitting: Physical Therapy

## 2023-11-05 ENCOUNTER — Ambulatory Visit: Admitting: Physical Therapy

## 2023-11-05 DIAGNOSIS — M5459 Other low back pain: Secondary | ICD-10-CM | POA: Diagnosis not present

## 2023-11-05 DIAGNOSIS — M4316 Spondylolisthesis, lumbar region: Secondary | ICD-10-CM

## 2023-11-05 NOTE — Therapy (Signed)
 OUTPATIENT PHYSICAL THERAPY THORACOLUMBAR TREATMENT   Patient Name: [name] MRN: [id number] DOB:12/14/1955, 68 y.o., female Today's Date: 11/05/2023  END OF SESSION:  PT End of Session - 11/05/23 1027     Visit Number 3    Number of Visits 24    Date for Recertification  01/14/24    Authorization Type HTA [id number]    Authorization - Visit Number 3    Authorization - Number of Visits 24    Progress Note Due on Visit 10    PT Start Time 1030    PT Stop Time 1115    PT Time Calculation (min) 45 min    Activity Tolerance Patient tolerated treatment well    Behavior During Therapy WFL for tasks assessed/performed           Past Medical History:  Diagnosis Date   Anemia    Anxiety    Arthritis    Complication of anesthesia    Depression    Diabetes mellitus without complication (HCC)    GERD (gastroesophageal reflux disease)    Headache    Hypertension    PONV (postoperative nausea and vomiting)    Sleep apnea    Past Surgical History:  Procedure Laterality Date   ABDOMINAL HYSTERECTOMY  2008   for Menorrhagia   BREAST REDUCTION SURGERY     COLONOSCOPY     COLONOSCOPY WITH PROPOFOL  N/A 06/20/2022   Procedure: COLONOSCOPY WITH PROPOFOL ;  Surgeon: Therisa Bi, MD;  Location: [facility] ENDOSCOPY;  Service: Gastroenterology;  Laterality: N/A;   COSMETIC SURGERY     LAPAROSCOPY     OPEN REDUCTION INTERNAL FIXATION (ORIF) DISTAL RADIAL FRACTURE Right 10/12/2020   Procedure: OPEN REDUCTION INTERNAL FIXATION (ORIF) DISTAL RADIAL FRACTURE;  Surgeon: Kathlynn Sharper, MD;  Location: [facility] ORS;  Service: Orthopedics;  Laterality: Right;   REDUCTION MAMMAPLASTY Bilateral 1991   TUBAL LIGATION     Patient Active Problem List   Diagnosis Date Noted   Lumbar radiculitis 08/20/2023   Diabetic peripheral neuropathy (HCC) 05/21/2023   Osteoarthritis of left midfoot 03/05/2023   Peroneal tendinitis, left 03/05/2023   Adenomatous polyp of colon 06/20/2022   Screening for colon  cancer 06/20/2022   Anemia 11/08/2021   Rash of body 09/28/2020   S/P hysterectomy 02/09/2020   Moderate episode of recurrent major depressive disorder (HCC) 02/09/2020   Neuropathy 07/30/2019   Osteopenia determined by x-ray 04/26/2019   Hyperlipidemia associated with type 2 diabetes mellitus (HCC) 07/15/2017   Type II diabetes mellitus with complication (HCC) 04/18/2017   Vitamin D  deficiency 04/18/2017   Obesity 02/28/2015   Hemorrhoids 03/04/2012   Insomnia 03/04/2012   Arthralgia 03/04/2012   Migraine without aura or status migrainosus 03/04/2012   Arthralgia of right temporomandibular joint 03/04/2012   Essential hypertension 11/08/2010    PCP: Dr. Leita Adie    REFERRING PROVIDER: Dr. Delon Gins     REFERRING DIAG:  M54.16 (ICD-10-CM) - Radiculopathy, lumbar region  M54.41 (ICD-10-CM) - Lumbago with sciatica, right side  G89.29 (ICD-10-CM) - Other chronic pain    Rationale for Evaluation and Treatment: Rehabilitation  THERAPY DIAG:  Other low back pain  Spondylolisthesis of lumbar region  ONSET DATE:  SUBJECTIVE:  SUBJECTIVE STATEMENT: Pt reports ongoing right sided low back pain especially when turning from right side laying to on her back. She reports worse pain in mornings when waking up with pain radiating down to back of her knee.    PERTINENT HISTORY: 10/10/23 Dr. Dodson note Chronic low back pain with right lumbar radiculitis -s/p right S1 TFESI 08/21/2023 with temporary relief -s/p right S1 TFESI with Celestone 09/25/2023 with mild relief    PAIN:  Are you having pain? Yes: NPRS scale: 2-3/10 (currently), 9/10 (worst in morning)  Pain location: Right lumbar paraspinal to hamstring down to posterior knee    Pain description: Sharp and stabbing especially when  waking up in the morning .   Aggravating factors: Walking or sitting for a long time or lifting something heavy and vibrations from riding lawn mower    Relieving factors: Laying flat on her back at night and not turning     PRECAUTIONS: None  RED FLAGS: None   WEIGHT BEARING RESTRICTIONS: No  FALLS:  Has patient fallen in last 6 months? No  LIVING ENVIRONMENT: Lives with: lives alone Lives in: House/apartment Stairs: Yes: External: 3 steps; on right going up and on left going up Has following equipment at home: None  OCCUPATION: Retired     PLOF: Independent  PATIENT GOALS: She wants to hurt less and not limit her from walking.    NEXT MD VISIT: Nothing schedule     OBJECTIVE:  Note: Objective measures were completed at Evaluation unless otherwise noted.  VITALS  BP 127/65 HR 72 Spo2 99  DIAGNOSTIC FINDINGS:  CLINICAL DATA:  68 year old female with neurogenic claudication. Lower extremity pain. Chronic low back pain. L5-S1 spondylolisthesis.   EXAM: MRI LUMBAR SPINE WITHOUT CONTRAST   TECHNIQUE: Multiplanar, multisequence MR imaging of the lumbar spine was performed. No intravenous contrast was administered.   COMPARISON:  None Available.   FINDINGS: Segmentation: Lumbar segmentation appears to be normal and will be designated as such for this report.   Alignment: Grade 1 anterolisthesis of L5 on S1 measuring 5-6 mm. Mildly exaggerated lower lumbar lordosis associated. Subtle underlying dextroconvex lumbar scoliosis.   Vertebrae: Normal background bone marrow signal. Chronic bilateral L5 pars fractures. Mostly chronic degenerative endplate changes at L5-S1, faint degenerative endplate marrow edema there. Benign vertebral body hemangiomas, largest at T12 (normal variant). Intact visible sacrum and SI joints. No other acute osseous abnormality.   Conus medullaris and cauda equina: Conus extends to the T12-L1 level. No lower spinal cord or conus signal  abnormality. Normal cauda equina nerve roots.   Paraspinal and other soft tissues: Negative.   Disc levels:   T10-T11: Negative disc. Mild facet hypertrophy greater on the right.   T11-T12: Normal for age.   T12-L1:  Normal for age.   L1-L2:  Normal for age.   L2-L3:  Normal for age.   L3-L4: Disc space loss. Mild circumferential disc bulge and endplate spurring. Mild facet and ligament flavum hypertrophy greater on the left. No spinal or lateral recess stenosis. Mild L3 neural foraminal stenosis primarily on the left.   L4-L5: Better preserved disc height. Mild disc bulging. Mild to moderate facet hypertrophy. No stenosis.   L5-S1: Spondylo lysis. Grade 1 spondylolisthesis. Moderate to severe disc space loss. Circumferential disc/pseudo disc. Endplate spurring. Moderate to severe posterior element hypertrophy. No spinal or lateral recess stenosis. Moderate to severe bilateral L5 foraminal stenosis.   IMPRESSION: 1. Chronic L5 spondylolysis with grade 1 spondylolisthesis. Advanced disc and posterior element degeneration  with moderate to severe bilateral L5 neural foraminal stenosis. 2. Mild for age lumbar spine degeneration elsewhere. No lumbar spinal or lateral recess stenosis.     Electronically Signed   By: VEAR Hurst M.D.   On: 08/15/2023 09:45  PATIENT SURVEYS:  Modified Oswestry:  MODIFIED OSWESTRY DISABILITY SCALE  Date: 10/22/23 Score  Pain intensity 1 = The pain is bad, but I can manage without having to take (1) I can stand as long as I want but, it increases my pain. pain medication.  2. Personal care (washing, dressing, etc.) 0 =  I can take care of myself normally without causing increased pain.  3. Lifting 1 = I can lift heavy weights, but it causes increased pain.  4. Walking 2 =  Pain prevents me from walking more than  mile.  5. Sitting 2 =  Pain prevents me from sitting more than 1 hour.  6. Standing 1 =  I can stand as long as I want but, it  increases my pain.  7. Sleeping 0 = Pain does not prevent me from sleeping well.  8. Social Life 0 = My social life is normal and does not increase my pain.  9. Traveling 0 =  I can travel anywhere without increased pain.  10. Employment/ Homemaking 1 = My normal homemaking/job activities increase my pain, but I can still perform all that is required of me  Total 8/50 (16%)   Interpretation of scores: Score Category Description  0-20% Minimal Disability The patient can cope with most living activities. Usually no treatment is indicated apart from advice on lifting, sitting and exercise  21-40% Moderate Disability The patient experiences more pain and difficulty with sitting, lifting and standing. Travel and social life are more difficult and they may be disabled from work. Personal care, sexual activity and sleeping are not grossly affected, and the patient can usually be managed by conservative means  41-60% Severe Disability Pain remains the main problem in this group, but activities of daily living are affected. These patients require a detailed investigation  61-80% Crippled Back pain impinges on all aspects of the patient's life. Positive intervention is required  81-100% Bed-bound  These patients are either bed-bound or exaggerating their symptoms  Bluford FORBES Zoe DELENA Karon DELENA, et al. Surgery versus conservative management of stable thoracolumbar fracture: the PRESTO feasibility RCT. [place] ([place]): [employer]; 2021 Nov. [facility] Technology Assessment, No. 25.62.) Appendix 3, Oswestry Disability Index category descriptors. Available from: FindJewelers.cz  Minimally Clinically Important Difference (MCID) = 12.8%  COGNITION: Overall cognitive status: Within functional limits for tasks assessed     SENSATION: WFL  MUSCLE LENGTH: Hamstrings: Right 90 deg; Left 90 deg Thomas test: 5 deg hip flexion bilateral    POSTURE: No Significant postural  limitations  PALPATION: Right PSIS TTP    LUMBAR ROM:   AROM eval  Flexion 100%  Extension 100%  Right lateral flexion 100%*  Left lateral flexion 100%*  Right rotation 100%  Left rotation 100%   (Blank rows = not tested)  LOWER EXTREMITY ROM:     Active  Right eval Left eval  Hip flexion    Hip extension    Hip abduction    Hip adduction    Hip internal rotation    Hip external rotation    Knee flexion    Knee extension    Ankle dorsiflexion    Ankle plantarflexion    Ankle inversion    Ankle eversion     (  Blank rows = not tested)  LOWER EXTREMITY MMT:    MMT Right eval Left eval  Hip flexion 4+ 4+  Hip extension 4 4  Hip abduction 4 4  Hip adduction 4 4  Hip internal rotation NT  NT  Hip external rotation NT NT  Knee flexion 4 4  Knee extension 4 4  Ankle dorsiflexion 4 4  Ankle plantarflexion    Ankle inversion    Ankle eversion     (Blank rows = not tested)  LUMBAR SPECIAL TESTS:  Straight leg raise test: Negative, FABER test: Positive, and FADIR Negative   FUNCTIONAL TESTS:  Squat NT Single Leg R/L 5 sec/ NT     GAIT: Distance walked: 35 ft   Assistive device utilized: None Level of assistance: Complete Independence Comments: No gait deficits noted    TREATMENT DATE:   11/05/23: THERAC   TM at 1.2 mph for 5 min   Discussion about activity modification to avoid reaching down to pick weighted object.   -Pt already modifies activities as much as possible like selecting smaller objects to carry rather than large objects    THEREX   Supine Forward reaches for lower abdominal 1 x 10  - min VC to bring shoulder blades off mat  Supine Bridges with ball squeeze for hip adduction  1 x 10  -Pt reports pain in bilateral SIJ   Supine Bridges 1 x 10  Supine Bridges on 6 inch step 1 x 10   -Pt reports no increase in her back pain   Alternating Supine 90/90 Heel taps to target lower abdominals 2 x 10   Alternating Supine 90/90  Heel taps with  isometric shoulder flexion holding PVC pipe at 90 deg flexion 2 x 10   -mod VC and TC to coordinate movement     PATIENT EDUCATION:  Education details: Form and technique for correct performance of exercise and explanation about underlying deficits.   Person educated: Patient Education method: Explanation, Demonstration, Verbal cues, and Handouts Education comprehension: verbalized understanding, returned demonstration, and verbal cues required  HOME EXERCISE PROGRAM: Access Code: [email or web address] URL: [email or web address] Date: 11/05/2023 Prepared by: Toribio Servant  Exercises - Supine 90/90 Alternating Heel Touches with Posterior Pelvic Tilt  - 3-4 x weekly - 3 sets - 10 reps - Supine Double Leg Bridge on Box  - 3-4 x weekly - 3 sets - 10 reps - Standing Transverse Abdominis Contraction  - 1 x daily - 7 x weekly - 1 sets - 10 reps - 3 sec hold   CLINICAL IMPRESSION: Pt shows improvement with abdominal strength and pain tolerance with ability to perform progressed exercises without an increase in her pain. PT continued to focus on abdominal strengthening to relieve tension on spine and to decrease pain.   She will benefit from skilled PT to address these aforementioned deficits to carry out physical activity like bending and lifting and walking required for her to maintain her home and care for herself so she can continue to remain independent.    OBJECTIVE IMPAIRMENTS: decreased mobility, difficulty walking, hypomobility, impaired flexibility, impaired sensation, improper body mechanics, and pain.   ACTIVITY LIMITATIONS: carrying, lifting, bending, sitting, standing, squatting, and locomotion level  PARTICIPATION LIMITATIONS: cleaning, laundry, shopping, community activity, and yard work  PERSONAL FACTORS: Age, Fitness, Time since onset of injury/illness/exacerbation, and 3+ comorbidities: Major depression, HLD, HTN, T2DM are also affecting patient's functional outcome.    REHAB POTENTIAL: Good  CLINICAL DECISION MAKING: Stable/uncomplicated  EVALUATION COMPLEXITY:  Low   GOALS: Goals reviewed with patient? No  SHORT TERM GOALS: Target date: 11/05/2023  Patient will demonstrate undestanding of home exercise plan by performing exercises correctly with evidence of good carry over with min to no verbal or tactile cues .   Baseline: NT 10/29/23:  Able to perform exercises independently   Goal status: ACHIEVED    2.  Patient will be able demonstrate how to correctly perform hip hinge to avoid placing increased strain on her low back when bending forward to avoid further symptom exacerbation while performing bending and lifting tasks required to maintain her home.  Baseline: Does not know how to perform   Goal status: ONGOING     LONG TERM GOALS: Target date: 01/14/2024  Patient will improve modified Oswestry Disability Index (MODI) score by decreasing initial score by >=5% as evidence of the minimal statistically significant change for improvement with low back pain disability and improvement in low back function (Copay et al, 2008) Baseline:  10% or 5/50    Goal status: ONGOING    2.  Patient will decrease her right sided low back pain with physical activity like walking and standing to <=5/10 as evidence of improved lumbar function and increased function to maintain her home and remain independent  Baseline: 8/10 right sided low back pain   Goal status: ONGOING   3.  Pt will increase by at least 23m (170ft) in order to demonstrate clinically significant improvement in lumbar function and community ambulation.    Baseline: 8/10 right sided low back pain   10/29/23: 1,500 ft with no increase in her baseline pain of 7/10  Goal status: DEFERRED      PLAN:  PT FREQUENCY: 1-2x/week  PT DURATION: 12 weeks  PLANNED INTERVENTIONS: 97164- PT Re-evaluation, 97750- Physical Performance Testing, 97110-Therapeutic exercises, 97530- Therapeutic activity,  [id number]- Neuromuscular re-education, 97535- Self Care, 02859- Manual therapy, [id number]- Gait training, [id number]- Orthotic Initial, [phone]- Canalith repositioning, H9716- Electrical stimulation (unattended), [phone]- Electrical stimulation (manual), [id number]- Traction (mechanical), 20560 (1-2 muscles), 20561 (3+ muscles)- Dry Needling, Patient/Family education, Balance training, Stair training, Taping, Joint mobilization, Joint manipulation, Spinal manipulation, Spinal mobilization, DME instructions, Cryotherapy, and Moist heat.  PLAN FOR NEXT SESSION: Hip hinge with TA activation. Continue to progress hip and abdominal strength: eccentric deadlift.    Toribio Servant PT, DPT  [facility] [address] [place], [address] Phone: [phone]   Fax:  [phone]

## 2023-11-06 ENCOUNTER — Ambulatory Visit: Admitting: Physical Therapy

## 2023-11-09 IMAGING — MG MM DIGITAL SCREENING BILAT W/ TOMO AND CAD
6 of 10 series · 6 of 30 positions shown · non-contrast
Comparison: Previous exam(s).

CLINICAL DATA: Screening.

EXAM:
DIGITAL SCREENING BILATERAL MAMMOGRAM WITH TOMOSYNTHESIS AND CAD
TECHNIQUE: Bilateral screening digital craniocaudal and mediolateral oblique
mammograms were obtained. Bilateral screening digital breast
tomosynthesis was performed. The images were evaluated with
computer-aided detection.

[R CC synth-2D]
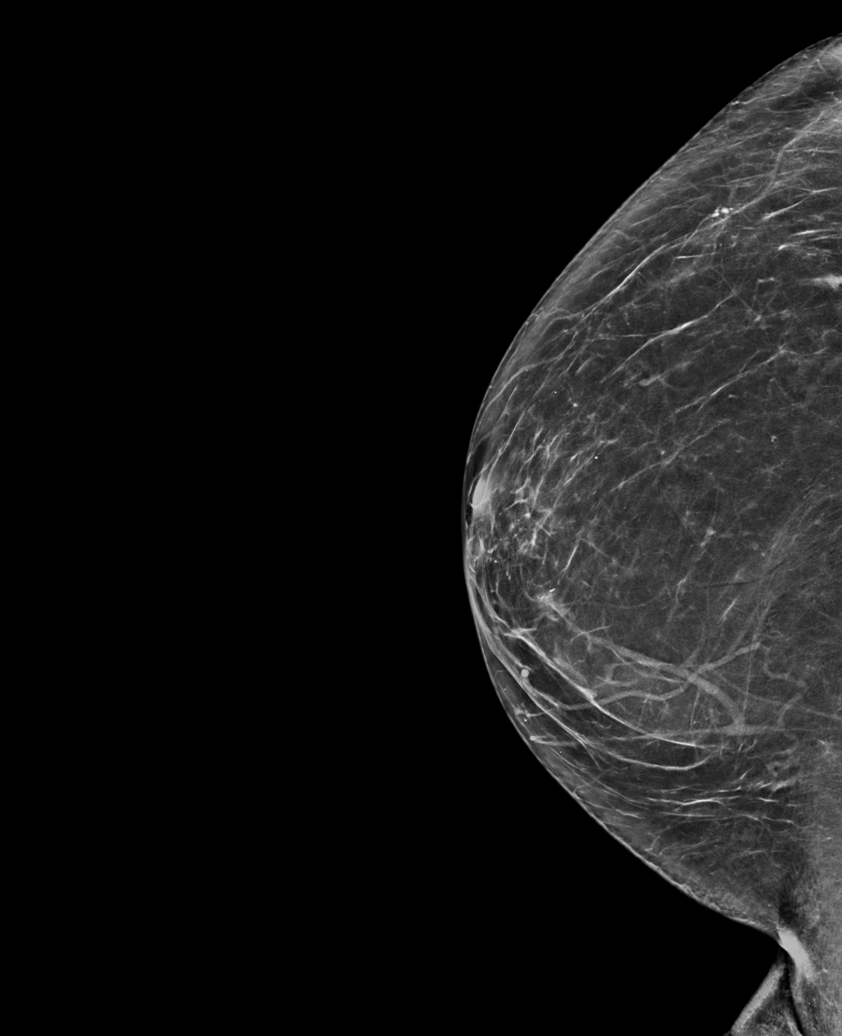

[R MLO synth-2D]
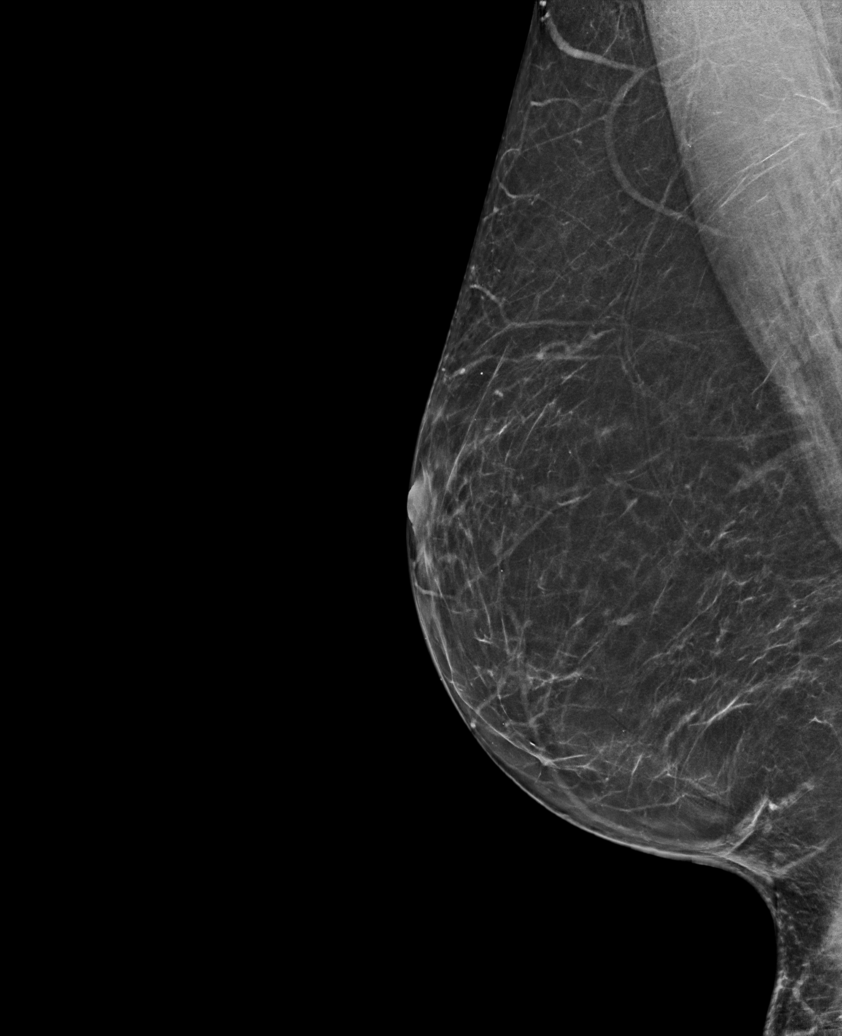

[L MLO synth-2D (1 of 2)]
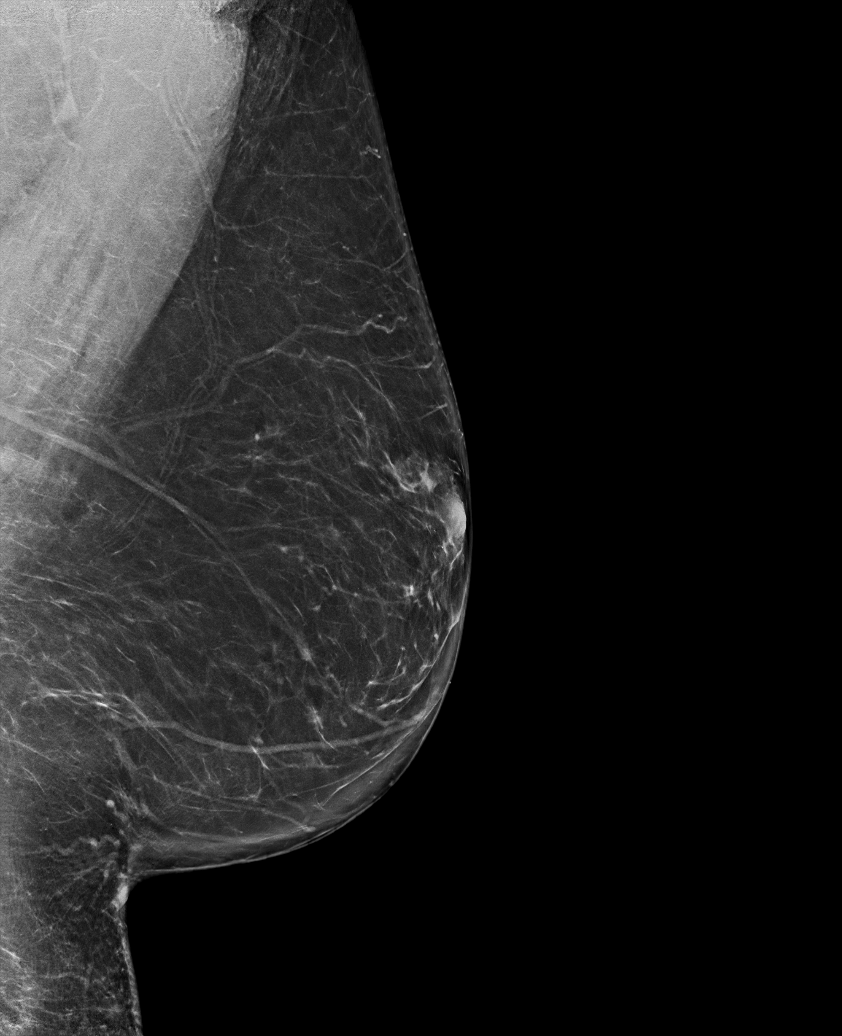

[L MLO synth-2D (2 of 2)]
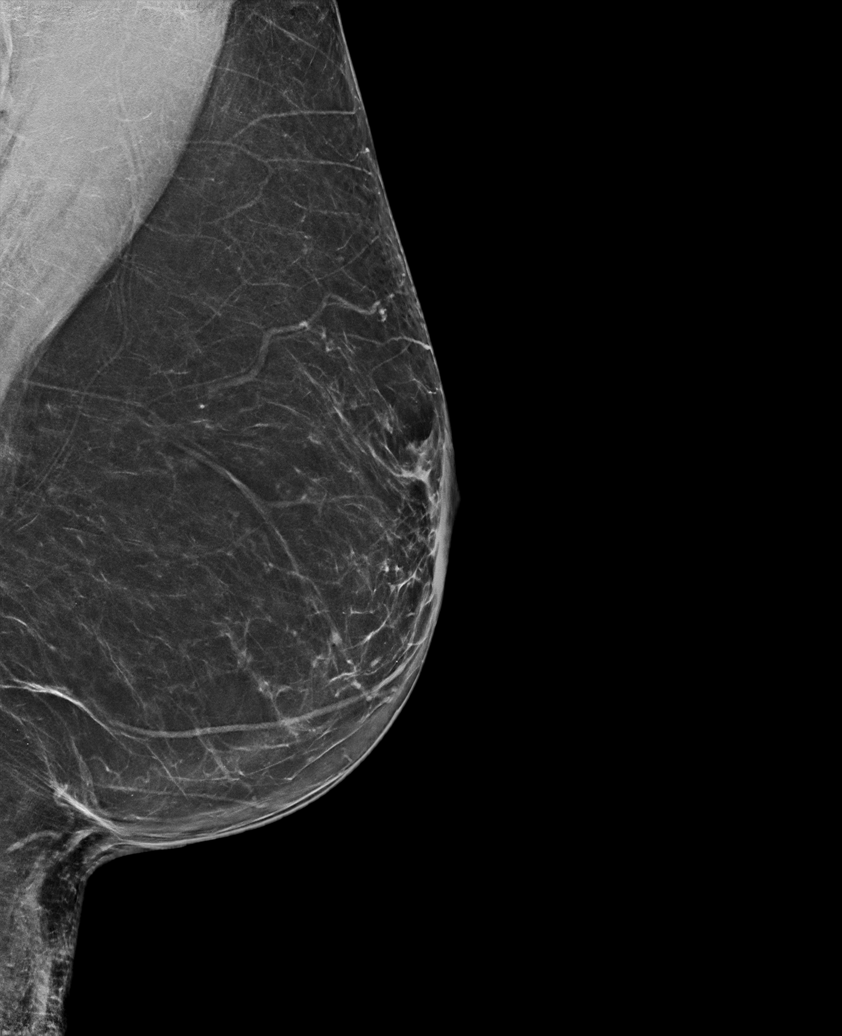

[L CC synth-2D]
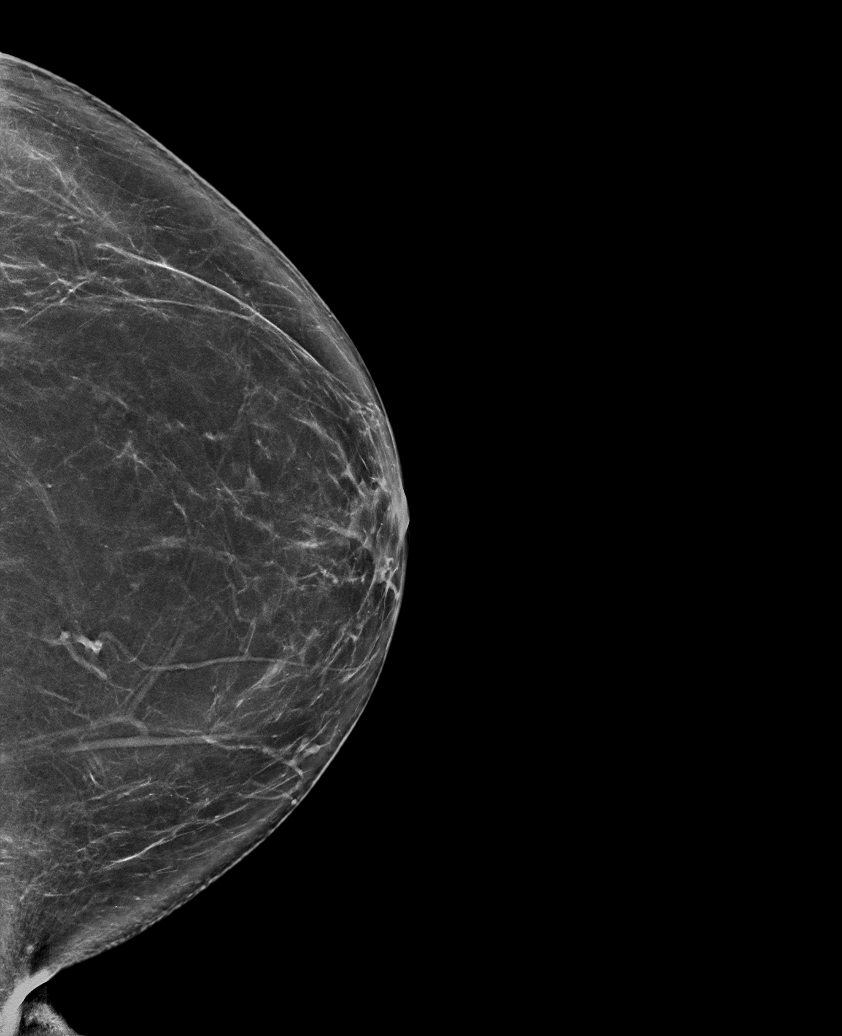

[L MLO tomo · tomo slice 45/88.0]
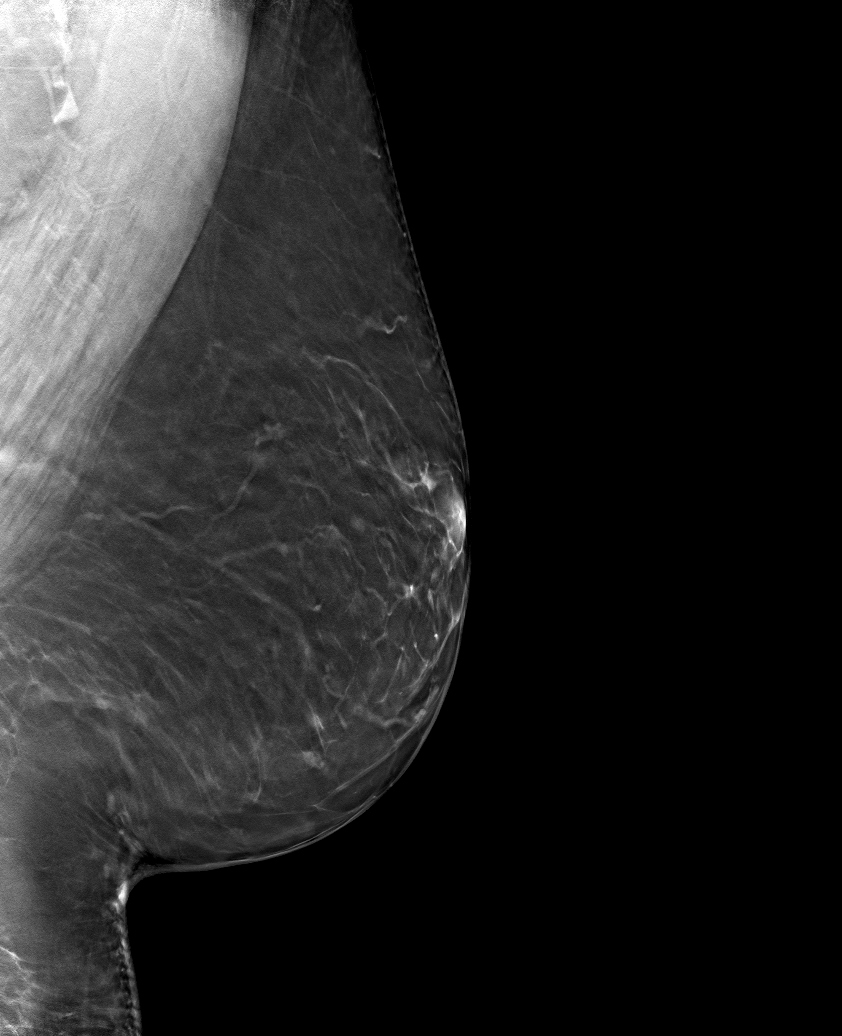

[6 of 30 positions shown; findings below may reference images not displayed]

ACR Breast Density Category b: There are scattered areas of
fibroglandular density.
FINDINGS: There are no findings suspicious for malignancy.
IMPRESSION: No mammographic evidence of malignancy. A result letter of this
screening mammogram will be mailed directly to the patient.

RECOMMENDATION:
Screening mammogram in one year. (Code:51-O-LD2)

BI-RADS CATEGORY  1: Negative.

## 2023-11-10 ENCOUNTER — Encounter: Admitting: Physical Therapy

## 2023-11-12 ENCOUNTER — Ambulatory Visit: Admitting: Physical Therapy

## 2023-11-13 ENCOUNTER — Telehealth: Payer: Self-pay

## 2023-11-13 NOTE — Telephone Encounter (Signed)
 Gave pt a call, pt is coming up due for re-enrollment for 2026 on Farxiga  will mail out pt portion and faxed provider portion left a HIPAA VM.

## 2023-11-17 ENCOUNTER — Encounter: Admitting: Physical Therapy

## 2023-11-19 ENCOUNTER — Encounter: Payer: Self-pay | Admitting: Pharmacist

## 2023-11-19 ENCOUNTER — Other Ambulatory Visit: Payer: Self-pay | Admitting: Pharmacist

## 2023-11-19 DIAGNOSIS — E1169 Type 2 diabetes mellitus with other specified complication: Secondary | ICD-10-CM

## 2023-11-19 DIAGNOSIS — E118 Type 2 diabetes mellitus with unspecified complications: Secondary | ICD-10-CM

## 2023-11-19 NOTE — Telephone Encounter (Signed)
 Received provider portion [place] Farxiga 

## 2023-11-19 NOTE — Progress Notes (Unsigned)
 11/19/2023 Name: [name] MRN: [id number] DOB: 09/25/1955  Chief Complaint  Patient presents with   Medication Assistance   Medication Management    [name] is a 68 y.o. year old female who presented for a telephone visit.   They were referred to the pharmacist by a quality report for assistance in managing diabetes and medication access.    Subjective:   Care Team: Primary Care Provider: Justus Leita DEL, MD ; Next Scheduled Visit: 12/24/2023    Medication Access/Adherence  Current Pharmacy:  [id number] - [place], [place] - [address] [place] [address] Phone: [phone] Fax: [phone]   Patient reports affordability concerns with their medications: No Patient reports access/transportation concerns to their pharmacy: No  Patient reports adherence concerns with their medications:  No       Diabetes:   Current medications:  - Farxiga  10 mg daily before breakfast - Mounjaro  5 mg weekly on Wednesdays (increased to current dose last week)   Medications tried in the past: metformin  (unable to tolerate); Ozempic  (nausea/GI symptoms)   Reports has noticed appetite control since started Mounjaro    Reports last checked morning fasting blood sugar yesterday, reading: 143; today: 135    Reports limiting sugary beverage (ginger ale) intake   Statin therapy: pravastatin  10 mg twice weekly  Current physical activity: limited by back pain; currently attending physical therapy once weekly and stays active around the home   Current medication access support: Enrolled in patient assistance for Farxiga  from [employer] through 01/28/2024 - Today reports completed re-enrollment steps via [employer] Digital Assistant and received confirmation that she was approved - Note CPhT Shasta Spear also mailed application to patient on 11/13/2023 and she has completed and mailed back to CPhT   Objective:  Lab Results  Component Value Date   HGBA1C  6.5 (H) 08/20/2023    Lab Results  Component Value Date   CREATININE 0.60 08/20/2023   BUN 13 08/20/2023   NA 142 08/20/2023   K 4.8 08/20/2023   CL 105 08/20/2023   CO2 22 08/20/2023    Lab Results  Component Value Date   CHOL 195 08/20/2023   HDL 45 08/20/2023   LDLCALC 124 (H) 08/20/2023   LDLDIRECT 161.1 11/08/2010   TRIG 144 08/20/2023   CHOLHDL 4.3 08/20/2023   BP Readings from Last 3 Encounters:  09/10/23 122/78  08/20/23 122/78  05/21/23 110/68   Pulse Readings from Last 3 Encounters:  08/20/23 (!) 57  05/21/23 84  05/08/23 74    Medications Reviewed Today     Reviewed by Alana Sharyle LABOR, RPH-CPP (Pharmacist) on 11/19/23 at 1040  Med List Status: <None>   Medication Order Taking? Sig Documenting Provider Last Dose Status Informant  Blood Glucose Monitoring Suppl DEVI [id number]  Use to test blood sugar twice daily. Justus Leita DEL, MD  Active   Cholecalciferol 25 MCG (1000 UT) TBDP [id number]  Take 1 tablet by mouth daily. [provider]  Active   Continuous Glucose Sensor (FREESTYLE LIBRE 3 PLUS SENSOR) MISC [id number]  Place 1 sensor on the skin every 15 days. Use to check glucose continuously Justus Leita DEL, MD  Active   dapagliflozin  propanediol (FARXIGA ) 10 MG TABS tablet [id number] Yes Take 1 tablet (10 mg total) by mouth daily. Justus Leita DEL, MD  Active    Patient not taking:   Discontinued 11/19/23 1040   DULoxetine  (CYMBALTA ) 60 MG capsule [id number]  Take 2 capsules (120 mg  total) by mouth daily. Justus Leita DEL, MD  Active   Glucose Blood (BLOOD GLUCOSE TEST STRIPS) STRP [id number]  Use to test blood sugar twice daily. Justus Leita DEL, MD  Active   glucose blood [facility] ULTRA) test strip [id number]  Use to test blood sugar twice a day Justus Leita DEL, MD  Active Self   Patient not taking:   Discontinued 11/19/23 1040   Insulin  Pen Needle (PEN NEEDLES) 31G X 5 MM MISC [id number]  1 each by Does not apply route once a week.  Justus Leita DEL, MD  Active   Lancet Device MISC [id number]  Use to test blood sugar twice daily. Justus Leita DEL, MD  Active   losartan -hydrochlorothiazide (HYZAAR) 100-12.5 MG tablet [id number]  Take 1 tablet by mouth daily. Justus Leita DEL, MD  Active   nebivolol  (BYSTOLIC ) 5 MG tablet [id number]  Take 1 tablet (5 mg total) by mouth daily. Justus Leita DEL, MD  Active   pravastatin  (PRAVACHOL ) 10 MG tablet [id number] Yes Take 1 tablet (10 mg total) by mouth 2 (two) times a week. Justus Leita DEL, MD  Active    Patient not taking:   Discontinued 11/19/23 1040   tirzepatide  (MOUNJARO ) 5 MG/0.5ML Pen [id number] Yes Inject 5 mg into the skin once a week. Justus Leita DEL, MD  Active   vitamin B-12 (CYANOCOBALAMIN ) 1000 MCG tablet [id number]  Take 1,000 mcg by mouth daily. [provider]  Active Self  zolpidem  (AMBIEN ) 10 MG tablet [id number]  Take 1 tablet (10 mg total) by mouth at bedtime as needed for sleep. Justus Leita DEL, MD  Active               Assessment/Plan:   Counsel patient that Medicare's Annual Enrollment Period for 2026 starts 11/12/2023. Encourage patient to review deductibles formulary coverage and copay amounts (including for Mounjaro ) between plans. Also share with patient the contact information for the [facility] Information Program [facility]) that can help with reviewing these Medicare plans   Diabetes: - Reviewed long term cardiovascular and renal outcomes of uncontrolled blood sugar - Reviewed goal A1c, goal fasting, and goal 2 hour post prandial glucose - Reviewed dietary modifications including encourage patient to have regular well-balanced meals and snacks, while controlling carbohydrate portion sizes - Patient to contact [employer] patient assistance program as needed for refills of Farxiga  - Recommend to continue to check glucose, keep log of results and have this record to review for future medical appointments     Follow Up Plan:  Clinical Pharmacist will follow up with patient by telephone again on 01/28/2024 at 10:00 AM    Sharyle Sia, PharmD, [facility] [phone]

## 2023-11-20 ENCOUNTER — Ambulatory Visit: Admitting: Physical Therapy

## 2023-11-20 NOTE — Patient Instructions (Signed)
 You can find more information about the [employer] Information Program [facility]) here:   [email or web address]   You can reach the [employer] for [facility] by calling:   Norleen Glenn [facility] [address]     [place]  [phone]     Ask for help with [facility]   Goals Addressed             This Visit's Progress    Pharmacy Goals       If you need to reach out to patient assistance programs regarding refills or to find out the status of your application, you can do so by calling:  [employer]Me at [phone]  Our goal A1c is less than 7%. This corresponds with fasting sugars less than 130 and 2 hour after meal sugars less than 180. Please keep a log of your results when checking your blood sugar   Our goal bad cholesterol, or LDL, is less than 70. This is why it is important to continue taking your pravastatin   Thank you!   Sharyle Sia, PharmD, [facility] [phone]

## 2023-11-24 ENCOUNTER — Telehealth: Payer: Self-pay | Admitting: Pharmacist

## 2023-11-24 ENCOUNTER — Other Ambulatory Visit: Payer: Self-pay | Admitting: Pharmacist

## 2023-11-24 ENCOUNTER — Other Ambulatory Visit: Payer: Self-pay | Admitting: Internal Medicine

## 2023-11-24 DIAGNOSIS — E1169 Type 2 diabetes mellitus with other specified complication: Secondary | ICD-10-CM

## 2023-11-24 MED ORDER — PRAVASTATIN SODIUM 10 MG PO TABS: 10.0000 mg | ORAL_TABLET | Freq: Every day | ORAL | 0 refills | Status: DC

## 2023-11-24 NOTE — Progress Notes (Signed)
   11/24/2023  Patient ID: [name], female   DOB: [birth year], 68 y.o.   MRN: [id number]  Receive a voicemail from patient requesting a call back. In her message, patient asks about: 1) Other support with Medicare re-enrollment as she found that the [facility] program appointments are fully booked.  2) Patient also requests an update regarding whether PCP agreed to adjustment of her pravastatin  prescription to allow patient to trial taking pravastatin  10 mg once daily  Send follow up message to PCP today regarding potential pravastatin  dose adjustment for cholesterol control/ASCVD risk reduction  - PCP agrees to have patient increase pravastatin  10 mg to once daily.  - Request provider send new prescription to pharmacy for patient  Return call to patient. Provide update regarding pravastatin  dosing. Also, provide patient with additional contact information for [facility] program as well as link for Medicare Plan Finder tool from [email or web address].  Sharyle Sia, PharmD, [facility] [phone]

## 2023-11-24 NOTE — Progress Notes (Unsigned)
 Date:  11/24/2023   Name:  [name]   DOB:  October 13, 1955   MRN:  [id number]   Chief Complaint: No chief complaint on file.  HPI  Review of Systems   Lab Results  Component Value Date   NA 142 08/20/2023   K 4.8 08/20/2023   CO2 22 08/20/2023   GLUCOSE 101 (H) 08/20/2023   BUN 13 08/20/2023   CREATININE 0.60 08/20/2023   CALCIUM 9.5 08/20/2023   EGFR 98 08/20/2023   GFRNONAA >60 10/11/2020   Lab Results  Component Value Date   CHOL 195 08/20/2023   HDL 45 08/20/2023   LDLCALC 124 (H) 08/20/2023   LDLDIRECT 161.1 11/08/2010   TRIG 144 08/20/2023   CHOLHDL 4.3 08/20/2023   Lab Results  Component Value Date   TSH 1.580 08/20/2023   Lab Results  Component Value Date   HGBA1C 6.5 (H) 08/20/2023   Lab Results  Component Value Date   WBC 7.8 08/20/2023   HGB 12.3 08/20/2023   HCT 41.0 08/20/2023   MCV 82 08/20/2023   PLT 347 08/20/2023   Lab Results  Component Value Date   ALT 24 08/20/2023   AST 24 08/20/2023   ALKPHOS 146 (H) 08/20/2023   BILITOT 0.2 08/20/2023   Lab Results  Component Value Date   VD25OH 35.8 08/20/2023     Patient Active Problem List   Diagnosis Date Noted   Lumbar radiculitis 08/20/2023   Diabetic peripheral neuropathy (HCC) 05/21/2023   Osteoarthritis of left midfoot 03/05/2023   Peroneal tendinitis, left 03/05/2023   Adenomatous polyp of colon 06/20/2022   Screening for colon cancer 06/20/2022   Anemia 11/08/2021   Rash of body 09/28/2020   S/P hysterectomy 02/09/2020   Moderate episode of recurrent major depressive disorder (HCC) 02/09/2020   Neuropathy 07/30/2019   Osteopenia determined by x-ray 04/26/2019   Hyperlipidemia associated with type 2 diabetes mellitus (HCC) 07/15/2017   Type II diabetes mellitus with complication (HCC) 04/18/2017   Vitamin D  deficiency 04/18/2017   Obesity 02/28/2015   Hemorrhoids 03/04/2012   Insomnia 03/04/2012   Arthralgia 03/04/2012   Migraine without aura or status  migrainosus 03/04/2012   Arthralgia of right temporomandibular joint 03/04/2012   Essential hypertension 11/08/2010    Allergies  Allergen Reactions   Amlodipine  Swelling    And headache    Past Surgical History:  Procedure Laterality Date   ABDOMINAL HYSTERECTOMY  2008   for Menorrhagia   BREAST REDUCTION SURGERY     COLONOSCOPY     COLONOSCOPY WITH PROPOFOL  N/A 06/20/2022   Procedure: COLONOSCOPY WITH PROPOFOL ;  Surgeon: Therisa Bi, MD;  Location: [facility] ENDOSCOPY;  Service: Gastroenterology;  Laterality: N/A;   COSMETIC SURGERY     LAPAROSCOPY     OPEN REDUCTION INTERNAL FIXATION (ORIF) DISTAL RADIAL FRACTURE Right 10/12/2020   Procedure: OPEN REDUCTION INTERNAL FIXATION (ORIF) DISTAL RADIAL FRACTURE;  Surgeon: Kathlynn Sharper, MD;  Location: [facility] ORS;  Service: Orthopedics;  Laterality: Right;   REDUCTION MAMMAPLASTY Bilateral 1991   TUBAL LIGATION      Social History   Tobacco Use   Smoking status: Never   Smokeless tobacco: Never  Vaping Use   Vaping status: Never Used  Substance Use Topics   Alcohol use: Not Currently    Alcohol/week: 2.0 standard drinks of alcohol    Types: 2 Glasses of wine per week   Drug use: Never     Medication list has been reviewed and updated.  No outpatient  medications have been marked as taking for the 11/24/23 encounter (Orders Only) with Justus Leita DEL, MD.       08/20/2023   10:37 AM 05/21/2023    3:02 PM 02/12/2023    1:34 PM 10/11/2022    3:18 PM  GAD 7 : Generalized Anxiety Score  Nervous, Anxious, on Edge 1 1 1 1   Control/stop worrying 0 0 1 1  Worry too much - different things 0 1 1 1   Trouble relaxing 2 1 0 1  Restless 0 0 0 1  Easily annoyed or irritable 2 1 2 2   Afraid - awful might happen 0 0 0 0  Total GAD 7 Score 5 4 5 7   Anxiety Difficulty Not difficult at all Not difficult at all Somewhat difficult Not difficult at all       09/10/2023    4:04 PM 08/20/2023   10:36 AM 05/21/2023    3:01 PM  Depression  screen PHQ 2/9  Decreased Interest 0 1 1  Down, Depressed, Hopeless 0 1 1  PHQ - 2 Score 0 2 2  Altered sleeping 0 3 3  Tired, decreased energy 0 3 3  Change in appetite 0 0 1  Feeling bad or failure about yourself  0 0 1  Trouble concentrating 0 1 2  Moving slowly or fidgety/restless 0 0 0  Suicidal thoughts 0 0 0  PHQ-9 Score 0 9 12  Difficult doing work/chores Not difficult at all Somewhat difficult     BP Readings from Last 3 Encounters:  09/10/23 122/78  08/20/23 122/78  05/21/23 110/68    Physical Exam  Wt Readings from Last 3 Encounters:  09/10/23 182 lb (82.6 kg)  08/20/23 184 lb (83.5 kg)  05/21/23 187 lb (84.8 kg)    There were no vitals taken for this visit.  Assessment and Plan:  Problem List Items Addressed This Visit   None   No follow-ups on file.    Leita HILARIO Justus, MD [facility] [place]

## 2023-11-24 NOTE — Progress Notes (Signed)
 I apologize if this is a duplicate message.  During follow up with patient, noticed latest LDL remains > goal of 70. Patient currently taking pravastatin  10 mg twice weekly and tolerating well, but interested in trialing an increase to taking daily if you agree.   Lab Results  Component Value Date   CHOL 195 08/20/2023   HDL 45 08/20/2023   LDLCALC 124 (H) 08/20/2023   LDLDIRECT 161.1 11/08/2010   TRIG 144 08/20/2023   CHOLHDL 4.3 08/20/2023     Thank you!   Sharyle Sia, PharmD, [facility] [phone]

## 2023-11-25 ENCOUNTER — Encounter: Admitting: Physical Therapy

## 2023-11-27 ENCOUNTER — Ambulatory Visit: Admitting: Physical Therapy

## 2023-11-27 ENCOUNTER — Encounter: Payer: Self-pay | Admitting: Physical Therapy

## 2023-11-27 DIAGNOSIS — M5459 Other low back pain: Secondary | ICD-10-CM | POA: Diagnosis not present

## 2023-11-27 DIAGNOSIS — M4316 Spondylolisthesis, lumbar region: Secondary | ICD-10-CM

## 2023-11-27 NOTE — Therapy (Signed)
 OUTPATIENT PHYSICAL THERAPY THORACOLUMBAR TREATMENT   Patient Name: [name] MRN: [id number] DOB:[birth year], 68 y.o., female Today's Date: 11/27/2023  END OF SESSION:  PT End of Session - 11/27/23 1308     Visit Number 4    Number of Visits 24    Date for Recertification  01/14/24    Authorization Type HTA [id number]    Authorization - Visit Number 4    Authorization - Number of Visits 24    Progress Note Due on Visit 10    PT Start Time 1300    PT Stop Time 1345    PT Time Calculation (min) 45 min    Activity Tolerance Patient tolerated treatment well    Behavior During Therapy WFL for tasks assessed/performed           Past Medical History:  Diagnosis Date   Anemia    Anxiety    Arthritis    Complication of anesthesia    Depression    Diabetes mellitus without complication (HCC)    GERD (gastroesophageal reflux disease)    Headache    Hypertension    PONV (postoperative nausea and vomiting)    Sleep apnea    Past Surgical History:  Procedure Laterality Date   ABDOMINAL HYSTERECTOMY  2008   for Menorrhagia   BREAST REDUCTION SURGERY     COLONOSCOPY     COLONOSCOPY WITH PROPOFOL  N/A 06/20/2022   Procedure: COLONOSCOPY WITH PROPOFOL ;  Surgeon: Therisa Bi, MD;  Location: [facility] ENDOSCOPY;  Service: Gastroenterology;  Laterality: N/A;   COSMETIC SURGERY     LAPAROSCOPY     OPEN REDUCTION INTERNAL FIXATION (ORIF) DISTAL RADIAL FRACTURE Right 10/12/2020   Procedure: OPEN REDUCTION INTERNAL FIXATION (ORIF) DISTAL RADIAL FRACTURE;  Surgeon: Kathlynn Sharper, MD;  Location: [facility] ORS;  Service: Orthopedics;  Laterality: Right;   REDUCTION MAMMAPLASTY Bilateral 1991   TUBAL LIGATION     Patient Active Problem List   Diagnosis Date Noted   Lumbar radiculitis 08/20/2023   Diabetic peripheral neuropathy (HCC) 05/21/2023   Osteoarthritis of left midfoot 03/05/2023   Peroneal tendinitis, left 03/05/2023   Adenomatous polyp of colon 06/20/2022   Screening for colon  cancer 06/20/2022   Anemia 11/08/2021   Rash of body 09/28/2020   S/P hysterectomy 02/09/2020   Moderate episode of recurrent major depressive disorder (HCC) 02/09/2020   Neuropathy 07/30/2019   Osteopenia determined by x-ray 04/26/2019   Hyperlipidemia associated with type 2 diabetes mellitus (HCC) 07/15/2017   Type II diabetes mellitus with complication (HCC) 04/18/2017   Vitamin D  deficiency 04/18/2017   Obesity 02/28/2015   Hemorrhoids 03/04/2012   Insomnia 03/04/2012   Arthralgia 03/04/2012   Migraine without aura or status migrainosus 03/04/2012   Arthralgia of right temporomandibular joint 03/04/2012   Essential hypertension 11/08/2010    PCP: Dr. Leita Adie    REFERRING PROVIDER: Dr. Delon Gins     REFERRING DIAG:  M54.16 (ICD-10-CM) - Radiculopathy, lumbar region  M54.41 (ICD-10-CM) - Lumbago with sciatica, right side  G89.29 (ICD-10-CM) - Other chronic pain    Rationale for Evaluation and Treatment: Rehabilitation  THERAPY DIAG:  Other low back pain  Spondylolisthesis of lumbar region  ONSET DATE:  SUBJECTIVE:  SUBJECTIVE STATEMENT: Pt states that her low back pain has not improved much since starting PT. She continues to feel more pain as the day goes on.    PERTINENT HISTORY: 10/10/23 Dr. Dodson note Chronic low back pain with right lumbar radiculitis -s/p right S1 TFESI 08/21/2023 with temporary relief -s/p right S1 TFESI with Celestone 09/25/2023 with mild relief    PAIN:  Are you having pain? Yes: NPRS scale: 2-3/10 (currently), 9/10 (worst in morning)  Pain location: Right lumbar paraspinal to hamstring down to posterior knee    Pain description: Sharp and stabbing especially when waking up in the morning .   Aggravating factors: Walking or sitting for a  long time or lifting something heavy and vibrations from riding lawn mower    Relieving factors: Laying flat on her back at night and not turning     PRECAUTIONS: None  RED FLAGS: None   WEIGHT BEARING RESTRICTIONS: No  FALLS:  Has patient fallen in last 6 months? No  LIVING ENVIRONMENT: Lives with: lives alone Lives in: House/apartment Stairs: Yes: External: 3 steps; on right going up and on left going up Has following equipment at home: None  OCCUPATION: Retired     PLOF: Independent  PATIENT GOALS: She wants to hurt less and not limit her from walking.    NEXT MD VISIT: Nothing schedule     OBJECTIVE:  Note: Objective measures were completed at Evaluation unless otherwise noted.  VITALS  BP 127/65 HR 72 Spo2 99  DIAGNOSTIC FINDINGS:  CLINICAL DATA:  68 year old female with neurogenic claudication. Lower extremity pain. Chronic low back pain. L5-S1 spondylolisthesis.   EXAM: MRI LUMBAR SPINE WITHOUT CONTRAST   TECHNIQUE: Multiplanar, multisequence MR imaging of the lumbar spine was performed. No intravenous contrast was administered.   COMPARISON:  None Available.   FINDINGS: Segmentation: Lumbar segmentation appears to be normal and will be designated as such for this report.   Alignment: Grade 1 anterolisthesis of L5 on S1 measuring 5-6 mm. Mildly exaggerated lower lumbar lordosis associated. Subtle underlying dextroconvex lumbar scoliosis.   Vertebrae: Normal background bone marrow signal. Chronic bilateral L5 pars fractures. Mostly chronic degenerative endplate changes at L5-S1, faint degenerative endplate marrow edema there. Benign vertebral body hemangiomas, largest at T12 (normal variant). Intact visible sacrum and SI joints. No other acute osseous abnormality.   Conus medullaris and cauda equina: Conus extends to the T12-L1 level. No lower spinal cord or conus signal abnormality. Normal cauda equina nerve roots.   Paraspinal and other soft  tissues: Negative.   Disc levels:   T10-T11: Negative disc. Mild facet hypertrophy greater on the right.   T11-T12: Normal for age.   T12-L1:  Normal for age.   L1-L2:  Normal for age.   L2-L3:  Normal for age.   L3-L4: Disc space loss. Mild circumferential disc bulge and endplate spurring. Mild facet and ligament flavum hypertrophy greater on the left. No spinal or lateral recess stenosis. Mild L3 neural foraminal stenosis primarily on the left.   L4-L5: Better preserved disc height. Mild disc bulging. Mild to moderate facet hypertrophy. No stenosis.   L5-S1: Spondylo lysis. Grade 1 spondylolisthesis. Moderate to severe disc space loss. Circumferential disc/pseudo disc. Endplate spurring. Moderate to severe posterior element hypertrophy. No spinal or lateral recess stenosis. Moderate to severe bilateral L5 foraminal stenosis.   IMPRESSION: 1. Chronic L5 spondylolysis with grade 1 spondylolisthesis. Advanced disc and posterior element degeneration with moderate to severe bilateral L5 neural foraminal stenosis. 2. Mild for  age lumbar spine degeneration elsewhere. No lumbar spinal or lateral recess stenosis.     Electronically Signed   By: VEAR Hurst M.D.   On: 08/15/2023 09:45  PATIENT SURVEYS:  Modified Oswestry:  MODIFIED OSWESTRY DISABILITY SCALE  Date: 10/22/23 Score  Pain intensity 1 = The pain is bad, but I can manage without having to take (1) I can stand as long as I want but, it increases my pain. pain medication.  2. Personal care (washing, dressing, etc.) 0 =  I can take care of myself normally without causing increased pain.  3. Lifting 1 = I can lift heavy weights, but it causes increased pain.  4. Walking 2 =  Pain prevents me from walking more than  mile.  5. Sitting 2 =  Pain prevents me from sitting more than 1 hour.  6. Standing 1 =  I can stand as long as I want but, it increases my pain.  7. Sleeping 0 = Pain does not prevent me from sleeping well.   8. Social Life 0 = My social life is normal and does not increase my pain.  9. Traveling 0 =  I can travel anywhere without increased pain.  10. Employment/ Homemaking 1 = My normal homemaking/job activities increase my pain, but I can still perform all that is required of me  Total 8/50 (16%)   Interpretation of scores: Score Category Description  0-20% Minimal Disability The patient can cope with most living activities. Usually no treatment is indicated apart from advice on lifting, sitting and exercise  21-40% Moderate Disability The patient experiences more pain and difficulty with sitting, lifting and standing. Travel and social life are more difficult and they may be disabled from work. Personal care, sexual activity and sleeping are not grossly affected, and the patient can usually be managed by conservative means  41-60% Severe Disability Pain remains the main problem in this group, but activities of daily living are affected. These patients require a detailed investigation  61-80% Crippled Back pain impinges on all aspects of the patient's life. Positive intervention is required  81-100% Bed-bound  These patients are either bed-bound or exaggerating their symptoms  Bluford FORBES Zoe DELENA Karon DELENA, et al. Surgery versus conservative management of stable thoracolumbar fracture: the PRESTO feasibility RCT. [place] ([place]): [employer]; 2021 Nov. [facility] Technology Assessment, No. 25.62.) Appendix 3, Oswestry Disability Index category descriptors. Available from: Findjewelers.cz  Minimally Clinically Important Difference (MCID) = 12.8%  COGNITION: Overall cognitive status: Within functional limits for tasks assessed     SENSATION: WFL  MUSCLE LENGTH: Hamstrings: Right 90 deg; Left 90 deg Thomas test: 5 deg hip flexion bilateral    POSTURE: No Significant postural limitations  PALPATION: Right PSIS TTP    LUMBAR ROM:   AROM eval  Flexion  100%  Extension 100%  Right lateral flexion 100%*  Left lateral flexion 100%*  Right rotation 100%  Left rotation 100%   (Blank rows = not tested)  LOWER EXTREMITY ROM:     Active  Right eval Left eval  Hip flexion    Hip extension    Hip abduction    Hip adduction    Hip internal rotation    Hip external rotation    Knee flexion    Knee extension    Ankle dorsiflexion    Ankle plantarflexion    Ankle inversion    Ankle eversion     (Blank rows = not tested)  LOWER EXTREMITY MMT:  MMT Right eval Left eval  Hip flexion 4+ 4+  Hip extension 4 4  Hip abduction 4 4  Hip adduction 4 4  Hip internal rotation NT  NT  Hip external rotation NT NT  Knee flexion 4 4  Knee extension 4 4  Ankle dorsiflexion 4 4  Ankle plantarflexion    Ankle inversion    Ankle eversion     (Blank rows = not tested)  LUMBAR SPECIAL TESTS:  Straight leg raise test: Negative, FABER test: Positive, and FADIR Negative   FUNCTIONAL TESTS:  Squat NT Single Leg R/L 5 sec/ NT     GAIT: Distance walked: 35 ft   Assistive device utilized: None Level of assistance: Complete Independence Comments: No gait deficits noted    TREATMENT DATE:   11/27/23:  THERAC    :  1,450 ft NRPS 1-2/10    TENS with box pattern and setting at 5    -Education about use of TENS to decrease pain with movement and explanation about TENS using handout from [employer]  Overground Ambulation 10 meters x 10  -Pt reports less pain and tightness in right side of body when walking    Dead Lift with #5 KB 3 x 10  -min VC to maintain upright posture    PATIENT EDUCATION:  Education details: Form and technique for correct performance of exercise and explanation about underlying deficits.   Person educated: Patient Education method: Explanation, Demonstration, Verbal cues, and Handouts Education comprehension: verbalized understanding, returned demonstration, and verbal cues required  HOME EXERCISE  PROGRAM: Access Code: [email or web address] URL: [email or web address] Date: 11/27/2023 Prepared by: Toribio Servant  Exercises - Supine 90/90 Alternating Heel Touches with Posterior Pelvic Tilt  - 3-4 x weekly - 3 sets - 10 reps - Standing Transverse Abdominis Contraction  - 1 x daily - 7 x weekly - 1 sets - 10 reps - 3 sec hold - Modified Deadlift and squeeze stomach muscles   - 3-4 x weekly - 3 sets - 10 reps   CLINICAL IMPRESSION: Pt exhibits decrease in right sided low back pain with use of TENS. Pt was able to walk without experiencing as much pain in low back. PT educated pt on using TENS with handout and how to place pads and PT to message referring physician for order of TENS. Pt to check with insurance to see if this is covered.  She will benefit from skilled PT to address these aforementioned deficits to carry out physical activity like bending and lifting and walking required for her to maintain her home and care for herself so she can continue to remain independent.   OBJECTIVE IMPAIRMENTS: decreased mobility, difficulty walking, hypomobility, impaired flexibility, impaired sensation, improper body mechanics, and pain.   ACTIVITY LIMITATIONS: carrying, lifting, bending, sitting, standing, squatting, and locomotion level  PARTICIPATION LIMITATIONS: cleaning, laundry, shopping, community activity, and yard work  PERSONAL FACTORS: Age, Fitness, Time since onset of injury/illness/exacerbation, and 3+ comorbidities: Major depression, HLD, HTN, T2DM are also affecting patient's functional outcome.   REHAB POTENTIAL: Good  CLINICAL DECISION MAKING: Stable/uncomplicated  EVALUATION COMPLEXITY: Low   GOALS: Goals reviewed with patient? No  SHORT TERM GOALS: Target date: 11/05/2023  Patient will demonstrate undestanding of home exercise plan by performing exercises correctly with evidence of good carry over with min to no verbal or tactile cues .   Baseline: NT 10/29/23:  Able to  perform exercises independently   Goal status: ACHIEVED    2.  Patient will be able  demonstrate how to correctly perform hip hinge to avoid placing increased strain on her low back when bending forward to avoid further symptom exacerbation while performing bending and lifting tasks required to maintain her home.  Baseline: Does not know how to perform  11/05/23: Patient able to perform hip hinge independently  Goal status: ACHIEVED     LONG TERM GOALS: Target date: 01/14/2024  Patient will improve modified Oswestry Disability Index (MODI) score by decreasing initial score by >=5% as evidence of the minimal statistically significant change for improvement with low back pain disability and improvement in low back function (Copay et al, 2008) Baseline:  10% or 5/50    Goal status: ONGOING    2.  Patient will decrease her right sided low back pain with physical activity like walking and standing to <=5/10 as evidence of improved lumbar function and increased function to maintain her home and remain independent  Baseline: 8/10 right sided low back pain  11/27/23: 2-3/10 NRPS right side of low back  Goal status: ONGOING   3.  Pt will increase by at least 24m (168ft) in order to demonstrate clinically significant improvement in lumbar function and community ambulation.    Baseline: 8/10 right sided low back pain   10/29/23: 1,500 ft with no increase in her baseline pain of 7/10  11/27/23: 1-2/10 NRPS with 1,450 ft     Goal status: DEFERRED      PLAN:  PT FREQUENCY: 1-2x/week  PT DURATION: 12 weeks  PLANNED INTERVENTIONS: 97164- PT Re-evaluation, 97750- Physical Performance Testing, 97110-Therapeutic exercises, 97530- Therapeutic activity, 97112- Neuromuscular re-education, 97535- Self Care, 02859- Manual therapy, [id number]- Gait training, [phone]- Orthotic Initial, [phone]- Canalith repositioning, [id number]- Electrical stimulation (unattended), [phone]- Electrical stimulation (manual), [id number]- Traction  (mechanical), 20560 (1-2 muscles), 20561 (3+ muscles)- Dry Needling, Patient/Family education, Balance training, Stair training, Taping, Joint mobilization, Joint manipulation, Spinal manipulation, Spinal mobilization, DME instructions, Cryotherapy, and Moist heat.  PLAN FOR NEXT SESSION: Reassess long term goals and to continue to use TENS with activity especially functional activities to see if patient is able to perform them better.    Toribio Servant PT, DPT  [facility] [address] [place], [address] Phone: [phone]   Fax:  [phone]

## 2023-12-01 ENCOUNTER — Encounter: Admitting: Physical Therapy

## 2023-12-03 ENCOUNTER — Ambulatory Visit: Attending: Physical Medicine & Rehabilitation

## 2023-12-03 ENCOUNTER — Encounter: Payer: Self-pay | Admitting: Physical Therapy

## 2023-12-03 DIAGNOSIS — M4316 Spondylolisthesis, lumbar region: Secondary | ICD-10-CM | POA: Diagnosis not present

## 2023-12-03 DIAGNOSIS — M5459 Other low back pain: Secondary | ICD-10-CM | POA: Diagnosis not present

## 2023-12-03 NOTE — Therapy (Signed)
 OUTPATIENT PHYSICAL THERAPY THORACOLUMBAR TREATMENT   Patient Name: [name] MRN: [id number] DOB:1955-10-19, 68 y.o., female Today's Date: 12/03/2023  END OF SESSION:  PT End of Session - 12/03/23 1344     Visit Number 5    Number of Visits 24    Date for Recertification  01/14/24    Authorization Type HTA [id number]    Authorization - Number of Visits 24    Progress Note Due on Visit 10    PT Start Time 1345    PT Stop Time 1426    PT Time Calculation (min) 41 min    Activity Tolerance Patient tolerated treatment well    Behavior During Therapy WFL for tasks assessed/performed            Past Medical History:  Diagnosis Date   Anemia    Anxiety    Arthritis    Complication of anesthesia    Depression    Diabetes mellitus without complication (HCC)    GERD (gastroesophageal reflux disease)    Headache    Hypertension    PONV (postoperative nausea and vomiting)    Sleep apnea    Past Surgical History:  Procedure Laterality Date   ABDOMINAL HYSTERECTOMY  2008   for Menorrhagia   BREAST REDUCTION SURGERY     COLONOSCOPY     COLONOSCOPY WITH PROPOFOL  N/A 06/20/2022   Procedure: COLONOSCOPY WITH PROPOFOL ;  Surgeon: Therisa Bi, MD;  Location: [facility] ENDOSCOPY;  Service: Gastroenterology;  Laterality: N/A;   COSMETIC SURGERY     LAPAROSCOPY     OPEN REDUCTION INTERNAL FIXATION (ORIF) DISTAL RADIAL FRACTURE Right 10/12/2020   Procedure: OPEN REDUCTION INTERNAL FIXATION (ORIF) DISTAL RADIAL FRACTURE;  Surgeon: Kathlynn Sharper, MD;  Location: [facility] ORS;  Service: Orthopedics;  Laterality: Right;   REDUCTION MAMMAPLASTY Bilateral 1991   TUBAL LIGATION     Patient Active Problem List   Diagnosis Date Noted   Lumbar radiculitis 08/20/2023   Diabetic peripheral neuropathy (HCC) 05/21/2023   Osteoarthritis of left midfoot 03/05/2023   Peroneal tendinitis, left 03/05/2023   Adenomatous polyp of colon 06/20/2022   Screening for colon cancer 06/20/2022   Anemia  11/08/2021   Rash of body 09/28/2020   S/P hysterectomy 02/09/2020   Moderate episode of recurrent major depressive disorder (HCC) 02/09/2020   Neuropathy 07/30/2019   Osteopenia determined by x-ray 04/26/2019   Hyperlipidemia associated with type 2 diabetes mellitus (HCC) 07/15/2017   Type II diabetes mellitus with complication (HCC) 04/18/2017   Vitamin D  deficiency 04/18/2017   Obesity 02/28/2015   Hemorrhoids 03/04/2012   Insomnia 03/04/2012   Arthralgia 03/04/2012   Migraine without aura or status migrainosus 03/04/2012   Arthralgia of right temporomandibular joint 03/04/2012   Essential hypertension 11/08/2010    PCP: Dr. Leita Adie    REFERRING PROVIDER: Dr. Delon Gins     REFERRING DIAG:  M54.16 (ICD-10-CM) - Radiculopathy, lumbar region  M54.41 (ICD-10-CM) - Lumbago with sciatica, right side  G89.29 (ICD-10-CM) - Other chronic pain    Rationale for Evaluation and Treatment: Rehabilitation  THERAPY DIAG:  Other low back pain  Spondylolisthesis of lumbar region  ONSET DATE:  SUBJECTIVE:  SUBJECTIVE STATEMENT:   Patient reports feeling increased pain in her R lower back near her pelvis. She had been wearing her TENS unit but removed right before coming in.   PERTINENT HISTORY: 10/10/23 Dr. Dodson note Chronic low back pain with right lumbar radiculitis -s/p right S1 TFESI 08/21/2023 with temporary relief -s/p right S1 TFESI with Celestone 09/25/2023 with mild relief    PAIN:  Are you having pain? Yes: NPRS scale: 2-3/10 (currently), 9/10 (worst in morning)  Pain location: Right lumbar paraspinal to hamstring down to posterior knee    Pain description: Sharp and stabbing especially when waking up in the morning .   Aggravating factors: Walking or sitting for a long time  or lifting something heavy and vibrations from riding lawn mower    Relieving factors: Laying flat on her back at night and not turning     PRECAUTIONS: None  RED FLAGS: None   WEIGHT BEARING RESTRICTIONS: No  FALLS:  Has patient fallen in last 6 months? No  LIVING ENVIRONMENT: Lives with: lives alone Lives in: House/apartment Stairs: Yes: External: 3 steps; on right going up and on left going up Has following equipment at home: None  OCCUPATION: Retired     PLOF: Independent  PATIENT GOALS: She wants to hurt less and not limit her from walking.    NEXT MD VISIT: Nothing schedule     OBJECTIVE:  Note: Objective measures were completed at Evaluation unless otherwise noted.  VITALS  BP 127/65 HR 72 Spo2 99  DIAGNOSTIC FINDINGS:  CLINICAL DATA:  68 year old female with neurogenic claudication. Lower extremity pain. Chronic low back pain. L5-S1 spondylolisthesis.   EXAM: MRI LUMBAR SPINE WITHOUT CONTRAST   TECHNIQUE: Multiplanar, multisequence MR imaging of the lumbar spine was performed. No intravenous contrast was administered.   COMPARISON:  None Available.   FINDINGS: Segmentation: Lumbar segmentation appears to be normal and will be designated as such for this report.   Alignment: Grade 1 anterolisthesis of L5 on S1 measuring 5-6 mm. Mildly exaggerated lower lumbar lordosis associated. Subtle underlying dextroconvex lumbar scoliosis.   Vertebrae: Normal background bone marrow signal. Chronic bilateral L5 pars fractures. Mostly chronic degenerative endplate changes at L5-S1, faint degenerative endplate marrow edema there. Benign vertebral body hemangiomas, largest at T12 (normal variant). Intact visible sacrum and SI joints. No other acute osseous abnormality.   Conus medullaris and cauda equina: Conus extends to the T12-L1 level. No lower spinal cord or conus signal abnormality. Normal cauda equina nerve roots.   Paraspinal and other soft tissues:  Negative.   Disc levels:   T10-T11: Negative disc. Mild facet hypertrophy greater on the right.   T11-T12: Normal for age.   T12-L1:  Normal for age.   L1-L2:  Normal for age.   L2-L3:  Normal for age.   L3-L4: Disc space loss. Mild circumferential disc bulge and endplate spurring. Mild facet and ligament flavum hypertrophy greater on the left. No spinal or lateral recess stenosis. Mild L3 neural foraminal stenosis primarily on the left.   L4-L5: Better preserved disc height. Mild disc bulging. Mild to moderate facet hypertrophy. No stenosis.   L5-S1: Spondylo lysis. Grade 1 spondylolisthesis. Moderate to severe disc space loss. Circumferential disc/pseudo disc. Endplate spurring. Moderate to severe posterior element hypertrophy. No spinal or lateral recess stenosis. Moderate to severe bilateral L5 foraminal stenosis.   IMPRESSION: 1. Chronic L5 spondylolysis with grade 1 spondylolisthesis. Advanced disc and posterior element degeneration with moderate to severe bilateral L5 neural foraminal stenosis. 2.  Mild for age lumbar spine degeneration elsewhere. No lumbar spinal or lateral recess stenosis.     Electronically Signed   By: VEAR Hurst M.D.   On: 08/15/2023 09:45  PATIENT SURVEYS:  Modified Oswestry:  MODIFIED OSWESTRY DISABILITY SCALE  Date: 10/22/23 Score  Pain intensity 1 = The pain is bad, but I can manage without having to take (1) I can stand as long as I want but, it increases my pain. pain medication.  2. Personal care (washing, dressing, etc.) 0 =  I can take care of myself normally without causing increased pain.  3. Lifting 1 = I can lift heavy weights, but it causes increased pain.  4. Walking 2 =  Pain prevents me from walking more than  mile.  5. Sitting 2 =  Pain prevents me from sitting more than 1 hour.  6. Standing 1 =  I can stand as long as I want but, it increases my pain.  7. Sleeping 0 = Pain does not prevent me from sleeping well.  8. Social  Life 0 = My social life is normal and does not increase my pain.  9. Traveling 0 =  I can travel anywhere without increased pain.  10. Employment/ Homemaking 1 = My normal homemaking/job activities increase my pain, but I can still perform all that is required of me  Total 8/50 (16%)   Interpretation of scores: Score Category Description  0-20% Minimal Disability The patient can cope with most living activities. Usually no treatment is indicated apart from advice on lifting, sitting and exercise  21-40% Moderate Disability The patient experiences more pain and difficulty with sitting, lifting and standing. Travel and social life are more difficult and they may be disabled from work. Personal care, sexual activity and sleeping are not grossly affected, and the patient can usually be managed by conservative means  41-60% Severe Disability Pain remains the main problem in this group, but activities of daily living are affected. These patients require a detailed investigation  61-80% Crippled Back pain impinges on all aspects of the patient's life. Positive intervention is required  81-100% Bed-bound  These patients are either bed-bound or exaggerating their symptoms  Bluford FORBES Zoe DELENA Karon DELENA, et al. Surgery versus conservative management of stable thoracolumbar fracture: the PRESTO feasibility RCT. [place] ([place]): [employer]; 2021 Nov. [facility] Technology Assessment, No. 25.62.) Appendix 3, Oswestry Disability Index category descriptors. Available from: Findjewelers.cz  Minimally Clinically Important Difference (MCID) = 12.8%  COGNITION: Overall cognitive status: Within functional limits for tasks assessed     SENSATION: WFL  MUSCLE LENGTH: Hamstrings: Right 90 deg; Left 90 deg Thomas test: 5 deg hip flexion bilateral    POSTURE: No Significant postural limitations  PALPATION: Right PSIS TTP    LUMBAR ROM:   AROM eval  Flexion 100%   Extension 100%  Right lateral flexion 100%*  Left lateral flexion 100%*  Right rotation 100%  Left rotation 100%   (Blank rows = not tested)  LOWER EXTREMITY ROM:     Active  Right eval Left eval  Hip flexion    Hip extension    Hip abduction    Hip adduction    Hip internal rotation    Hip external rotation    Knee flexion    Knee extension    Ankle dorsiflexion    Ankle plantarflexion    Ankle inversion    Ankle eversion     (Blank rows = not tested)  LOWER EXTREMITY  MMT:    MMT Right eval Left eval  Hip flexion 4+ 4+  Hip extension 4 4  Hip abduction 4 4  Hip adduction 4 4  Hip internal rotation NT  NT  Hip external rotation NT NT  Knee flexion 4 4  Knee extension 4 4  Ankle dorsiflexion 4 4  Ankle plantarflexion    Ankle inversion    Ankle eversion     (Blank rows = not tested)  LUMBAR SPECIAL TESTS:  Straight leg raise test: Negative, FABER test: Positive, and FADIR Negative   FUNCTIONAL TESTS:  Squat NT Single Leg R/L 5 sec/ NT     GAIT: Distance walked: 35 ft   Assistive device utilized: None Level of assistance: Complete Independence Comments: No gait deficits noted    TREATMENT DATE:  12/03/23:   TE:  Nustep level 4-2 x 5 minutes to improve truncal rotation and cardiorespiratory endurance - PT manually adjusted resistance throughout- moist heat pack applied to lower back  Lower trunk rotation x 10 each side  Therapist assisted hamstring stretch 2 x 30 seconds each side  Therapist assisted piriformis stretch 2 x 30 seconds each side Therapist assisted SKTC stretch 2 x 30 seconds each side  Seated hamstring stretch 2 x 30 seconds each side  Seated piriformis stretch 2 x 30 seconds each side  Seated fwd trunk flexion stretch with white physioball x 12, to L x 12, to R x 12  Seated white physioball squeeze for TrA activation 2 x 10 with 3 second hold   11/27/23:  THERAC    :  1,450 ft NRPS 1-2/10    TENS with box pattern and setting  at 5    -Education about use of TENS to decrease pain with movement and explanation about TENS using handout from [employer]  Overground Ambulation 10 meters x 10  -Pt reports less pain and tightness in right side of body when walking    Dead Lift with #5 KB 3 x 10  -min VC to maintain upright posture    PATIENT EDUCATION:  Education details: Form and technique for correct performance of exercise and explanation about underlying deficits.   Person educated: Patient Education method: Explanation, Demonstration, Verbal cues, and Handouts Education comprehension: verbalized understanding, returned demonstration, and verbal cues required  HOME EXERCISE PROGRAM: Access Code: [email or web address] URL: [email or web address] Date: 12/03/2023 Prepared by: Maryanne Finder  Exercises - Supine 90/90 Alternating Heel Touches with Posterior Pelvic Tilt  - 3-4 x weekly - 3 sets - 10 reps - Standing Transverse Abdominis Contraction  - 1 x daily - 7 x weekly - 1 sets - 10 reps - 3 sec hold - Modified Deadlift and squeeze stomach muscles   - 3-4 x weekly - 3 sets - 10 reps - Seated Hamstring Stretch  - 1-2 x daily - 5-7 x weekly - 3-5 reps - 30-60 seconds hold - Seated Piriformis Stretch  - 2-3 x daily - 5-7 x weekly - 3-5 reps - 30-60 seconds hold  CLINICAL IMPRESSION:   Continued PT POC focused on LBP. Session focused on BLE and lower trunk stretching as well as one core exercise. Updated HEP with the above stretches. Tolerated session with decrease in pain from 5/10 to 2/10 at end of session. She will benefit from skilled PT to address these aforementioned deficits to carry out physical activity like bending and lifting and walking required for her to maintain her home and care for herself so she can continue to remain  independent.   OBJECTIVE IMPAIRMENTS: decreased mobility, difficulty walking, hypomobility, impaired flexibility, impaired sensation, improper body mechanics, and pain.    ACTIVITY LIMITATIONS: carrying, lifting, bending, sitting, standing, squatting, and locomotion level  PARTICIPATION LIMITATIONS: cleaning, laundry, shopping, community activity, and yard work  PERSONAL FACTORS: Age, Fitness, Time since onset of injury/illness/exacerbation, and 3+ comorbidities: Major depression, HLD, HTN, T2DM are also affecting patient's functional outcome.   REHAB POTENTIAL: Good  CLINICAL DECISION MAKING: Stable/uncomplicated  EVALUATION COMPLEXITY: Low   GOALS: Goals reviewed with patient? No  SHORT TERM GOALS: Target date: 11/05/2023  Patient will demonstrate undestanding of home exercise plan by performing exercises correctly with evidence of good carry over with min to no verbal or tactile cues .   Baseline: NT 10/29/23:  Able to perform exercises independently   Goal status: ACHIEVED    2.  Patient will be able demonstrate how to correctly perform hip hinge to avoid placing increased strain on her low back when bending forward to avoid further symptom exacerbation while performing bending and lifting tasks required to maintain her home.  Baseline: Does not know how to perform  11/05/23: Patient able to perform hip hinge independently  Goal status: ACHIEVED     LONG TERM GOALS: Target date: 01/14/2024  Patient will improve modified Oswestry Disability Index (MODI) score by decreasing initial score by >=5% as evidence of the minimal statistically significant change for improvement with low back pain disability and improvement in low back function (Copay et al, 2008) Baseline:  10% or 5/50    Goal status: ONGOING    2.  Patient will decrease her right sided low back pain with physical activity like walking and standing to <=5/10 as evidence of improved lumbar function and increased function to maintain her home and remain independent  Baseline: 8/10 right sided low back pain  11/27/23: 2-3/10 NRPS right side of low back  Goal status: ONGOING   3.  Pt will  increase by at least 91m (14ft) in order to demonstrate clinically significant improvement in lumbar function and community ambulation.    Baseline: 8/10 right sided low back pain   10/29/23: 1,500 ft with no increase in her baseline pain of 7/10  11/27/23: 1-2/10 NRPS with 1,450 ft     Goal status: DEFERRED      PLAN:  PT FREQUENCY: 1-2x/week  PT DURATION: 12 weeks  PLANNED INTERVENTIONS: 97164- PT Re-evaluation, 97750- Physical Performance Testing, 97110-Therapeutic exercises, 97530- Therapeutic activity, 97112- Neuromuscular re-education, 97535- Self Care, [id number]- Manual therapy, [id number]- Gait training, [phone]- Orthotic Initial, [phone]- Canalith repositioning, H9716- Electrical stimulation (unattended), [phone]- Electrical stimulation (manual), [id number]- Traction (mechanical), 20560 (1-2 muscles), 20561 (3+ muscles)- Dry Needling, Patient/Family education, Balance training, Stair training, Taping, Joint mobilization, Joint manipulation, Spinal manipulation, Spinal mobilization, DME instructions, Cryotherapy, and Moist heat.  PLAN FOR NEXT SESSION: Reassess long term goals and to continue to use TENS with activity especially functional activities to see if patient is able to perform them better.    Maryanne Finder, PT, DPT Physical Therapist - [place]  [facility]

## 2023-12-04 ENCOUNTER — Ambulatory Visit

## 2023-12-04 DIAGNOSIS — L814 Other melanin hyperpigmentation: Secondary | ICD-10-CM

## 2023-12-04 DIAGNOSIS — D1801 Hemangioma of skin and subcutaneous tissue: Secondary | ICD-10-CM

## 2023-12-04 DIAGNOSIS — L820 Inflamed seborrheic keratosis: Secondary | ICD-10-CM | POA: Diagnosis not present

## 2023-12-04 DIAGNOSIS — L821 Other seborrheic keratosis: Secondary | ICD-10-CM

## 2023-12-04 DIAGNOSIS — L578 Other skin changes due to chronic exposure to nonionizing radiation: Secondary | ICD-10-CM

## 2023-12-04 DIAGNOSIS — W908XXA Exposure to other nonionizing radiation, initial encounter: Secondary | ICD-10-CM | POA: Diagnosis not present

## 2023-12-04 DIAGNOSIS — L918 Other hypertrophic disorders of the skin: Secondary | ICD-10-CM

## 2023-12-04 DIAGNOSIS — D229 Melanocytic nevi, unspecified: Secondary | ICD-10-CM

## 2023-12-04 DIAGNOSIS — Z1283 Encounter for screening for malignant neoplasm of skin: Secondary | ICD-10-CM

## 2023-12-04 NOTE — Patient Instructions (Addendum)

## 2023-12-04 NOTE — Progress Notes (Signed)
 Subjective   [name] is a 68 y.o. female who presents for the following: Total body skin exam for skin cancer screening and mole check. The patient has spots, moles and lesions to be evaluated, some may be new or changing and the patient may have concern these could be cancer.. Patient is new patient  Today patient reports: No areas of concern   Review of Systems:    No other skin or systemic complaints except as noted in HPI or Assessment and Plan.  The following portions of the chart were reviewed this encounter and updated as appropriate: medications, allergies, medical history  Relevant Medical History:  Family history of skin cancer - Father; unsure of type.    Objective  (SKPE) Well appearing patient in no apparent distress; mood and affect are within normal limits. Examination was performed of the: Full Skin Examination: scalp, head, eyes, ears, nose, lips, neck, chest, axillae, abdomen, back, buttocks, bilateral upper extremities, bilateral lower extremities, hands, feet, fingers, toes, fingernails, and toenails.   Examination notable for: SKIN EXAM, Angioma(s): Scattered red vascular papule(s)  , Lentigo/lentigines: Scattered pigmented macules that are tan to brown in color and are somewhat non-uniform in shape and concentrated in the sun-exposed areas, Nevus/nevi: Scattered well-demarcated, regular, pigmented macule(s) and/or papule(s)  , Seborrheic Keratosis(es): Stuck-on appearing keratotic papule(s) on the trunk, some  irritated with redness, crusting, edema, and/or partial avulsion, Actinic Damage/Elastosis: chronic sun damage: dyspigmentation, telangiectasia, and wrinkling  Examination limited by: Undergarments and Patient deferred removal     Lower abdomen, left lower extremity (3) Stuck on waxy paps with erythema  Assessment & Plan  (SKAP)   SKIN CANCER SCREENING PERFORMED TODAY.  BENIGN SKIN FINDINGS  - Lentigines  - Seborrheic keratoses  - Hemangiomas    - Nevus/Multiple Benign Nevi  - Reassurance provided regarding the benign appearance of lesions noted on exam today; no treatment is indicated in the absence of symptoms/changes. - Reinforced importance of photoprotective strategies including liberal and frequent sunscreen use of a broad-spectrum SPF 30 or greater, use of protective clothing, and sun avoidance for prevention of cutaneous malignancy and photoaging.  Counseled patient on the importance of regular self-skin monitoring as well as routine clinical skin examinations as scheduled.   ACTINIC DAMAGE - Chronic condition, secondary to cumulative UV/sun exposure - Recommend daily broad spectrum sunscreen SPF 30+ to sun-exposed areas, reapply every 2 hours as needed.  - Staying in the shade or wearing long sleeves, sun glasses (UVA+UVB protection) and wide brim hats (4-inch brim around the entire circumference of the hat) are also recommended for sun protection.  - Call for new or changing lesions.       Level of service outlined above   Patient instructions (SKPI)   Procedures, orders, diagnosis for this visit:  INFLAMED SEBORRHEIC KERATOSIS (3) Lower abdomen, left lower extremity (3) Symptomatic, irritating, patient would like treated. Destruction of lesion - Lower abdomen, left lower extremity (3) Complexity: simple   Destruction method: cryotherapy   Informed consent: discussed and consent obtained   Timeout:  patient name, date of birth, surgical site, and procedure verified Lesion destroyed using liquid nitrogen: Yes   Region frozen until ice ball extended beyond lesion: Yes   Cryo cycles: 1 or 2. Outcome: patient tolerated procedure well with no complications   Post-procedure details: wound care instructions given     Inflamed seborrheic keratosis -     Destruction of lesion    Return to clinic: Return in about  1 year (around 12/03/2024) for TBSE.  I, Emerick Ege, CMA am acting as scribe for Lauraine JAYSON Kanaris,  MD  Documentation: I have reviewed the above documentation for accuracy and completeness, and I agree with the above.  Lauraine JAYSON Kanaris, MD

## 2023-12-08 ENCOUNTER — Other Ambulatory Visit: Payer: Self-pay | Admitting: Internal Medicine

## 2023-12-08 ENCOUNTER — Encounter: Admitting: Physical Therapy

## 2023-12-09 NOTE — Telephone Encounter (Signed)
 Requested medications are due for refill today.  yes  Requested medications are on the active medications list.  yes  Last refill. 11/04/2023 2mL 0 rf  Future visit scheduled.   yes  Notes to clinic.  Medication not assigned to a protocol. Please review for refill.    Requested Prescriptions  Pending Prescriptions Disp Refills   MOUNJARO  5 MG/0.5ML Pen [Pharmacy Med Name: MOUNJARO  5 MG/0.5 ML PEN]      Sig: INJECT 5 MG SUBCUTANEOUSLY WEEKLY     Off-Protocol Failed - 12/09/2023  5:24 PM      Failed - Medication not assigned to a protocol, review manually.      Passed - Valid encounter within last 12 months    Recent Outpatient Visits           3 months ago Annual physical exam   [place] [facility] at [facility], Leita DEL, MD   6 months ago Type II diabetes mellitus with complication [facility])   [place] [facility] at [facility], Leita DEL, MD   7 months ago Rash of body   [facility] at [facility], Selinda PARAS, MD   8 months ago Osteoarthritis of left midfoot   [facility] at [facility], Selinda PARAS, MD   9 months ago Osteoarthritis of left midfoot   [facility] at [facility], Selinda PARAS, MD       Future Appointments             In 1 year Raymund, Lauraine BROCKS, MD [facility]

## 2023-12-10 ENCOUNTER — Ambulatory Visit: Admitting: Physical Therapy

## 2023-12-10 NOTE — Telephone Encounter (Signed)
 Pt called to check on status, the drug store stated that they have not received it yet. Please submit for refill and if there is a problem please call pt.

## 2023-12-15 ENCOUNTER — Encounter: Admitting: Physical Therapy

## 2023-12-17 ENCOUNTER — Ambulatory Visit: Admitting: Physical Therapy

## 2023-12-19 ENCOUNTER — Other Ambulatory Visit: Payer: Self-pay | Admitting: Medical Genetics

## 2023-12-24 ENCOUNTER — Ambulatory Visit: Admitting: Internal Medicine

## 2023-12-24 ENCOUNTER — Encounter: Payer: Self-pay | Admitting: Internal Medicine

## 2023-12-24 VITALS — BP 108/68 | HR 91 | Ht 64.5 in | Wt 187.0 lb

## 2023-12-24 DIAGNOSIS — I10 Essential (primary) hypertension: Secondary | ICD-10-CM | POA: Diagnosis not present

## 2023-12-24 DIAGNOSIS — E785 Hyperlipidemia, unspecified: Secondary | ICD-10-CM

## 2023-12-24 DIAGNOSIS — Z7985 Long-term (current) use of injectable non-insulin antidiabetic drugs: Secondary | ICD-10-CM

## 2023-12-24 DIAGNOSIS — E118 Type 2 diabetes mellitus with unspecified complications: Secondary | ICD-10-CM

## 2023-12-24 DIAGNOSIS — E1169 Type 2 diabetes mellitus with other specified complication: Secondary | ICD-10-CM | POA: Diagnosis not present

## 2023-12-24 DIAGNOSIS — K219 Gastro-esophageal reflux disease without esophagitis: Secondary | ICD-10-CM | POA: Diagnosis not present

## 2023-12-24 LAB — POCT GLYCOSYLATED HEMOGLOBIN (HGB A1C): Hemoglobin A1C: 5.4 % (ref 4.0–5.6)

## 2023-12-24 MED ORDER — PRAVASTATIN SODIUM 10 MG PO TABS: 10.0000 mg | ORAL_TABLET | Freq: Every day | ORAL | 1 refills | Status: AC

## 2023-12-24 MED ORDER — FAMOTIDINE 20 MG PO TABS: 20.0000 mg | ORAL_TABLET | Freq: Two times a day (BID) | ORAL | 1 refills | Status: AC

## 2023-12-24 MED ORDER — NEBIVOLOL HCL 5 MG PO TABS: 5.0000 mg | ORAL_TABLET | Freq: Every day | ORAL | 1 refills | Status: AC

## 2023-12-24 NOTE — Assessment & Plan Note (Addendum)
 Currently medications are Mounjaro , Farxiga .  No hypoglycemic episodes noted. Home blood sugars in the 130 range.  Now tolerating mounjaro  5 mg. Last visit medical regimen changes were none. Lab Results  Component Value Date   HGBA1C 5.4 12/24/2023  Will continue the same regimen.

## 2023-12-24 NOTE — Assessment & Plan Note (Signed)
 Currently taking pepcid  with minimal reflux symptoms. Patient denies red flag symptoms - no melena, weight loss, dysphagia. Will maintain current management.

## 2023-12-24 NOTE — Progress Notes (Signed)
 Date:  12/24/2023   Name:  [name]   DOB:  1955/10/25   MRN:  [id number]   Chief Complaint: Medical Management of Chronic Issues (DM and HTN)  Diabetes She presents for her follow-up diabetic visit. She has type 2 diabetes mellitus. Her disease course has been improving. Pertinent negatives for hypoglycemia include no dizziness, headaches or nervousness/anxiousness. Pertinent negatives for diabetes include no chest pain, no fatigue and no weakness. Current diabetic treatments: Mounjaro , Farxiga . She is compliant with treatment all of the time. There is no change in her home blood glucose trend.    Review of Systems  Constitutional:  Negative for fatigue and unexpected weight change.  HENT:  Negative for trouble swallowing.   Eyes:  Negative for visual disturbance.  Respiratory:  Negative for cough, chest tightness, shortness of breath and wheezing.   Cardiovascular:  Negative for chest pain, palpitations and leg swelling.  Gastrointestinal:  Negative for abdominal pain, constipation and diarrhea.  Genitourinary:  Negative for dysuria and hematuria.  Musculoskeletal:  Negative for arthralgias and myalgias.  Neurological:  Positive for numbness (neuropathy in feet). Negative for dizziness, weakness, light-headedness and headaches.  Hematological:  Does not bruise/bleed easily.  Psychiatric/Behavioral:  Positive for sleep disturbance. Negative for dysphoric mood. The patient is not nervous/anxious.      Lab Results  Component Value Date   NA 142 08/20/2023   K 4.8 08/20/2023   CO2 22 08/20/2023   GLUCOSE 101 (H) 08/20/2023   BUN 13 08/20/2023   CREATININE 0.60 08/20/2023   CALCIUM 9.5 08/20/2023   EGFR 98 08/20/2023   GFRNONAA >60 10/11/2020   Lab Results  Component Value Date   CHOL 195 08/20/2023   HDL 45 08/20/2023   LDLCALC 124 (H) 08/20/2023   LDLDIRECT 161.1 11/08/2010   TRIG 144 08/20/2023   CHOLHDL 4.3 08/20/2023   Lab Results  Component Value Date    TSH 1.580 08/20/2023   Lab Results  Component Value Date   HGBA1C 5.4 12/24/2023   Lab Results  Component Value Date   WBC 7.8 08/20/2023   HGB 12.3 08/20/2023   HCT 41.0 08/20/2023   MCV 82 08/20/2023   PLT 347 08/20/2023   Lab Results  Component Value Date   ALT 24 08/20/2023   AST 24 08/20/2023   ALKPHOS 146 (H) 08/20/2023   BILITOT 0.2 08/20/2023   Lab Results  Component Value Date   VD25OH 35.8 08/20/2023     Patient Active Problem List   Diagnosis Date Noted   Gastroesophageal reflux disease without esophagitis 12/24/2023   Lumbar radiculitis 08/20/2023   Diabetic peripheral neuropathy (HCC) 05/21/2023   Osteoarthritis of left midfoot 03/05/2023   Peroneal tendinitis, left 03/05/2023   Adenomatous polyp of colon 06/20/2022   Screening for colon cancer 06/20/2022   Anemia 11/08/2021   Rash of body 09/28/2020   S/P hysterectomy 02/09/2020   Moderate episode of recurrent major depressive disorder (HCC) 02/09/2020   Neuropathy 07/30/2019   Osteopenia determined by x-ray 04/26/2019   Hyperlipidemia associated with type 2 diabetes mellitus (HCC) 07/15/2017   Type II diabetes mellitus with complication (HCC) 04/18/2017   Vitamin D  deficiency 04/18/2017   Obesity 02/28/2015   Hemorrhoids 03/04/2012   Insomnia 03/04/2012   Arthralgia 03/04/2012   Migraine without aura or status migrainosus 03/04/2012   Arthralgia of right temporomandibular joint 03/04/2012   Essential hypertension 11/08/2010    Allergies  Allergen Reactions   Amlodipine  Swelling    And headache  Past Surgical History:  Procedure Laterality Date   ABDOMINAL HYSTERECTOMY  2008   for Menorrhagia   BREAST REDUCTION SURGERY     COLONOSCOPY     COLONOSCOPY WITH PROPOFOL  N/A 06/20/2022   Procedure: COLONOSCOPY WITH PROPOFOL ;  Surgeon: Therisa Bi, MD;  Location: [facility] ENDOSCOPY;  Service: Gastroenterology;  Laterality: N/A;   COSMETIC SURGERY     LAPAROSCOPY     OPEN REDUCTION INTERNAL  FIXATION (ORIF) DISTAL RADIAL FRACTURE Right 10/12/2020   Procedure: OPEN REDUCTION INTERNAL FIXATION (ORIF) DISTAL RADIAL FRACTURE;  Surgeon: Kathlynn Sharper, MD;  Location: [facility] ORS;  Service: Orthopedics;  Laterality: Right;   REDUCTION MAMMAPLASTY Bilateral 1991   TUBAL LIGATION      Social History   Tobacco Use   Smoking status: Never   Smokeless tobacco: Never  Vaping Use   Vaping status: Never Used  Substance Use Topics   Alcohol use: Not Currently    Alcohol/week: 2.0 standard drinks of alcohol    Types: 2 Glasses of wine per week   Drug use: Never     Medication list has been reviewed and updated.  Current Meds  Medication Sig   Blood Glucose Monitoring Suppl DEVI Use to test blood sugar twice daily.   Cholecalciferol 25 MCG (1000 UT) TBDP Take 1 tablet by mouth daily.   dapagliflozin  propanediol (FARXIGA ) 10 MG TABS tablet Take 1 tablet (10 mg total) by mouth daily.   DULoxetine  (CYMBALTA ) 60 MG capsule Take 2 capsules (120 mg total) by mouth daily.   famotidine  (PEPCID ) 20 MG tablet Take 1 tablet (20 mg total) by mouth 2 (two) times daily.   Glucose Blood (BLOOD GLUCOSE TEST STRIPS) STRP Use to test blood sugar twice daily.   glucose blood (ONETOUCH ULTRA) test strip Use to test blood sugar twice a day   Insulin  Pen Needle (PEN NEEDLES) 31G X 5 MM MISC 1 each by Does not apply route once a week.   Lancet Device MISC Use to test blood sugar twice daily.   losartan -hydrochlorothiazide (HYZAAR) 100-12.5 MG tablet Take 1 tablet by mouth daily.   tirzepatide  (MOUNJARO ) 5 MG/0.5ML Pen INJECT 5 MG SUBCUTANEOUSLY WEEKLY   vitamin B-12 (CYANOCOBALAMIN ) 1000 MCG tablet Take 1,000 mcg by mouth daily.   zolpidem  (AMBIEN ) 10 MG tablet Take 1 tablet (10 mg total) by mouth at bedtime as needed for sleep.   [DISCONTINUED] Continuous Glucose Sensor (FREESTYLE LIBRE 3 PLUS SENSOR) MISC Place 1 sensor on the skin every 15 days. Use to check glucose continuously   [DISCONTINUED] nebivolol   (BYSTOLIC ) 5 MG tablet Take 1 tablet (5 mg total) by mouth daily.   [DISCONTINUED] pravastatin  (PRAVACHOL ) 10 MG tablet Take 1 tablet (10 mg total) by mouth daily.       12/24/2023   11:03 AM 08/20/2023   10:37 AM 05/21/2023    3:02 PM 02/12/2023    1:34 PM  GAD 7 : Generalized Anxiety Score  Nervous, Anxious, on Edge 0 1 1 1   Control/stop worrying 0 0 0 1  Worry too much - different things 0 0 1 1  Trouble relaxing 0 2 1 0  Restless 0 0 0 0  Easily annoyed or irritable 1 2 1 2   Afraid - awful might happen 0 0 0 0  Total GAD 7 Score 1 5 4 5   Anxiety Difficulty Not difficult at all Not difficult at all Not difficult at all Somewhat difficult       12/24/2023   11:02 AM 09/10/2023  4:04 PM 08/20/2023   10:36 AM  Depression screen PHQ 2/9  Decreased Interest 0 0 1  Down, Depressed, Hopeless 0 0 1  PHQ - 2 Score 0 0 2  Altered sleeping 0 0 3  Tired, decreased energy 3 0 3  Change in appetite 0 0 0  Feeling bad or failure about yourself  0 0 0  Trouble concentrating 0 0 1  Moving slowly or fidgety/restless 0 0 0  Suicidal thoughts 0 0 0  PHQ-9 Score 3 0  9   Difficult doing work/chores Not difficult at all Not difficult at all Somewhat difficult     Data saved with a previous flowsheet row definition    BP Readings from Last 3 Encounters:  12/24/23 108/68  09/10/23 122/78  08/20/23 122/78    Physical Exam Vitals and nursing note reviewed.  Constitutional:      General: She is not in acute distress.    Appearance: She is well-developed.  HENT:     Head: Normocephalic and atraumatic.  Neck:     Vascular: No carotid bruit.  Cardiovascular:     Rate and Rhythm: Normal rate and regular rhythm.     Heart sounds: No murmur heard. Pulmonary:     Effort: Pulmonary effort is normal. No respiratory distress.     Breath sounds: No wheezing or rhonchi.  Musculoskeletal:     Cervical back: Normal range of motion.     Right lower leg: No edema.     Left lower leg: No  edema.  Skin:    General: Skin is warm and dry.     Capillary Refill: Capillary refill takes less than 2 seconds.     Findings: No rash.  Neurological:     General: No focal deficit present.     Mental Status: She is alert and oriented to person, place, and time.  Psychiatric:        Mood and Affect: Mood normal.        Behavior: Behavior normal.     Wt Readings from Last 3 Encounters:  12/24/23 187 lb (84.8 kg)  09/10/23 182 lb (82.6 kg)  08/20/23 184 lb (83.5 kg)    BP 108/68   Pulse 91   Ht 5' 4.5 (1.638 m)   Wt 187 lb (84.8 kg)   SpO2 99%   BMI 31.60 kg/m   Assessment and Plan:  Problem List Items Addressed This Visit       Unprioritized   Essential hypertension (Chronic)   Well controlled blood pressure today. Current regimen is losartan , hydrochlorothiazide and Bystolic . No medication side effects noted.        Relevant Medications   pravastatin  (PRAVACHOL ) 10 MG tablet   nebivolol  (BYSTOLIC ) 5 MG tablet   Type II diabetes mellitus with complication (HCC) - Primary (Chronic)   Currently medications are Mounjaro , Farxiga .  No hypoglycemic episodes noted. Home blood sugars in the 130 range.  Now tolerating mounjaro  5 mg. Last visit medical regimen changes were none. Lab Results  Component Value Date   HGBA1C 5.4 12/24/2023  Will continue the same regimen.       Relevant Medications   pravastatin  (PRAVACHOL ) 10 MG tablet   Other Relevant Orders   POCT glycosylated hemoglobin (Hb A1C) (Completed)   Hyperlipidemia associated with type 2 diabetes mellitus (HCC) (Chronic)   Now taking Crestor three times per week and tolerating it well. Will check lipids annually unless there is a change      Relevant  Medications   pravastatin  (PRAVACHOL ) 10 MG tablet   nebivolol  (BYSTOLIC ) 5 MG tablet   Gastroesophageal reflux disease without esophagitis   Currently taking pepcid  with minimal reflux symptoms. Patient denies red flag symptoms - no melena, weight  loss, dysphagia. Will maintain current management.       Relevant Medications   famotidine  (PEPCID ) 20 MG tablet   Other Visit Diagnoses       Long-term current use of injectable noninsulin antidiabetic medication           No follow-ups on file.    Leita HILARIO Adie, MD [facility] [place]

## 2023-12-24 NOTE — Assessment & Plan Note (Signed)
 Well controlled blood pressure today. Current regimen is losartan , hydrochlorothiazide and Bystolic . No medication side effects noted.

## 2023-12-24 NOTE — Assessment & Plan Note (Signed)
 Now taking Crestor three times per week and tolerating it well. Will check lipids annually unless there is a change

## 2023-12-31 ENCOUNTER — Encounter: Admitting: Physical Therapy

## 2023-12-31 ENCOUNTER — Other Ambulatory Visit: Payer: Self-pay | Admitting: Pharmacist

## 2023-12-31 ENCOUNTER — Encounter: Payer: Self-pay | Admitting: Pharmacist

## 2023-12-31 MED ORDER — MOUNJARO 5 MG/0.5ML ~~LOC~~ SOAJ: 5.0000 mg | SUBCUTANEOUS | 1 refills | Status: DC

## 2023-12-31 NOTE — Progress Notes (Signed)
   12/31/2023  Patient ID: [name] Anon, female   DOB: [birth year], 68 y.o.   MRN: [id number]  This patient is appearing on a report for being at risk of failing the adherence measure for diabetes medications this calendar year.   Medication: Mounjaro  5 mg Last fill date: 12/10/2023 for 28 day supply  Insurance report was not up to date. No action needed at this time.   Note current prescription is out of refills. Will send renewal request to PCP.  Sharyle Sia, PharmD, [facility] [phone]

## 2023-12-31 NOTE — Progress Notes (Signed)
 Per latest office visit note, patient to continue Mounjaro  5 mg weekly. Patient in need of renewal as latest Rx is out of refills. Rx pended and routed to prescriber.  Thank you!  Sharyle Sia, PharmD, [facility] [phone]

## 2024-01-07 ENCOUNTER — Encounter: Admitting: Physical Therapy

## 2024-01-07 DIAGNOSIS — M5416 Radiculopathy, lumbar region: Secondary | ICD-10-CM | POA: Diagnosis not present

## 2024-01-07 DIAGNOSIS — M5441 Lumbago with sciatica, right side: Secondary | ICD-10-CM | POA: Diagnosis not present

## 2024-01-07 DIAGNOSIS — G8929 Other chronic pain: Secondary | ICD-10-CM | POA: Diagnosis not present

## 2024-01-08 ENCOUNTER — Other Ambulatory Visit: Payer: Self-pay | Admitting: Internal Medicine

## 2024-01-12 ENCOUNTER — Other Ambulatory Visit: Payer: Self-pay | Admitting: Internal Medicine

## 2024-01-12 ENCOUNTER — Telehealth: Payer: Self-pay

## 2024-01-12 DIAGNOSIS — E118 Type 2 diabetes mellitus with unspecified complications: Secondary | ICD-10-CM

## 2024-01-12 MED ORDER — MOUNJARO 5 MG/0.5ML ~~LOC~~ SOAJ: 5.0000 mg | SUBCUTANEOUS | 1 refills | Status: AC

## 2024-01-12 NOTE — Progress Notes (Unsigned)
 Date:  01/12/2024   Name:  [name]   DOB:  11-02-1955   MRN:  [id number]   Chief Complaint: No chief complaint on file.  HPI  Review of Systems   Lab Results  Component Value Date   NA 142 08/20/2023   K 4.8 08/20/2023   CO2 22 08/20/2023   GLUCOSE 101 (H) 08/20/2023   BUN 13 08/20/2023   CREATININE 0.60 08/20/2023   CALCIUM 9.5 08/20/2023   EGFR 98 08/20/2023   GFRNONAA >60 10/11/2020   Lab Results  Component Value Date   CHOL 195 08/20/2023   HDL 45 08/20/2023   LDLCALC 124 (H) 08/20/2023   LDLDIRECT 161.1 11/08/2010   TRIG 144 08/20/2023   CHOLHDL 4.3 08/20/2023   Lab Results  Component Value Date   TSH 1.580 08/20/2023   Lab Results  Component Value Date   HGBA1C 5.4 12/24/2023   Lab Results  Component Value Date   WBC 7.8 08/20/2023   HGB 12.3 08/20/2023   HCT 41.0 08/20/2023   MCV 82 08/20/2023   PLT 347 08/20/2023   Lab Results  Component Value Date   ALT 24 08/20/2023   AST 24 08/20/2023   ALKPHOS 146 (H) 08/20/2023   BILITOT 0.2 08/20/2023   Lab Results  Component Value Date   VD25OH 35.8 08/20/2023     Patient Active Problem List   Diagnosis Date Noted   Gastroesophageal reflux disease without esophagitis 12/24/2023   Lumbar radiculitis 08/20/2023   Diabetic peripheral neuropathy (HCC) 05/21/2023   Osteoarthritis of left midfoot 03/05/2023   Peroneal tendinitis, left 03/05/2023   Adenomatous polyp of colon 06/20/2022   Screening for colon cancer 06/20/2022   Anemia 11/08/2021   Rash of body 09/28/2020   S/P hysterectomy 02/09/2020   Moderate episode of recurrent major depressive disorder (HCC) 02/09/2020   Neuropathy 07/30/2019   Osteopenia determined by x-ray 04/26/2019   Hyperlipidemia associated with type 2 diabetes mellitus (HCC) 07/15/2017   Type II diabetes mellitus with complication (HCC) 04/18/2017   Vitamin D  deficiency 04/18/2017   Obesity 02/28/2015   Hemorrhoids 03/04/2012   Insomnia 03/04/2012    Arthralgia 03/04/2012   Migraine without aura or status migrainosus 03/04/2012   Arthralgia of right temporomandibular joint 03/04/2012   Essential hypertension 11/08/2010    Allergies[1]  Past Surgical History:  Procedure Laterality Date   ABDOMINAL HYSTERECTOMY  2008   for Menorrhagia   BREAST REDUCTION SURGERY     COLONOSCOPY     COLONOSCOPY WITH PROPOFOL  N/A 06/20/2022   Procedure: COLONOSCOPY WITH PROPOFOL ;  Surgeon: Therisa Bi, MD;  Location: [facility] ENDOSCOPY;  Service: Gastroenterology;  Laterality: N/A;   COSMETIC SURGERY     LAPAROSCOPY     OPEN REDUCTION INTERNAL FIXATION (ORIF) DISTAL RADIAL FRACTURE Right 10/12/2020   Procedure: OPEN REDUCTION INTERNAL FIXATION (ORIF) DISTAL RADIAL FRACTURE;  Surgeon: Kathlynn Sharper, MD;  Location: [facility] ORS;  Service: Orthopedics;  Laterality: Right;   REDUCTION MAMMAPLASTY Bilateral 1991   TUBAL LIGATION      Social History[2]   Medication list has been reviewed and updated.  Active Medications[3]     12/24/2023   11:03 AM 08/20/2023   10:37 AM 05/21/2023    3:02 PM 02/12/2023    1:34 PM  GAD 7 : Generalized Anxiety Score  Nervous, Anxious, on Edge 0 1 1 1   Control/stop worrying 0 0 0 1  Worry too much - different things 0 0 1 1  Trouble relaxing 0 2 1  0  Restless 0 0 0 0  Easily annoyed or irritable 1 2 1 2   Afraid - awful might happen 0 0 0 0  Total GAD 7 Score 1 5 4 5   Anxiety Difficulty Not difficult at all Not difficult at all Not difficult at all Somewhat difficult       12/24/2023   11:02 AM 09/10/2023    4:04 PM 08/20/2023   10:36 AM  Depression screen PHQ 2/9  Decreased Interest 0 0 1  Down, Depressed, Hopeless 0 0 1  PHQ - 2 Score 0 0 2  Altered sleeping 0 0 3  Tired, decreased energy 3 0 3  Change in appetite 0 0 0  Feeling bad or failure about yourself  0 0 0  Trouble concentrating 0 0 1  Moving slowly or fidgety/restless 0 0 0  Suicidal thoughts 0 0 0  PHQ-9 Score 3 0  9   Difficult doing  work/chores Not difficult at all Not difficult at all Somewhat difficult     Data saved with a previous flowsheet row definition    BP Readings from Last 3 Encounters:  12/24/23 108/68  09/10/23 122/78  08/20/23 122/78    Physical Exam  Wt Readings from Last 3 Encounters:  12/24/23 187 lb (84.8 kg)  09/10/23 182 lb (82.6 kg)  08/20/23 184 lb (83.5 kg)    There were no vitals taken for this visit.  Assessment and Plan:  Problem List Items Addressed This Visit   None   No follow-ups on file.    Leita HILARIO Adie, MD [facility] [place]           [1]  Allergies Allergen Reactions   Amlodipine  Swelling    And headache  [2]  Social History Tobacco Use   Smoking status: Never   Smokeless tobacco: Never  Vaping Use   Vaping status: Never Used  Substance Use Topics   Alcohol use: Not Currently    Alcohol/week: 2.0 standard drinks of alcohol    Types: 2 Glasses of wine per week   Drug use: Never  [3]  No outpatient medications have been marked as taking for the 01/12/24 encounter (Orders Only) with Adie Leita DEL, MD.

## 2024-01-12 NOTE — Telephone Encounter (Signed)
 Please review.  KP

## 2024-01-12 NOTE — Telephone Encounter (Signed)
 Copied from CRM [id number]. Topic: Clinical - Medication Question >> Jan 12, 2024  8:09 AM Thersia BROCKS wrote: Reason for RMF:[id number] called I regarding her prescription  tirzepatide  (MOUNJARO ) 5 MG/0.5ML Pen, patient stated she was suppose to take it yesterday, so needs it refilled as soon as possible

## 2024-01-14 ENCOUNTER — Encounter: Admitting: Physical Therapy

## 2024-01-21 ENCOUNTER — Encounter
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
## 2024-01-28 ENCOUNTER — Other Ambulatory Visit: Payer: Self-pay | Admitting: Pharmacist

## 2024-01-28 DIAGNOSIS — E118 Type 2 diabetes mellitus with unspecified complications: Secondary | ICD-10-CM

## 2024-01-28 NOTE — Progress Notes (Signed)
 "  01/28/2024 Name: [name] MRN: [id number] DOB: [birth year]  Chief Complaint  Patient presents with   Medication Management   Medication Adherence    [name] is a 68 y.o. year old female who presented for a telephone visit.   They were referred to the pharmacist by a quality report for assistance in managing diabetes and medication access.    Subjective:   Care Team: Primary Care Provider: Justus Leita DEL, MD/Liang, Harlene, MD ; Next Scheduled Visit: 04/28/2024    Medication Access/Adherence  Current Pharmacy:  [facility] - [place], [place] - [address] [place] [address] Phone: [phone] Fax: [phone]  [id number] - [place], [place] - [address] [place] [address] Phone: [phone] Fax: [phone]   Patient reports affordability concerns with their medications: No  Patient reports access/transportation concerns to their pharmacy: No  Patient reports adherence concerns with their medications:  No    Patient signed up for [employer] (HMO C-SNP) for 2026  Diabetes:   Current medications:  - Farxiga  10 mg daily before breakfast - Mounjaro  5 mg weekly    Medications tried in the past: metformin  (unable to tolerate); Ozempic  (nausea/GI symptoms)    Reports last checked blood sugar: yesterday evening, reading ~147    Reports limiting sugary beverage (ginger ale) intake   Statin therapy: pravastatin  10 mg daily - Reports tolerating increase to now taking daily well (increased to daily ~1 month ago)   Current physical activity: limited by back pain; has upcoming appointment with surgeon to discuss possible back surgery. Currently stays active around the home   Hypertension:  Current medications:  - losartan -HCTZ 100-12.5 mg daily - nebivolol  5 mg daily   Patient has an automated, upper arm home BP cuff Denies monitoring at home recently  Patient denies  hypotensive s/sx including dizziness, lightheadedness.   Current physical activity: limited by back pain; patient has upcoming appointment with surgeon to discuss possible back surgery. Currently stays active around the home    Objective:  Lab Results  Component Value Date   HGBA1C 5.4 12/24/2023    Lab Results  Component Value Date   CREATININE 0.60 08/20/2023   BUN 13 08/20/2023   NA 142 08/20/2023   K 4.8 08/20/2023   CL 105 08/20/2023   CO2 22 08/20/2023    Lab Results  Component Value Date   CHOL 195 08/20/2023   HDL 45 08/20/2023   LDLCALC 124 (H) 08/20/2023   LDLDIRECT 161.1 11/08/2010   TRIG 144 08/20/2023   CHOLHDL 4.3 08/20/2023   BP Readings from Last 3 Encounters:  12/24/23 108/68  09/10/23 122/78  08/20/23 122/78   Pulse Readings from Last 3 Encounters:  12/24/23 91  08/20/23 (!) 57  05/21/23 84     Medications Reviewed Today     Reviewed by Alana Sharyle LABOR, RPH-CPP (Pharmacist) on 01/28/24 at [phone]  Med List Status: <None>   Medication Order Taking? Sig Documenting Provider Last Dose Status Informant  Blood Glucose Monitoring Suppl DEVI [id number]  Use to test blood sugar twice daily. Justus Leita DEL, MD  Active   Cholecalciferol 25 MCG (1000 UT) TBDP [id number]  Take 1 tablet by mouth daily. [provider]  Active   dapagliflozin  propanediol (FARXIGA ) 10 MG TABS tablet [id number] Yes Take 1 tablet (10 mg total) by mouth daily. Justus Leita DEL, MD  Active  DULoxetine  (CYMBALTA ) 60 MG capsule [id number]  Take 2 capsules (120 mg total) by mouth daily. Justus Leita DEL, MD  Active   famotidine  (PEPCID ) 20 MG tablet [id number]  Take 1 tablet (20 mg total) by mouth 2 (two) times daily. Justus Leita DEL, MD  Active   Glucose Blood (BLOOD GLUCOSE TEST STRIPS) STRP [id number]  Use to test blood sugar twice daily. Justus Leita DEL, MD  Active   glucose blood [facility] ULTRA) test strip [id number]  Use to test blood sugar twice a day Justus Leita DEL, MD  Active Self  Insulin  Pen Needle (PEN NEEDLES) 31G X 5 MM MISC [id number]  1 each by Does not apply route once a week. Justus Leita DEL, MD  Active   Lancet Device MISC [id number]  Use to test blood sugar twice daily. Justus Leita DEL, MD  Active   losartan -hydrochlorothiazide (HYZAAR) 100-12.5 MG tablet [id number] Yes Take 1 tablet by mouth daily. Justus Leita DEL, MD  Active   nebivolol  (BYSTOLIC ) 5 MG tablet [id number] Yes Take 1 tablet (5 mg total) by mouth daily. Justus Leita DEL, MD  Active   pravastatin  (PRAVACHOL ) 10 MG tablet [id number]  Take 1 tablet (10 mg total) by mouth daily. Justus Leita DEL, MD  Active   tirzepatide  (MOUNJARO ) 5 MG/0.5ML Pen [id number] Yes Inject 5 mg into the skin once a week. Justus Leita DEL, MD  Active   vitamin B-12 (CYANOCOBALAMIN ) 1000 MCG tablet [id number]  Take 1,000 mcg by mouth daily. [provider]  Active Self  zolpidem  (AMBIEN ) 10 MG tablet [id number]  Take 1 tablet (10 mg total) by mouth at bedtime as needed for sleep. Justus Leita DEL, MD  Active               Assessment/Plan:   Review with patient that per [employer] website, the [employer] (HMO C-SNP) plan has a $300 annual prescription deductible (impacting only tiers 4-5) and tier 6 copayment of $0 (for medications such as Mounjaro  and Farxiga ) in 2026. Note out of pocket prescription drug cost in 2026 is capped at $2,100. - Note as cost of Farxiga  will now be affordable to her through her Medicare plan next year. Denies need to apply for re-enrollment in [place] patient assistance - Will ask PCP to send renewal to local pharmacy for patient  Diabetes: - Reviewed goal A1c, goal fasting, and goal 2 hour post prandial glucose - Reviewed dietary modifications including encourage patient to have regular well-balanced meals and snacks, while controlling carbohydrate portion sizes Encourage patient to continue to review nutrition  labels for the total carbohydrate content of foods - Recommend to continue to check glucose, keep log of results and have this record to review for future medical appointments    - Discuss importance of cholesterol control/ASCVD risk reduction. Recommend patient to continue taking pravastatin  10 mg daily   Hypertension: - Currently controlled - Recommend to check glucose, keep log of results and have this record to review for future medical appointments     Follow Up Plan: Clinical Pharmacist will follow up with patient by telephone on 03/31/2024 at 10:00 AM     Sharyle Sia, PharmD, [facility] [phone]   "

## 2024-01-28 NOTE — Patient Instructions (Signed)
Goals Addressed             This Visit's Progress    Pharmacy Goals       Our goal A1c is less than 7%. This corresponds with fasting sugars less than 130 and 2 hour after meal sugars less than 180. Please keep a log of your results when checking your blood sugar   Our goal bad cholesterol, or LDL, is less than 70. This is why it is important to continue taking your pravastatin   Thank you!  Estelle Grumbles, PharmD, [facility] [phone]

## 2024-01-30 ENCOUNTER — Other Ambulatory Visit: Payer: Self-pay | Admitting: Student

## 2024-01-30 MED ORDER — DAPAGLIFLOZIN PROPANEDIOL 10 MG PO TABS: 10.0000 mg | ORAL_TABLET | Freq: Every day | ORAL | 1 refills | Status: AC

## 2024-02-03 ENCOUNTER — Inpatient Hospital Stay
Admission: RE | Admit: 2024-02-03 | Discharge: 2024-02-03 | Disposition: A | Payer: Self-pay | Source: Ambulatory Visit | Attending: Neurosurgery | Admitting: Neurosurgery

## 2024-02-03 ENCOUNTER — Other Ambulatory Visit: Payer: Self-pay | Admitting: Family Medicine

## 2024-02-03 DIAGNOSIS — Z049 Encounter for examination and observation for unspecified reason: Secondary | ICD-10-CM

## 2024-02-04 ENCOUNTER — Ambulatory Visit: Admitting: Neurosurgery

## 2024-02-04 VITALS — BP 100/64 | Wt 183.8 lb

## 2024-02-04 DIAGNOSIS — M4150 Other secondary scoliosis, site unspecified: Secondary | ICD-10-CM

## 2024-02-04 DIAGNOSIS — G8929 Other chronic pain: Secondary | ICD-10-CM

## 2024-02-04 DIAGNOSIS — M4306 Spondylolysis, lumbar region: Secondary | ICD-10-CM | POA: Insufficient documentation

## 2024-02-04 DIAGNOSIS — M4316 Spondylolisthesis, lumbar region: Secondary | ICD-10-CM | POA: Diagnosis not present

## 2024-02-10 ENCOUNTER — Telehealth: Payer: Self-pay

## 2024-02-10 NOTE — Telephone Encounter (Signed)
 Copied from CRM [id number]. Topic: General - Other >> Feb 10, 2024 12:24 PM Selinda RAMAN wrote: Reason for CRM: Bartley a representative with [employer] called stating they are the supplier for the patients Farxiga  and that she needs to confirm the patient is still being seen at this location and by what provider as the patient has been getting this medication for free. Please call [phone] and use reference #[id number]

## 2024-02-10 NOTE — Telephone Encounter (Signed)
 Called gave confirmation that pt is still being seen at our office.  KP

## 2024-02-23 NOTE — Telephone Encounter (Signed)
 Gave pt a call to follow up on mail out pap [employer] (Farxiga ) left a HIPAA VM.

## 2024-02-24 ENCOUNTER — Other Ambulatory Visit: Payer: Self-pay | Admitting: Internal Medicine

## 2024-02-24 DIAGNOSIS — F5101 Primary insomnia: Secondary | ICD-10-CM

## 2024-02-24 MED ORDER — ZOLPIDEM TARTRATE 10 MG PO TABS: 10.0000 mg | ORAL_TABLET | Freq: Every evening | ORAL | 3 refills | Status: AC | PRN

## 2024-02-24 NOTE — Telephone Encounter (Signed)
 Copied from CRM [id number]. Topic: Clinical - Medication Refill >> Feb 24, 2024  9:12 AM Joesph B wrote: Medication:  zolpidem  (AMBIEN ) 10 MG tablet   Has the patient contacted their pharmacy? Yes (Agent: If no, request that the patient contact the pharmacy for the refill. If patient does not wish to contact the pharmacy document the reason why and proceed with request.) (Agent: If yes, when and what did the pharmacy advise?)  This is the patient's preferred pharmacy:  [facility] - [place], [place] - [address] [place] [address] Phone: [phone] Fax: [phone]   Is this the correct pharmacy for this prescription? Yes If no, delete pharmacy and type the correct one.   Has the prescription been filled recently? Yes  Is the patient out of the medication? No  Has the patient been seen for an appointment in the last year OR does the patient have an upcoming appointment? Yes  Can we respond through MyChart? Yes  Agent: Please be advised that Rx refills may take up to 3 business days. We ask that you follow-up with your pharmacy.

## 2024-02-24 NOTE — Telephone Encounter (Signed)
 Please review.  KP

## 2024-03-31 ENCOUNTER — Other Ambulatory Visit: Admitting: Pharmacist

## 2024-04-28 ENCOUNTER — Ambulatory Visit: Admitting: Student

## 2024-05-05 ENCOUNTER — Ambulatory Visit

## 2024-12-08 ENCOUNTER — Ambulatory Visit
